# Patient Record
Sex: Female | Born: 1980 | Race: Black or African American | Hispanic: No | Marital: Single | Smoking: Former smoker
Health system: Southern US, Community
[De-identification: ages and names within clinical notes are randomized; demographics above are authoritative.]

## PROBLEM LIST (undated history)

## (undated) DIAGNOSIS — I10 Essential (primary) hypertension: Secondary | ICD-10-CM

## (undated) DIAGNOSIS — E785 Hyperlipidemia, unspecified: Secondary | ICD-10-CM

## (undated) DIAGNOSIS — G35 Multiple sclerosis: Secondary | ICD-10-CM

## (undated) DIAGNOSIS — E119 Type 2 diabetes mellitus without complications: Secondary | ICD-10-CM

## (undated) DIAGNOSIS — Z973 Presence of spectacles and contact lenses: Secondary | ICD-10-CM

## (undated) HISTORY — DX: Multiple sclerosis: G35

## (undated) HISTORY — PX: EYE SURGERY: SHX253

## (undated) HISTORY — DX: Essential (primary) hypertension: I10

---

## 2009-01-05 HISTORY — PX: EYE SURGERY: SHX253

## 2014-04-16 ENCOUNTER — Emergency Department (HOSPITAL_COMMUNITY)
Admission: EM | Admit: 2014-04-16 | Discharge: 2014-04-16 | Disposition: A | Discharge status: Home / Self Care | Payer: Medicaid Other | Source: Home / Self Care | Attending: Emergency Medicine | Admitting: Emergency Medicine

## 2014-04-16 ENCOUNTER — Encounter (HOSPITAL_COMMUNITY): Payer: Self-pay | Admitting: Emergency Medicine

## 2014-04-16 DIAGNOSIS — J111 Influenza due to unidentified influenza virus with other respiratory manifestations: Secondary | ICD-10-CM | POA: Diagnosis not present

## 2014-04-16 MED ORDER — OSELTAMIVIR PHOSPHATE 75 MG PO CAPS: 75.0000 mg | ORAL_CAPSULE | Freq: Two times a day (BID) | ORAL | Status: DC

## 2014-04-16 MED ORDER — ACETAMINOPHEN 325 MG PO TABS
650.0000 mg | ORAL_TABLET | Freq: Once | ORAL | Status: AC
  Administered 2014-04-16: 650 mg via ORAL

## 2014-04-16 MED ORDER — ACETAMINOPHEN 325 MG PO TABS
ORAL_TABLET | ORAL | Status: AC
  Filled 2014-04-16: qty 2

## 2014-04-16 NOTE — ED Notes (Signed)
Pt. Stated, It started yesterday with congestion, I thought it was just my sinuses.

## 2014-04-16 NOTE — Discharge Instructions (Signed)
You have the flu. Take Tamiflu twice a day for the next 5 days. Make sure you're getting plenty of fluids and rest. Alternate Tylenol and Motrin every 4 hours as needed for body aches and fever. Follow-up as needed.

## 2014-04-16 NOTE — ED Provider Notes (Signed)
CSN: [id number]     Arrival date & time 04/16/14  1719 History   First MD Initiated Contact with Patient 04/16/14 [place]     Chief Complaint  Patient presents with  . Influenza   (Consider location/radiation/quality/duration/timing/severity/associated sxs/prior Treatment) HPI  She is a 34 year old woman here for evaluation of fever. Her symptoms started yesterday with nasal congestion and rhinorrhea. Today, she has had fever, body aches, sore throat, cough, fatigue. She's had some mild nausea and one episode of vomiting this morning. No abdominal pain or diarrhea. Positive sick contacts.  History reviewed. No pertinent past medical history. History reviewed. No pertinent past surgical history. No family history on file. History  Substance Use Topics  . Smoking status: Never Smoker   . Smokeless tobacco: Not on file  . Alcohol Use: No   OB History    No data available     Review of Systems  Constitutional: Positive for fever and fatigue.  HENT: Positive for congestion, ear pain, rhinorrhea and sore throat.   Respiratory: Positive for cough. Negative for shortness of breath.   Cardiovascular: Negative for chest pain.  Gastrointestinal: Positive for nausea and vomiting. Negative for abdominal pain and diarrhea.  Musculoskeletal: Positive for myalgias.  Neurological: Positive for headaches.    Allergies  Review of patient's allergies indicates not on file.  Home Medications   Prior to Admission medications   Medication Sig Start Date End Date Taking? Authorizing Provider  oseltamivir (TAMIFLU) 75 MG capsule Take 1 capsule (75 mg total) by mouth every 12 (twelve) hours. 04/16/14   Melony Overly, MD   BP 145/84 mmHg  Pulse 116  Temp(Src) 101.9 F (38.8 C) (Oral)  Resp 20  SpO2 97%  LMP 04/04/2014 Physical Exam  Constitutional: She is oriented to person, place, and time. She appears well-developed and well-nourished. She appears distressed (appears fatigued).  HENT:  Right  Ear: Tympanic membrane normal.  Left Ear: Tympanic membrane normal.  Nose: Rhinorrhea present.  Mouth/Throat: Oropharynx is clear and moist. No oropharyngeal exudate.  Neck: Neck supple.  Cardiovascular: Regular rhythm and normal heart sounds.  Tachycardia present.   No murmur heard. Pulmonary/Chest: Effort normal and breath sounds normal. No respiratory distress. She has no wheezes. She has no rales.  Lymphadenopathy:    She has no cervical adenopathy.  Neurological: She is alert and oriented to person, place, and time.    ED Course  Procedures (including critical care time) Labs Review Labs Reviewed - No data to display  Imaging Review No results found.   MDM   1. Influenza    Tylenol 650 mg given for fever.  We'll treat with Tamiflu. Symptomatic care as in after visit summary. Follow-up as needed.    Melony Overly, MD 04/16/14 [phone]

## 2015-10-03 ENCOUNTER — Other Ambulatory Visit: Payer: Self-pay | Admitting: Family

## 2015-10-03 DIAGNOSIS — R29898 Other symptoms and signs involving the musculoskeletal system: Secondary | ICD-10-CM

## 2015-10-03 DIAGNOSIS — G8929 Other chronic pain: Secondary | ICD-10-CM

## 2015-10-03 DIAGNOSIS — M5441 Lumbago with sciatica, right side: Secondary | ICD-10-CM

## 2015-10-22 ENCOUNTER — Ambulatory Visit
Admission: RE | Admit: 2015-10-22 | Discharge: 2015-10-22 | Disposition: A | Discharge status: Home / Self Care | Payer: 59 | Source: Ambulatory Visit | Attending: Family | Admitting: Family

## 2015-10-22 DIAGNOSIS — M5441 Lumbago with sciatica, right side: Secondary | ICD-10-CM

## 2015-10-22 DIAGNOSIS — G8929 Other chronic pain: Secondary | ICD-10-CM

## 2015-10-22 DIAGNOSIS — R29898 Other symptoms and signs involving the musculoskeletal system: Secondary | ICD-10-CM

## 2016-01-31 ENCOUNTER — Encounter: Payer: Self-pay | Admitting: Neurology

## 2016-01-31 ENCOUNTER — Ambulatory Visit (INDEPENDENT_AMBULATORY_CARE_PROVIDER_SITE_OTHER): Payer: 59 | Admitting: Neurology

## 2016-01-31 ENCOUNTER — Other Ambulatory Visit: Payer: 59

## 2016-01-31 VITALS — BP 128/88 | HR 96 | Ht 63.0 in | Wt 197.4 lb

## 2016-01-31 DIAGNOSIS — R29898 Other symptoms and signs involving the musculoskeletal system: Secondary | ICD-10-CM | POA: Diagnosis not present

## 2016-01-31 DIAGNOSIS — R9082 White matter disease, unspecified: Secondary | ICD-10-CM

## 2016-01-31 DIAGNOSIS — R252 Cramp and spasm: Secondary | ICD-10-CM

## 2016-01-31 DIAGNOSIS — R292 Abnormal reflex: Secondary | ICD-10-CM | POA: Diagnosis not present

## 2016-01-31 DIAGNOSIS — R930 Abnormal findings on diagnostic imaging of skull and head, not elsewhere classified: Secondary | ICD-10-CM

## 2016-01-31 MED ORDER — BACLOFEN 10 MG PO TABS: 10.0000 mg | ORAL_TABLET | Freq: Two times a day (BID) | ORAL | 5 refills | Status: DC

## 2016-01-31 NOTE — Patient Instructions (Addendum)
1.  MRI cervical and brain wwo contrast 2.  Check blood work 3.  Start physical therapy  4.  Start baclofen 10mg  as follows:    Morning       Evening  Week 1          1/2 tab                                   Week 2 1/2 tab         1/2 tab                         Week 3 1/2 tab         1 tab                       Continue 1 tab            1/ tab                           Return to clinic in 2-3 months

## 2016-01-31 NOTE — Progress Notes (Signed)
[facility] Clinic Note - Initial Visit   Date: 01/31/16  Peggy Banks MRN: [id number] DOB: [birth year]   Dear Eloise Levels, NP:  Thank you for your kind referral of Peggy Banks for consultation of right leg weakness. Although her history is well known to you, please allow [place] to reiterate it for the purpose of our medical record. The patient was accompanied to the clinic by self.   History of Present Illness: Peggy Banks is a 36 y.o. right-handed female with hypertension presenting for evaluation of right leg stiffness and gait imbalance.    For the past 10 years, she has had a limp with her right leg and feels that is more effortful for her to move and often slaps the floor when she is walking.  Her leg also feels stiff and heavy.  She does not have muscle cramps.  There is numbness over the sole of the foot and involves the posterior lower calf.  Symptoms are worse with prolonged sitting.  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  Stretching sometimes helps.   Over the past 6 months, she became more dependent on using grocery carts because it helps maintain balance when she walks.  She is able to walk unassisted, but feels more comfortable leaning on objects.  She has also noticed that right toes on the right foot do not move and her foot tends to want to curl inwards.    She recalls having right radicular pain over her thigh during her pregnancy with her son, but her current symptoms are nothing like this. She endorses low back pain.  MRI lumbar spine did not show any nerve impingement.  She denies any numbness/tingling or weakness of the right face and arm.   No history of vision loss.    Upon further questioning, she was told ~2005 that there was abnormal change on her MRI for the same complaints and multiple sclerosis was mentioned, but she was never given the diagnosis.  She did not have CSF  testing.  She did see neurology for this but after she became pregnant, she was lost to follow-up.   Out-side paper records, electronic medical record, and images have been reviewed where available and summarized as:  MRI lumbar spine wo contrast 10/22/2015: 1. No stenosis or impingement to explain right-sided leg symptoms. 2. L4-5 degenerative facet arthropathy.  Past Medical History:  Diagnosis Date  . Hypertension     Past Surgical History:  Procedure Laterality Date  . CESAREAN SECTION       Medications:  Outpatient Encounter Prescriptions as of 01/31/2016  Medication Sig  . Cholecalciferol (VITAMIN D PO) Take 5,000 Units by mouth.  . Ferrous Gluconate-C-Folic Acid (IRON-C PO) Take by mouth.  Marland Kitchen lisinopril-hydrochlorothiazide (PRINZIDE,ZESTORETIC) 10-12.5 MG tablet Take 1 tablet by mouth daily.  . Multiple Vitamin (MULTIVITAMIN) tablet Take 1 tablet by mouth daily.  Marland Kitchen oseltamivir (TAMIFLU) 75 MG capsule Take 1 capsule (75 mg total) by mouth every 12 (twelve) hours.   No facility-administered encounter medications on file as of 01/31/2016.      Allergies:  Allergies  Allergen Reactions  . Penicillins     Family History: Family History  Problem Relation Age of Onset  . Hypothyroidism Mother   . Hypertension Mother   . Hypertension Father   . Healthy Sister   . Cancer Maternal Grandfather   . Stroke Paternal Grandmother   . Other  Paternal Grandfather     MVA    Social History: Social History  Substance Use Topics  . Smoking status: Never Smoker  . Smokeless tobacco: Never Used  . Alcohol use No     Comment: Occasional   Social History   Social History Narrative  . No narrative on file    Review of Systems:  CONSTITUTIONAL: No fevers, chills, night sweats, or weight loss.   EYES: No visual changes or eye pain ENT: No hearing changes.  No history of nose bleeds.   RESPIRATORY: No cough, wheezing and shortness of breath.   CARDIOVASCULAR: Negative for  chest pain, and palpitations.   GI: Negative for abdominal discomfort, blood in stools or black stools.  No recent change in bowel habits.   GU:  No history of incontinence.   MUSCLOSKELETAL: No history of joint pain or swelling.  No myalgias.   SKIN: Negative for lesions, rash, and itching.   HEMATOLOGY/ONCOLOGY: Negative for prolonged bleeding, bruising easily, and swollen nodes.  No history of cancer.   ENDOCRINE: Negative for cold or heat intolerance, polydipsia or goiter.   PSYCH:  No depression or anxiety symptoms.   NEURO: As Above.   Vital Signs:  BP 128/88   Pulse 96   Ht '5\' 3"'  (1.6 m)   Wt 197 lb 6 oz (89.5 kg)   SpO2 98%   BMI 34.96 kg/m    General Medical Exam:   General:  Well appearing, comfortable.   Eyes/ENT: see cranial nerve examination.   Neck: No masses appreciated.  Full range of motion without tenderness.  No carotid bruits. Respiratory:  Clear to auscultation, good air entry bilaterally.   Cardiac:  Regular rate and rhythm, no murmur.   Extremities:  Mild inversion of the right foot and toe flexion due to spasticity.  Skin:  No rashes or lesions.  Neurological Exam: MENTAL STATUS including orientation to time, place, person, recent and remote memory, attention span and concentration, language, and fund of knowledge is normal.  Speech is not dysarthric.  CRANIAL NERVES: II:  No visual field defects.  Unremarkable fundi.   III-IV-VI: Pupils equal round and reactive to light.  Normal conjugate, extra-ocular eye movements in all directions of gaze.  No nystagmus.  No ptosis.  V:  Normal facial sensation.  Jaw jerk is absent.   VII:  Normal facial symmetry and movements.  No pathologic facial reflexes.  VIII:  Normal hearing and vestibular function.   IX-X:  Normal palatal movement.   XI:  Normal shoulder shrug and head rotation.   XII:  Normal tongue strength and range of motion, no deviation or fasciculation.  MOTOR:  No atrophy, fasciculations or  abnormal movements.  No pronator drift.  Right foot with spastic deformity causing foot flexion, toe flexion, and inversion.  Right Upper Extremity:    Left Upper Extremity:    Deltoid  5/5   Deltoid  5/5   Biceps  5/5   Biceps  5/5   Triceps  5/5   Triceps  5/5   Wrist extensors  5/5   Wrist extensors  5/5   Wrist flexors  5/5   Wrist flexors  5/5   Finger extensors  5/5   Finger extensors  5/5   Finger flexors  5/5   Finger flexors  5/5   Dorsal interossei  5/5   Dorsal interossei  5/5   Abductor pollicis  5/5   Abductor pollicis  5/5   Tone (Ashworth scale)  0+  Tone (Ashworth scale)  0   Right Lower Extremity:    Left Lower Extremity:    Hip flexors  5/5   Hip flexors  5/5   Hip extensors  5/5   Hip extensors  5/5   Knee flexors  5/5   Knee flexors  5/5   Knee extensors  5/5   Knee extensors  5/5   Inversion 5/5  Inversion 5/5  Eversion 2/5  Eversion 5/5  Dorsiflexors  5/5   Dorsiflexors  5/5   Plantarflexors  5/5   Plantarflexors  5/5   Toe extensors  1/5   Toe extensors  5/5   Toe flexors  1/5   Toe flexors  5/5   Tone (Ashworth scale)  1+  Tone (Ashworth scale)  1+   MSRs:  Right                                                                 Left brachioradialis 3+  brachioradialis 2+  biceps 3+  biceps 2+  triceps 3+  triceps 2+  patellar 3+  patellar 3+  ankle jerk 3+  ankle jerk 2+  Hoffman yes  Hoffman no  plantar response down  plantar response down  1-2 beat ankle clonus  SENSORY:  Vibration is absent at the toes bilaterally.  Temperature is reduced over the dorsum of the feet.  Pin prick is intact.   COORDINATION/GAIT: Normal finger-to- nose-finger.  Intact rapid alternating movements bilaterally with finger tapping.  Heel and toe tapping on the right is slowed.  Spastic gait with right foot in flexion and mildly inverted and minimal knee flexion causing circumduction of the leg.     IMPRESSION: Right leg spasticity due to likely demyelinating  disease/multiple sclerosis.  She has previously been told that her MRI brain (2005) shows demyelinating plaques, however, I do not have these reports or images to review. Patient had a copy of this at home and will bring for me to review personally.  With her increased tone and hyperreflexia in the RUE, the possibility of multiple sclerosis is a high likelihood.  MRI brain and cervical spine wwo contrast will be ordered to assess disease burden and exclude other etiologies.  From a symptom standpoint, she needs management for spasticity.  I will try muscle relaxants and PT first, if no improvement, we will discuss chemodenervation with Botox.   PLAN/RECOMMENDATIONS:  1.  MRI brain and cervical spine wwo contrast 2.  Check ESR, vitamin D, ANA 3.  Start physical therapy for right leg stretching 4.  Start baclofen 51m at bedtime and titrate to 16mtwice daily.  Side effects discussed and titration schedule provided.  Return to clinic in 2 months.   The duration of this appointment visit was 60 minutes of face-to-face time with the patient.  Greater than 50% of this time was spent in counseling, explanation of diagnosis, planning of further management, and coordination of care.   Thank you for allowing me to participate in patient's care.  If I can answer any additional questions, I would be pleased to do so.    Sincerely,    Donika K. PaPosey ProntoDO

## 2016-01-31 NOTE — Addendum Note (Signed)
Addended by: Chester Holstein on: 01/31/2016 02:47 PM   Modules accepted: Orders

## 2016-02-01 LAB — ANA: ANA: NEGATIVE

## 2016-02-01 LAB — VITAMIN D 25 HYDROXY (VIT D DEFICIENCY, FRACTURES): Vit D, 25-Hydroxy: 41.8 ng/mL (ref 30.0–100.0)

## 2016-02-01 LAB — SEDIMENTATION RATE: SED RATE: 41 mm/h — AB (ref 0–32)

## 2016-02-03 ENCOUNTER — Telehealth: Payer: Self-pay | Admitting: *Deleted

## 2016-02-03 NOTE — Telephone Encounter (Signed)
Patient given results

## 2016-02-03 NOTE — Telephone Encounter (Signed)
-----   Message from Alda Berthold, DO sent at 02/03/2016  9:17 AM EST ----- Please inform patient that her inflammatory marker is mildly elevated. This is a very nonspecific finding can be seen in various autoimmune/inflammatory conditions.  Remains labs are normal.  We will await the results of her MRI and decide the next step.  Thanks.

## 2016-02-05 ENCOUNTER — Telehealth: Payer: Self-pay | Admitting: Neurology

## 2016-02-05 NOTE — Telephone Encounter (Signed)
Peggy Banks [birth year]. Her # [id number]. She is wanting know if labs were sent to [employer] (she works for them). She also dropped off MRI disc. That is her only one. She would like it back at her next visit please. Thank you

## 2016-02-06 NOTE — Telephone Encounter (Signed)
See previous telephone encounter (1/29) for lab results.  The labs were probably sent to [facility] unless she specifically mentioned that they needed to go to [place] - please look into this.  MRI disc is ready for pick-up or we can mail it, if she wants it sooner than her next appointment.

## 2016-02-06 NOTE — Telephone Encounter (Signed)
Lab results please

## 2016-02-06 NOTE — Telephone Encounter (Signed)
MRI brain personally reviewed dated 02/23/2006 which shows extensive scatter T2 hyperintensities in the subcortical and periventricular regions, with a larger confluent area on the left frontal horn and right posterior horn of the lateral ventricle.  There is also involvement of the corpus callosum. There is small foci of enhancement involving the subcortical parito-occiptal region.  These findings are most suggestive of multiple sclerosis.  Repeat MRI brian is pending and will guide options for disease modifying therapy.  Donika K. Posey Pronto, DO

## 2016-02-07 ENCOUNTER — Ambulatory Visit
Admission: RE | Admit: 2016-02-07 | Discharge: 2016-02-07 | Disposition: A | Discharge status: Home / Self Care | Payer: 59 | Source: Ambulatory Visit | Attending: Neurology | Admitting: Neurology

## 2016-02-07 DIAGNOSIS — R252 Cramp and spasm: Secondary | ICD-10-CM

## 2016-02-07 DIAGNOSIS — R29898 Other symptoms and signs involving the musculoskeletal system: Secondary | ICD-10-CM

## 2016-02-07 DIAGNOSIS — R9082 White matter disease, unspecified: Secondary | ICD-10-CM

## 2016-02-07 DIAGNOSIS — R292 Abnormal reflex: Secondary | ICD-10-CM

## 2016-02-07 MED ORDER — GADOBENATE DIMEGLUMINE 529 MG/ML IV SOLN
20.0000 mL | Freq: Once | INTRAVENOUS | Status: AC | PRN
  Administered 2016-02-07: 18 mL via INTRAVENOUS

## 2016-02-10 ENCOUNTER — Telehealth: Payer: Self-pay | Admitting: Neurology

## 2016-02-10 NOTE — Telephone Encounter (Signed)
Called and informed patient that her MRI brain and cervical spine shows extensive white matter changes, most consistent with multiple sclerosis.  There are four foci of enhancement.  Diagnosis was discussed and acute management options.  I will treat her with Solumedrol 1g x 3 days for acute exacerbation.  Side effects discussed.    We will discuss management options for disease-modifying therapies as well as spasticity management at her follow-up visit with me.   Donika K. Posey Pronto, DO

## 2016-02-11 ENCOUNTER — Other Ambulatory Visit: Payer: Self-pay | Admitting: *Deleted

## 2016-02-11 ENCOUNTER — Telehealth: Payer: Self-pay | Admitting: Neurology

## 2016-02-11 DIAGNOSIS — G35 Multiple sclerosis: Secondary | ICD-10-CM

## 2016-02-11 NOTE — Telephone Encounter (Signed)
Patient given # to short stay to reschedule.

## 2016-02-11 NOTE — Telephone Encounter (Signed)
Called patient to let her know that I have set up the steroid infusions for Monday, 02-17-16 at 12:00.

## 2016-02-11 NOTE — Telephone Encounter (Signed)
Peggy Banks September 12, 1980. She has a schedule conflict and needs it for wed, thurs, Friday for her steroid treatments. Next week she has a lot going on with her son and his appointments. Her # [id number]. Thank you

## 2016-02-13 ENCOUNTER — Telehealth: Payer: Self-pay | Admitting: Neurology

## 2016-02-13 NOTE — Telephone Encounter (Signed)
Mitsue Balasubramanian 1980-06-12 She was calling to check on her paper work that she dropped off yesterday 02/12/16. Her # is [phone] Thank you

## 2016-02-14 NOTE — Telephone Encounter (Signed)
Patient notified papers are ready.  I faxed them in and will mail the originals to her.

## 2016-02-17 ENCOUNTER — Encounter (HOSPITAL_COMMUNITY): Payer: 59

## 2016-02-18 ENCOUNTER — Other Ambulatory Visit (HOSPITAL_COMMUNITY): Payer: Self-pay | Admitting: *Deleted

## 2016-02-18 ENCOUNTER — Encounter (HOSPITAL_COMMUNITY): Payer: 59

## 2016-02-19 ENCOUNTER — Ambulatory Visit (HOSPITAL_COMMUNITY)
Admission: RE | Admit: 2016-02-19 | Discharge: 2016-02-19 | Disposition: A | Discharge status: Home / Self Care | Payer: 59 | Source: Ambulatory Visit | Attending: Neurology | Admitting: Neurology

## 2016-02-19 DIAGNOSIS — I10 Essential (primary) hypertension: Secondary | ICD-10-CM | POA: Insufficient documentation

## 2016-02-19 DIAGNOSIS — G35 Multiple sclerosis: Secondary | ICD-10-CM | POA: Insufficient documentation

## 2016-02-19 MED ORDER — METHYLPREDNISOLONE SODIUM SUCC 1000 MG IJ SOLR
1000.0000 mg | Freq: Every day | INTRAMUSCULAR | Status: DC
  Filled 2016-02-19: qty 8

## 2016-02-19 MED ORDER — SODIUM CHLORIDE 0.9 % IV SOLN
1000.0000 mg | Freq: Once | INTRAVENOUS | Status: AC
  Administered 2016-02-19: 1000 mg via INTRAVENOUS
  Filled 2016-02-19: qty 8

## 2016-02-19 NOTE — Discharge Instructions (Signed)
Methylprednisolone Solution for Injection What is this medicine? METHYLPREDNISOLONE (meth ill pred NISS oh lone) is a corticosteroid. It is commonly used to treat inflammation of the skin, joints, lungs, and other organs. Common conditions treated include asthma, allergies, and arthritis. It is also used for other conditions, such as blood disorders and diseases of the adrenal glands. This medicine may be used for other purposes; ask your health care provider or pharmacist if you have questions. COMMON BRAND NAME(S): A-Methapred, Solu-Medrol What should I tell my health care provider before I take this medicine? They need to know if you have any of these conditions: -Cushing's syndrome -eye disease, vision problems -diabetes -glaucoma -heart disease -high blood pressure -infection (especially a virus infection such as chickenpox, cold sores, or herpes) -liver disease -mental illness -myasthenia gravis -osteoporosis -recently received or scheduled to receive a vaccine -seizures -stomach or intestine problems -thyroid disease -an unusual or allergic reaction to lactose, methylprednisolone, other medicines, foods, dyes, or preservatives -pregnant or trying to get pregnant -breast-feeding How should I use this medicine? This medicine is for injection or infusion into a vein. It is also for injection into a muscle. It is given by a health care professional in a hospital or clinic setting. Talk to your pediatrician regarding the use of this medicine in children. While this drug may be prescribed for selected conditions, precautions do apply. Overdosage: If you think you have taken too much of this medicine contact a poison control center or emergency room at once. NOTE: This medicine is only for you. Do not share this medicine with others. What if I miss a dose? This does not apply. What may interact with this medicine? Do not take this medicine with any of the following  medications: -alefacept -echinacea -iopamidol -live virus vaccines -metyrapone -mifepristone This medicine may also interact with the following medications: -amphotericin B -aspirin and aspirin-like medicines -certain antibiotics like erythromycin, clarithromycin, troleandomycin -certain medicines for diabetes -certain medicines for fungal infection like ketoconazole -certain medicines for seizures like carbamazepine, phenobarbital, phenytoin -certain medicines that treat or prevent blood clots like warfarin -cyclosporine -digoxin -diuretics -female hormones, like estrogens and birth control pills -isoniazid -NSAIDS, medicines for pain and inflammation, like ibuprofen or naproxen -other medicines for myasthenia gravis -rifampin -vaccines This list may not describe all possible interactions. Give your health care provider a list of all the medicines, herbs, non-prescription drugs, or dietary supplements you use. Also tell them if you smoke, drink alcohol, or use illegal drugs. Some items may interact with your medicine. What should I watch for while using this medicine? Tell your doctor or healthcare professional if your symptoms do not start to get better or if they get worse. Do not stop taking except on your doctor's advice. You may develop a severe reaction. Your doctor will tell you how much medicine to take. Your condition will be monitored carefully while you are receiving this medicine. This medicine may increase your risk of getting an infection. Tell your doctor or health care professional if you are around anyone with measles or chickenpox, or if you develop sores or blisters that do not heal properly. This medicine may affect blood sugar levels. If you have diabetes, check with your doctor or health care professional before you change your diet or the dose of your diabetic medicine. Tell your doctor or health care professional right away if you have any change in your  eyesight. Using this medicine for a long time may increase your risk of low bone  mass. Talk to your doctor about bone health. What side effects may I notice from receiving this medicine? Side effects that you should report to your doctor or health care professional as soon as possible: -allergic reactions like skin rash, itching or hives, swelling of the face, lips, or tongue -bloody or tarry stools -changes in vision -hallucination, loss of contact with reality -muscle cramps -muscle pain -palpitations -signs and symptoms of high blood sugar such as dizziness; dry mouth; dry skin; fruity breath; nausea; stomach pain; increased hunger or thirst; increased urination -signs and symptoms of infection like fever or chills; cough; sore throat; pain or trouble passing urine -trouble passing urine or change in the amount of urine Side effects that usually do not require medical attention (report to your doctor or health care professional if they continue or are bothersome): -changes in emotions or mood -constipation -diarrhea -excessive hair growth on the face or body -headache -nausea, vomiting -pain, redness, or irritation at site where injected -trouble sleeping -weight gain This list may not describe all possible side effects. Call your doctor for medical advice about side effects. You may report side effects to FDA at 1-800-FDA-1088. Where should I keep my medicine? This drug is given in a hospital or clinic and will not be stored at home. NOTE: This sheet is a summary. It may not cover all possible information. If you have questions about this medicine, talk to your doctor, pharmacist, or health care provider.  2017 Elsevier/Gold Standard (2015-02-28 16:21:28)

## 2016-02-20 ENCOUNTER — Encounter (HOSPITAL_COMMUNITY)
Admission: RE | Admit: 2016-02-20 | Discharge: 2016-02-20 | Disposition: A | Discharge status: Home / Self Care | Payer: 59 | Source: Ambulatory Visit | Attending: Neurology | Admitting: Neurology

## 2016-02-20 DIAGNOSIS — I10 Essential (primary) hypertension: Secondary | ICD-10-CM | POA: Diagnosis not present

## 2016-02-20 DIAGNOSIS — G35 Multiple sclerosis: Secondary | ICD-10-CM | POA: Insufficient documentation

## 2016-02-20 MED ORDER — SODIUM CHLORIDE 0.9 % IV SOLN
1000.0000 mg | Freq: Once | INTRAVENOUS | Status: DC
  Administered 2016-02-20: 1000 mg via INTRAVENOUS
  Filled 2016-02-20: qty 8

## 2016-02-21 ENCOUNTER — Ambulatory Visit (HOSPITAL_COMMUNITY)
Admission: RE | Admit: 2016-02-21 | Discharge: 2016-02-21 | Disposition: A | Discharge status: Home / Self Care | Payer: 59 | Source: Ambulatory Visit | Attending: Neurology | Admitting: Neurology

## 2016-02-21 DIAGNOSIS — G35 Multiple sclerosis: Secondary | ICD-10-CM | POA: Insufficient documentation

## 2016-02-21 MED ORDER — SODIUM CHLORIDE 0.9 % IV SOLN
1000.0000 mg | Freq: Once | INTRAVENOUS | Status: AC
  Administered 2016-02-21: 1000 mg via INTRAVENOUS
  Filled 2016-02-21: qty 8

## 2016-02-27 ENCOUNTER — Encounter: Payer: Self-pay | Admitting: Neurology

## 2016-02-27 ENCOUNTER — Ambulatory Visit (INDEPENDENT_AMBULATORY_CARE_PROVIDER_SITE_OTHER): Payer: 59 | Admitting: Neurology

## 2016-02-27 VITALS — BP 120/80 | HR 86 | Ht 63.0 in | Wt 195.4 lb

## 2016-02-27 DIAGNOSIS — G35 Multiple sclerosis: Secondary | ICD-10-CM

## 2016-02-27 DIAGNOSIS — G35A Relapsing-remitting multiple sclerosis: Secondary | ICD-10-CM | POA: Insufficient documentation

## 2016-02-27 NOTE — Patient Instructions (Addendum)
1.  Please review information on Gilenya and Tysabri 2.  Continue baclofen 10mg  twice daily 3.  MRI thoracic spine wwo contrast 4.  NCS/EMG right arm 5.  Continue vitamin D 5000 units 6.  Please have your eyes checked  Return to clinic in 4 weeks

## 2016-02-27 NOTE — Progress Notes (Signed)
Follow-up Visit   Date: 02/27/16    Jacobi Scorza MRN: [id number] DOB: Jul 08, 1980   Interim History: [place] Kosar is a 36 y.o. right-handed African American female with hypertension returning to the clinic for follow-up of newly diagnosed multiple sclerosis.  The patient was accompanied to the clinic by self.    History of present illness: For the past 10 years, she has had a limp with her right leg and feels that is more effortful for her to move and often slaps the floor when she is walking.  Her leg also feels stiff and heavy.  She does not have muscle cramps.  There is numbness over the sole of the foot and involves the posterior lower calf.  Symptoms are worse with prolonged sitting.  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  Stretching sometimes helps.   Over the past 6 months, she became more dependent on using grocery carts because it helps maintain balance when she walks.  She is able to walk unassisted, but feels more comfortable leaning on objects.  She has also noticed that right toes on the right foot do not move and her foot tends to want to curl inwards.    She recalls having right radicular pain over her thigh during her pregnancy with her son, but her current symptoms are nothing like this. She endorses low back pain.  MRI lumbar spine did not show any nerve impingement.  She denies any numbness/tingling or weakness of the right face and arm.   No history of vision loss.    Upon further questioning, she was told ~2005 that there was abnormal change on her MRI for the same complaints and multiple sclerosis was mentioned, but she was never given the diagnosis.  She did not have CSF testing.  She did see neurology for this but after she became pregnant, many of her symptoms improve, and she was lost to follow-up.   UPDATE 02/27/2016:  Patient had MRI brain and cervical spine which showed diffuse white matter changes  affecting the brain as well as cervical cord, consistent with multiple sclerosis.  There are 4 small foci of enhancement. She was treated with 3 days of Solu-Medrol, but did not notice any marked change. She does mention having changes in her taste for a week following her infusion.  Upon further questioning about her history of neurological symptoms, she feels that her symptoms have always come and gone such as tingling of the legs and right hand.  She spends a lot of time at the computer for and is concerned her right hand tingling may be stemming from carpal tunnel syndrome. She has been tested for this. Since starting baclofen, her muscle stiffness and cramps have reduced, especially in the right leg.  Unfortunately, her gait remains unchanged and is still unsteady, with dragging of the right foot.  She also complains of tight band sensation around her rib cage and into her abdomen. She denies any shooting pain with neck flexion.  Medications:   Current Outpatient Prescriptions on File Prior to Visit  Medication Sig Dispense Refill  . baclofen (LIORESAL) 10 MG tablet Take 1 tablet (10 mg total) by mouth 2 (two) times daily. 60 tablet 5  . Cholecalciferol (VITAMIN D PO) Take 5,000 Units by mouth.    . Ferrous Gluconate-C-Folic Acid (IRON-C PO) Take by mouth.    Marland Kitchen lisinopril-hydrochlorothiazide (PRINZIDE,ZESTORETIC) 10-12.5 MG tablet Take 1  tablet by mouth daily.    . Multiple Vitamin (MULTIVITAMIN) tablet Take 1 tablet by mouth daily.    Marland Kitchen oseltamivir (TAMIFLU) 75 MG capsule Take 1 capsule (75 mg total) by mouth every 12 (twelve) hours. 10 capsule 0   No current facility-administered medications on file prior to visit.     Allergies:  Allergies  Allergen Reactions  . Penicillins     Review of Systems:  CONSTITUTIONAL: No fevers, chills, night sweats, or weight loss.  EYES: No visual changes or eye pain ENT: No hearing changes.  No history of nose bleeds.   RESPIRATORY: No cough, wheezing  and shortness of breath.   CARDIOVASCULAR: Negative for chest pain, and palpitations.   GI: Negative for abdominal discomfort, blood in stools or black stools.  No recent change in bowel habits.   GU:  No history of incontinence.   MUSCLOSKELETAL: No history of joint pain or swelling.  No myalgias.   SKIN: Negative for lesions, rash, and itching.   ENDOCRINE: Negative for cold or heat intolerance, polydipsia or goiter.   PSYCH:  No depression or anxiety symptoms.   NEURO: As Above.   Vital Signs:  BP 120/80   Pulse 86   Ht 5\' 3"  (1.6 m)   Wt 195 lb 6 oz (88.6 kg)   SpO2 98%   BMI 34.61 kg/m   Neurological Exam: MENTAL STATUS including orientation to time, place, person, recent and remote memory, attention span and concentration, language, and fund of knowledge is normal.  Speech is not dysarthric.  CRANIAL NERVES: No visual field defects.  Pupils equal round and reactive to light.  Normal conjugate, extra-ocular eye movements in all directions of gaze.  No ptosis. Normal facial sensation.  Face is symmetric. Palate elevates symmetrically.  Tongue is midline.  MOTOR:  Motor strength is 5/5 in all extremities, except right eversion 2/5, toe extension and flexion 1/5.   There is right Achilles tendon contracture causing foot flexion, toe flexion, and inversion.  I am unable to appreciate much spasticity in the foot.   tone is normal in the right upper extremity (improved); although spasticity in the left leg is improved (0+), the right leg continues to be spastic (1+).  MSRs:  Right                                                                 Left brachioradialis 3+  brachioradialis 2+  biceps 3+  biceps 2+  triceps 3+  triceps 2+  patellar 3+  patellar 3+  ankle jerk 3+  ankle jerk 2+  Hoffman yes  Hoffman no  plantar response down  plantar response down  No clonus (improved)  SENSORY: Vibration is absent at the toes bilaterally  COORDINATION/GAIT:  Normal finger-to-  nose-finger. Spastic gait with right foot in flexion and mildly inverted and hyperextension of the knee.  She walks unassisted.   Data: MRI brain personally reviewed dated 02/23/2006 which shows extensive scatter T2 hyperintensities in the subcortical and periventricular regions, with a larger confluent area on the left frontal horn and right posterior horn of the lateral ventricle.  There is also involvement of the corpus callosum. There is small foci of enhancement involving the subcortical parito-occiptal region.  MRI brain and cervical spine with  and without contrast 02/07/2016:  Findings consistent with multiple sclerosis. Disease is extensive throughout the brain and cervical cord and there is white matter atrophy. Four foci of enhancement/active demyelination are noted.   IMPRESSION/PLAN: Mrs. Durocher is a 36 year old female returning for follow-up of newly diagnosed multiple sclerosis. Symptom onset was in 2005 with right leg spasticity and gait difficulties, however she was lost to follow-up and did not undergo neurological evaluation at that time. She started seeing me in January 2018 for these complaints and imaging confirmed multiple sclerosis with disease burden involving juxtacortical, periventricular, brainstem, and cerebellum as well as short segment demyelinating changes affecting nearly every level of the cervical spine.   I had an extensive discussion regarding the diagnosis of relapsing remitting MS. However, with the extensive nature of her disease progression, progressive MS and not be excluded. I would like to treat her with disease modifying therapies for relapsing remitting multiple sclerosis to prevent further neurological disability. With her cervical cord involvement, I aggressive therapy with Tysabri. Alternatively, oral agents were also discussed.  She was informed of the risks and benefits of these medications, including the rare risk of PML. Literature on these medications was  provided to the patient. I will allow her to review this data and follow up with her in 2 weeks to initiate medical treatment.  1.  Information on Gilenya and Tysabri provided 2.  Continue baclofen 10mg  twice daily 3.  MRI thoracic spine wwo contrast 4.  NCS/EMG right arm to evaluate for CTS 5.  Start physical therapy for right knee hyperextension and right foot spasticity.  6   Continue vitamin D 5000 units 7.  Recommend eye exam 8.  Follow-up in 2 weeks with decision on disease modifying therapy  Return to clinic in 4-6 weeks.    The duration of this appointment visit was 45 minutes of face-to-face time with the patient.  Greater than 50% of this time was spent in counseling, explanation of diagnosis, planning of further management, and coordination of care.   Thank you for allowing me to participate in patient's care.  If I can answer any additional questions, I would be pleased to do so.    Sincerely,    Koni Kannan K. Posey Pronto, DO

## 2016-03-10 ENCOUNTER — Ambulatory Visit
Admission: RE | Admit: 2016-03-10 | Discharge: 2016-03-10 | Disposition: A | Discharge status: Home / Self Care | Payer: 59 | Source: Ambulatory Visit | Attending: Neurology | Admitting: Neurology

## 2016-03-10 DIAGNOSIS — G35 Multiple sclerosis: Secondary | ICD-10-CM

## 2016-03-10 MED ORDER — GADOBENATE DIMEGLUMINE 529 MG/ML IV SOLN
18.0000 mL | Freq: Once | INTRAVENOUS | Status: AC | PRN
  Administered 2016-03-10: 18 mL via INTRAVENOUS

## 2016-03-13 ENCOUNTER — Telehealth: Payer: Self-pay | Admitting: *Deleted

## 2016-03-13 NOTE — Telephone Encounter (Signed)
-----   Message from Alda Berthold, DO sent at 03/13/2016  2:44 PM EST ----- Please inform patient that there is evidence of multiple sclerosis affecting the thoracic cord which explains her band like sensation over the abdomen.  Did you have time to review the information on Tysabri?  If she would like to proceed, we will need to complete paperwork at her next visit on 3/23.

## 2016-03-13 NOTE — Telephone Encounter (Signed)
I gave patient the results and she has agreed to start tysabri.  We will have her sign papers at next visit.

## 2016-03-27 ENCOUNTER — Other Ambulatory Visit: Payer: 59

## 2016-03-27 ENCOUNTER — Encounter: Payer: Self-pay | Admitting: Neurology

## 2016-03-27 ENCOUNTER — Ambulatory Visit (INDEPENDENT_AMBULATORY_CARE_PROVIDER_SITE_OTHER): Payer: 59 | Admitting: Neurology

## 2016-03-27 VITALS — BP 130/88 | HR 104 | Ht 63.0 in | Wt 194.5 lb

## 2016-03-27 DIAGNOSIS — G35 Multiple sclerosis: Secondary | ICD-10-CM

## 2016-03-27 MED ORDER — AMBULATORY NON FORMULARY MEDICATION: 1.0000 [IU] | Freq: Every day | 0 refills | Status: DC

## 2016-03-27 NOTE — Progress Notes (Signed)
Follow-up Visit   Date: 03/27/16    Peggy Banks MRN: [id number] DOB: 1980-03-27   Interim History: Peggy Banks is a 36 y.o. right-handed African American female with hypertension returning to the clinic for follow-up of newly diagnosed multiple sclerosis.  The patient was accompanied to the clinic by self.    History of present illness: For the past 10 years, she has had a limp with her right leg and feels that is more effortful for her to move and often slaps the floor when she is walking.  Her leg also feels stiff and heavy.  She does not have muscle cramps.  There is numbness over the sole of the foot and involves the posterior lower calf.  Symptoms are worse with prolonged sitting.  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  Stretching sometimes helps.   Over the past 6 months, she became more dependent on using grocery carts because it helps maintain balance when she walks.  She is able to walk unassisted, but feels more comfortable leaning on objects.  She has also noticed that right toes on the right foot do not move and her foot tends to want to curl inwards.    She recalls having right radicular pain over her thigh during her pregnancy with her son, but her current symptoms are nothing like this. She endorses low back pain.  MRI lumbar spine did not show any nerve impingement.  She denies any numbness/tingling or weakness of the right face and arm.   No history of vision loss.    Upon further questioning, she was told ~2005 that there was abnormal change on her MRI for the same complaints and multiple sclerosis was mentioned, but she was never given the diagnosis.  She did not have CSF testing.  She did see neurology for this but after she became pregnant, many of her symptoms improve, and she was lost to follow-up.   UPDATE 02/27/2016:  Patient had MRI brain and cervical spine which showed diffuse white matter changes  affecting the brain as well as cervical cord, consistent with multiple sclerosis.  There are 4 small foci of enhancement. She was treated with 3 days of Solu-Medrol, but did not notice any marked change. She does mention having changes in her taste for a week following her infusion.  Upon further questioning about her history of neurological symptoms, she feels that her symptoms have always come and gone such as tingling of the legs and right hand.  She spends a lot of time at the computer for and is concerned her right hand tingling may be stemming from carpal tunnel syndrome. Since starting baclofen, her muscle stiffness and cramps have reduced, especially in the right leg.  Unfortunately, her gait remains unchanged and is still unsteady, with dragging of the right foot.  She also complains of tight band sensation around her rib cage and into her abdomen. She denies any shooting pain with neck flexion.  UPDATE 03/27/2016:  She is here to discuss DMTs and is interested in starting Tysabri.  Much of today's visit was spent discussing the medication, side effects, and expectation.  No new complaints today.  She feels that since getting steroids, some days are better with respect to her right foot tightness, but the foot continues to drag when walking.     Medications:   Current Outpatient Prescriptions on File Prior to Visit  Medication Sig  Dispense Refill  . baclofen (LIORESAL) 10 MG tablet Take 1 tablet (10 mg total) by mouth 2 (two) times daily. 60 tablet 5  . Cholecalciferol (VITAMIN D PO) Take 5,000 Units by mouth.    . Ferrous Gluconate-C-Folic Acid (IRON-C PO) Take by mouth.    Marland Kitchen lisinopril-hydrochlorothiazide (PRINZIDE,ZESTORETIC) 10-12.5 MG tablet Take 1 tablet by mouth daily.    . Multiple Vitamin (MULTIVITAMIN) tablet Take 1 tablet by mouth daily.    Marland Kitchen oseltamivir (TAMIFLU) 75 MG capsule Take 1 capsule (75 mg total) by mouth every 12 (twelve) hours. 10 capsule 0   No current  facility-administered medications on file prior to visit.     Allergies:  Allergies  Allergen Reactions  . Penicillins     Review of Systems:  CONSTITUTIONAL: No fevers, chills, night sweats, or weight loss.  EYES: No visual changes or eye pain ENT: No hearing changes.  No history of nose bleeds.   RESPIRATORY: No cough, wheezing and shortness of breath.   CARDIOVASCULAR: Negative for chest pain, and palpitations.   GI: Negative for abdominal discomfort, blood in stools or black stools.  No recent change in bowel habits.   GU:  No history of incontinence.   MUSCLOSKELETAL: No history of joint pain or swelling.  No myalgias.   SKIN: Negative for lesions, rash, and itching.   ENDOCRINE: Negative for cold or heat intolerance, polydipsia or goiter.   PSYCH:  No depression or anxiety symptoms.   NEURO: As Above.   Vital Signs:  BP 130/88   Pulse (!) 104   Ht 5\' 3"  (1.6 m)   Wt 194 lb 8 oz (88.2 kg)   SpO2 98%   BMI 34.45 kg/m   Neurological Exam: MENTAL STATUS including orientation to time, place, person, recent and remote memory, attention span and concentration, language, and fund of knowledge is normal.  Speech is not dysarthric.  CRANIAL NERVES:  Face is symmetric.  MOTOR:  Motor strength is 5/5 in all extremities, except right foot dorsiflexion 4+/5, eversion 2/5, inversion 4/5, toe extension and flexion 1/5.   There is right Achilles tendon contracture causing foot flexion, toe flexion, and inversion.  I am unable to appreciate much spasticity in the foot.  Tone is normal in the right upper extremity (improved); although spasticity in the left leg is improved (0+), the right leg continues to be spastic (1).  MSRs:  Right                                                                 Left brachioradialis 3+  brachioradialis 2+  biceps 3+  biceps 2+  triceps 3+  triceps 2+  patellar 3+  patellar 3+  ankle jerk 3+  ankle jerk 2+  Hoffman yes  Hoffman no  plantar  response down  plantar response down  No clonus (improved)  SENSORY: Vibration is absent at the toes bilaterally  COORDINATION/GAIT:   Gait is improved with less spasticity, she continues to have right foot dragging and inversion, with hyperextension at the knee   Data: MRI brain personally reviewed dated 02/23/2006 which shows extensive scatter T2 hyperintensities in the subcortical and periventricular regions, with a larger confluent area on the left frontal horn and right posterior horn of the lateral ventricle.  There is also involvement of the corpus callosum. There is small foci of enhancement involving the subcortical parito-occiptal region.  MRI brain and cervical spine with and without contrast 02/07/2016:  Findings consistent with multiple sclerosis. Disease is extensive throughout the brain and cervical cord and there is white matter atrophy. Four foci of enhancement/active demyelination are noted.  MRI thoracic spine wwo contrast 03/10/2016:  1. Widespread severe thoracic spinal cord signal abnormality compatible with chronic demyelinating disease. No areas of acute thoracic cord demyelination are identified. 2. Isolated small right paracentral thoracic disc herniation at T10-T11. Borderline to mild associated spinal stenosis at that level.   IMPRESSION/PLAN: Mrs. Zaragoza is a 36 year old female returning for follow-up of multiple sclerosis. Symptom onset was in 2005 with right leg spasticity and gait difficulties, however she was lost to follow-up and did not undergo neurological evaluation at that time. She started seeing me in January 2018 for these complaints and imaging confirmed multiple sclerosis with disease burden involving juxtacortical, periventricular, brainstem, and cerebellum as well as short segment demyelinating changes affecting nearly every level of the cervical and thoracic spine.   I had an extensive discussion regarding the diagnosis of relapsing remitting MS. However, with  the extensive nature of her disease progression, progressive MS and not be excluded. I would like to treat her with disease modifying therapies for relapsing remitting multiple sclerosis to prevent further neurological disability.   Disease modifying therapies were discussed and with her cord involvement, aggressive therapy with Tysabri will be started.  She was informed of the risks and benefits of these medications, including the rare risk of PML. Literature on these medications was provided to the patient.   1.  Will initiate PA to start monthly Tysabri infusions.  Risks and benefits discussed.  2.  Check JCV antibody, CBC, and CMP.   3.  Continue baclofen 10mg  twice daily, with her foot weakness, I do not think she would be a good candidate for botox 4.  Referral for orthotics to be fit for right AFO 5.  Continue physical therapy for right knee hyperextension and right foot spasticity.  6.  Continue vitamin D 5000 units  Return to clinic in 2 months.    The duration of this appointment visit was 30 minutes of face-to-face time with the patient.  Greater than 50% of this time was spent in counseling, explanation of diagnosis, planning of further management, and coordination of care.   Thank you for allowing me to participate in patient's care.  If I can answer any additional questions, I would be pleased to do so.    Sincerely,    Donika K. Posey Pronto, DO

## 2016-03-27 NOTE — Patient Instructions (Addendum)
1.  Check Labs 2.  Referral to be fit for right ankle foot orthotic 3.  We will start paperwork for Tysabri and let you know when approved  Return to clinic in 2 months

## 2016-03-27 NOTE — Addendum Note (Signed)
Addended by: Chester Holstein on: 03/27/2016 01:23 PM   Modules accepted: Orders

## 2016-03-28 LAB — COMPREHENSIVE METABOLIC PANEL
ALT: 12 IU/L (ref 0–32)
AST: 14 IU/L (ref 0–40)
Albumin/Globulin Ratio: 1.5 (ref 1.2–2.2)
Albumin: 4.4 g/dL (ref 3.5–5.5)
Alkaline Phosphatase: 47 IU/L (ref 39–117)
BUN/Creatinine Ratio: 10 (ref 9–23)
BUN: 7 mg/dL (ref 6–20)
Bilirubin Total: <0.2 mg/dL (ref 0.0–1.2)
CHLORIDE: 100 mmol/L (ref 96–106)
CO2: 25 mmol/L (ref 18–29)
Calcium: 9.5 mg/dL (ref 8.7–10.2)
Creatinine, Ser: 0.67 mg/dL (ref 0.57–1.00)
GFR calc non Af Amer: 114 mL/min/{1.73_m2} (ref >59)
GFR, EST AFRICAN AMERICAN: 132 mL/min/{1.73_m2} (ref >59)
Globulin, Total: 2.9 g/dL (ref 1.5–4.5)
Glucose: 76 mg/dL (ref 65–99)
Potassium: 4.5 mmol/L (ref 3.5–5.2)
Sodium: 141 mmol/L (ref 134–144)
TOTAL PROTEIN: 7.3 g/dL (ref 6.0–8.5)

## 2016-03-28 LAB — CBC
Hematocrit: 36.5 % (ref 34.0–46.6)
Hemoglobin: 11.8 g/dL (ref 11.1–15.9)
MCH: 26.9 pg (ref 26.6–33.0)
MCHC: 32.3 g/dL (ref 31.5–35.7)
MCV: 83 fL (ref 79–97)
Platelets: 375 10*3/uL (ref 150–379)
RBC: 4.38 x10E6/uL (ref 3.77–5.28)
RDW: 14.4 % (ref 12.3–15.4)
WBC: 5.3 10*3/uL (ref 3.4–10.8)

## 2016-03-31 ENCOUNTER — Other Ambulatory Visit: Payer: Self-pay | Admitting: *Deleted

## 2016-03-31 ENCOUNTER — Telehealth: Payer: Self-pay | Admitting: Neurology

## 2016-03-31 MED ORDER — BACLOFEN 10 MG PO TABS: 10.0000 mg | ORAL_TABLET | Freq: Two times a day (BID) | ORAL | 3 refills | Status: DC

## 2016-03-31 NOTE — Telephone Encounter (Signed)
Rx sent to [facility]

## 2016-03-31 NOTE — Telephone Encounter (Signed)
PT called and needs a refill of Baclofen called in to the mail order/[facility]

## 2016-04-01 LAB — RFLX STRATIFY JCV (TM) AB INHIBITION: JCV Antibody by Inhibition: NEGATIVE

## 2016-04-01 LAB — STRATIFY JCV AB (W/ INDEX) W/ RFLX
INDEX VALUE: 0.21 — AB
JCV Antibody: UNDETERMINED — AB

## 2016-04-02 ENCOUNTER — Telehealth: Payer: Self-pay | Admitting: *Deleted

## 2016-04-02 NOTE — Telephone Encounter (Signed)
Patient given results

## 2016-04-02 NOTE — Telephone Encounter (Signed)
-----   Message from Alda Berthold, DO sent at 04/02/2016  7:52 AM EDT ----- Please inform labs look great and we will start prior auth on Tysabri.  Thanks.

## 2016-04-16 ENCOUNTER — Other Ambulatory Visit: Payer: Self-pay | Admitting: *Deleted

## 2016-04-16 DIAGNOSIS — G35 Multiple sclerosis: Secondary | ICD-10-CM

## 2016-04-23 ENCOUNTER — Other Ambulatory Visit: Payer: Self-pay | Admitting: *Deleted

## 2016-04-23 ENCOUNTER — Telehealth: Payer: Self-pay | Admitting: Neurology

## 2016-04-23 NOTE — Telephone Encounter (Signed)
Patient has not been set up for an appointment for infusion.  Order is in so I gave her the short stay number to call and schedule.

## 2016-04-23 NOTE — Telephone Encounter (Signed)
Caller: PT  Urgent? No  Reason for the call: PT called regarding appointment at [facility] for treatment

## 2016-04-28 ENCOUNTER — Other Ambulatory Visit: Payer: Self-pay | Admitting: *Deleted

## 2016-04-29 ENCOUNTER — Ambulatory Visit (HOSPITAL_COMMUNITY)
Admission: RE | Admit: 2016-04-29 | Discharge: 2016-04-29 | Disposition: A | Discharge status: Home / Self Care | Payer: 59 | Source: Ambulatory Visit | Attending: Neurology | Admitting: Neurology

## 2016-04-29 ENCOUNTER — Encounter (INDEPENDENT_AMBULATORY_CARE_PROVIDER_SITE_OTHER): Payer: Self-pay

## 2016-04-29 ENCOUNTER — Other Ambulatory Visit: Payer: Self-pay | Admitting: *Deleted

## 2016-04-29 ENCOUNTER — Encounter (HOSPITAL_COMMUNITY): Payer: Self-pay

## 2016-04-29 DIAGNOSIS — G35 Multiple sclerosis: Secondary | ICD-10-CM | POA: Insufficient documentation

## 2016-04-29 MED ORDER — LORATADINE 10 MG PO TABS
10.0000 mg | ORAL_TABLET | ORAL | Status: DC
  Administered 2016-04-29: 10 mg via ORAL
  Filled 2016-04-29: qty 1

## 2016-04-29 MED ORDER — ACETAMINOPHEN 500 MG PO TABS
1000.0000 mg | ORAL_TABLET | ORAL | Status: DC
  Administered 2016-04-29: 1000 mg via ORAL
  Filled 2016-04-29: qty 2

## 2016-04-29 MED ORDER — NATALIZUMAB 300 MG/15ML IV CONC
300.0000 mg | INTRAVENOUS | Status: DC
  Administered 2016-04-29: 300 mg via INTRAVENOUS
  Filled 2016-04-29: qty 15

## 2016-04-29 MED ORDER — SODIUM CHLORIDE 0.9 % IV SOLN
INTRAVENOUS | Status: DC
  Administered 2016-04-29: 09:00:00 via INTRAVENOUS

## 2016-04-29 NOTE — Progress Notes (Signed)
Pt's first infusion of Tysabri, pt. Tolerated without any problems, pt. Stayed 1 hour post-infusion, pt. To follow-up with doctor with any problems.

## 2016-04-29 NOTE — Discharge Instructions (Signed)

## 2016-05-14 ENCOUNTER — Ambulatory Visit (INDEPENDENT_AMBULATORY_CARE_PROVIDER_SITE_OTHER): Payer: 59 | Admitting: Neurology

## 2016-05-14 ENCOUNTER — Encounter: Payer: Self-pay | Admitting: Neurology

## 2016-05-14 VITALS — BP 130/90 | HR 103 | Ht 63.0 in | Wt 195.4 lb

## 2016-05-14 DIAGNOSIS — M2141 Flat foot [pes planus] (acquired), right foot: Secondary | ICD-10-CM

## 2016-05-14 DIAGNOSIS — S8981XS Other specified injuries of right lower leg, sequela: Secondary | ICD-10-CM

## 2016-05-14 DIAGNOSIS — G35 Multiple sclerosis: Secondary | ICD-10-CM

## 2016-05-14 DIAGNOSIS — R29898 Other symptoms and signs involving the musculoskeletal system: Secondary | ICD-10-CM

## 2016-05-14 NOTE — Progress Notes (Signed)
Follow-up Visit   Date: 05/14/16    Peggy Banks MRN: [id number] DOB: 1980-02-04   Interim History: Peggy Banks is a 36 y.o. right-handed African American female with hypertension returning to the clinic for follow-up of diagnosed multiple sclerosis.  The patient was accompanied to the clinic by self.    History of present illness: For the past 10 years, she has had a limp with her right leg and feels that is more effortful for her to move and often slaps the floor when she is walking.  Her leg also feels stiff and heavy.  She does not have muscle cramps.  There is numbness over the sole of the foot and involves the posterior lower calf.  Symptoms are worse with prolonged sitting.  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  Stretching sometimes helps.   Over the past 6 months, she became more dependent on using grocery carts because it helps maintain balance when she walks.  She is able to walk unassisted, but feels more comfortable leaning on objects.  She has also noticed that right toes on the right foot do not move and her foot tends to want to curl inwards.    She recalls having right radicular pain over her thigh during her pregnancy with her son, but her current symptoms are nothing like this. She endorses low back pain.  MRI lumbar spine did not show any nerve impingement.  She denies any numbness/tingling or weakness of the right face and arm.   No history of vision loss.    Upon further questioning, she was told ~2005 that there was abnormal change on her MRI for the same complaints and multiple sclerosis was mentioned, but she was never given the diagnosis.  She did not have CSF testing.  She did see neurology for this but after she became pregnant, many of her symptoms improve, and she was lost to follow-up.   UPDATE 02/27/2016:  Patient had MRI brain and cervical spine which showed diffuse white matter changes  affecting the brain as well as cervical cord, consistent with multiple sclerosis.  There are 4 small foci of enhancement. She was treated with 3 days of Solu-Medrol, but did not notice any marked change. She does mention having changes in her taste for a week following her infusion.  Upon further questioning about her history of neurological symptoms, she feels that her symptoms have always come and gone such as tingling of the legs and right hand.  She spends a lot of time at the computer for and is concerned her right hand tingling may be stemming from carpal tunnel syndrome. Since starting baclofen, her muscle stiffness and cramps have reduced, especially in the right leg.  Unfortunately, her gait remains unchanged and is still unsteady, with dragging of the right foot.  She also complains of tight band sensation around her rib cage and into her abdomen. She denies any shooting pain with neck flexion.  UPDATE 03/27/2016:  She is here to discuss DMTs and is interested in starting Tysabri.  Much of today's visit was spent discussing the medication, side effects, and expectation.  No new complaints today.  She feels that since getting steroids, some days are better with respect to her right foot tightness, but the foot continues to drag when walking.     UPDATE 05/14/2016:  She is here for 2 month follow-up appointment.  She had her  first infusion with Tysabri which she tolerated very well.  She has noticed less "brain fog" since undergoing her infusion. She has not started using her right AFO due to difficulty finding a shoe that fits.  She feels that her right toes are able to extend more than previously and tightness is less.  No new complaints.   Medications:   Current Outpatient Prescriptions on File Prior to Visit  Medication Sig Dispense Refill  . AMBULATORY NON FORMULARY MEDICATION 1 Units by Other route daily. Right AFO 1 Units 0  . baclofen (LIORESAL) 10 MG tablet Take 1 tablet (10 mg total) by  mouth 2 (two) times daily. 180 tablet 3  . Cholecalciferol (VITAMIN D PO) Take 5,000 Units by mouth.    . Ferrous Gluconate-C-Folic Acid (IRON-C PO) Take by mouth.    Marland Kitchen lisinopril-hydrochlorothiazide (PRINZIDE,ZESTORETIC) 10-12.5 MG tablet Take 1 tablet by mouth daily.    . Multiple Vitamin (MULTIVITAMIN) tablet Take 1 tablet by mouth daily.     No current facility-administered medications on file prior to visit.     Allergies:  Allergies  Allergen Reactions  . Cefdinir Swelling  . Penicillins     Review of Systems:  CONSTITUTIONAL: No fevers, chills, night sweats, or weight loss.  EYES: No visual changes or eye pain ENT: No hearing changes.  No history of nose bleeds.   RESPIRATORY: No cough, wheezing and shortness of breath.   CARDIOVASCULAR: Negative for chest pain, and palpitations.   GI: Negative for abdominal discomfort, blood in stools or black stools.  No recent change in bowel habits.   GU:  No history of incontinence.   MUSCLOSKELETAL: No history of joint pain or swelling.  No myalgias.   SKIN: Negative for lesions, rash, and itching.   ENDOCRINE: Negative for cold or heat intolerance, polydipsia or goiter.   PSYCH:  No depression or anxiety symptoms.   NEURO: As Above.   Vital Signs:  BP 130/90   Pulse (!) 103   Ht 5\' 3"  (1.6 m)   Wt 195 lb 6 oz (88.6 kg)   SpO2 98%   BMI 34.61 kg/m   Neurological Exam: MENTAL STATUS including orientation to time, place, person, recent and remote memory, attention span and concentration, language, and fund of knowledge is normal.  Speech is not dysarthric.  CRANIAL NERVES:  Face is symmetric.  MOTOR:  Motor strength is 5/5 in all extremities, except right foot dorsiflexion 4+/5, eversion 3/5, inversion 4/5, toe extension and flexion 2+/5.   I am unable to appreciate much spasticity in the foot.  Tone is normal in the right upper extremity (improved); although spasticity in the both legs is improved (0+).  MSRs:  Right                                                                  Left brachioradialis 3+  brachioradialis 2+  biceps 3+  biceps 2+  triceps 3+  triceps 2+  patellar 3+  patellar 3+  ankle jerk 3+  ankle jerk 2+  Hoffman yes  Hoffman no  plantar response down  plantar response down  No clonus (improved)  SENSORY: Vibration is absent at the toes bilaterally  COORDINATION/GAIT:   Gait is improved with less spasticity, she continues to  have right foot dragging and inversion, with hyperextension at the knee   Data: MRI brain personally reviewed dated 02/23/2006 which shows extensive scatter T2 hyperintensities in the subcortical and periventricular regions, with a larger confluent area on the left frontal horn and right posterior horn of the lateral ventricle.  There is also involvement of the corpus callosum. There is small foci of enhancement involving the subcortical parito-occiptal region.  MRI brain and cervical spine with and without contrast 02/07/2016:  Findings consistent with multiple sclerosis. Disease is extensive throughout the brain and cervical cord and there is white matter atrophy. Four foci of enhancement/active demyelination are noted.  MRI thoracic spine wwo contrast 03/10/2016:  1. Widespread severe thoracic spinal cord signal abnormality compatible with chronic demyelinating disease. No areas of acute thoracic cord demyelination are identified. 2. Isolated small right paracentral thoracic disc herniation at T10-T11. Borderline to mild associated spinal stenosis at that level.   IMPRESSION/PLAN: Mrs. Lybrand is a 36 year old female returning for follow-up of multiple sclerosis. Symptom onset was in 2005 with right leg spasticity and gait difficulties, however she was lost to follow-up and did not undergo neurological evaluation at that time. She started seeing me in January 2018 for these complaints and imaging confirmed multiple sclerosis with disease burden involving juxtacortical,  periventricular, brainstem, and cerebellum as well as short segment demyelinating changes affecting nearly every level of the cervical and thoracic spine.    She was started on Tysabri in April 2018 and has been tolerating this well.  Her right foot spasticity has improved with baclofen, but she continues to have right knee hyperextension with walking and I would like to seek the opinion of PM&R for management as to prevent injury to her knee.   PLAN: 1.  Continue monthly Tysabri infusions 2.  Continue baclofen 10mg  twice daily which has improved right leg spasticity 3.  Referral to PM&R for recommendations of right knee hypertension 4.  Continue to use right AFO for foot weakness 5.  Check JCV antibody, CBC, and CMP in October   6.  Continue vitamin D 5000 units  Return to clinic in 4 months.    The duration of this appointment visit was 20 minutes of face-to-face time with the patient.  Greater than 50% of this time was spent in counseling, explanation of diagnosis, planning of further management, and coordination of care.   Thank you for allowing me to participate in patient's care.  If I can answer any additional questions, I would be pleased to do so.    Sincerely,    Donika K. Posey Pronto, DO

## 2016-05-14 NOTE — Patient Instructions (Addendum)
Continue your medications as you are taking them We will refer you to Physical Medicine and Rehab for their management options of knee hyperextension  Return to clinic 4 months

## 2016-05-28 ENCOUNTER — Encounter (HOSPITAL_COMMUNITY)
Admission: RE | Admit: 2016-05-28 | Discharge: 2016-05-28 | Disposition: A | Discharge status: Home / Self Care | Payer: 59 | Source: Ambulatory Visit | Attending: Neurology | Admitting: Neurology

## 2016-05-28 ENCOUNTER — Encounter (HOSPITAL_COMMUNITY): Payer: Self-pay

## 2016-05-28 DIAGNOSIS — G35 Multiple sclerosis: Secondary | ICD-10-CM

## 2016-05-28 DIAGNOSIS — I10 Essential (primary) hypertension: Secondary | ICD-10-CM | POA: Diagnosis not present

## 2016-05-28 MED ORDER — SODIUM CHLORIDE 0.9 % IV SOLN
INTRAVENOUS | Status: DC
  Administered 2016-05-28: 08:00:00 via INTRAVENOUS

## 2016-05-28 MED ORDER — NATALIZUMAB 300 MG/15ML IV CONC
300.0000 mg | INTRAVENOUS | Status: DC
  Administered 2016-05-28: 300 mg via INTRAVENOUS
  Filled 2016-05-28: qty 15

## 2016-05-28 MED ORDER — LORATADINE 10 MG PO TABS
10.0000 mg | ORAL_TABLET | ORAL | Status: DC
  Administered 2016-05-28: 10 mg via ORAL
  Filled 2016-05-28: qty 1

## 2016-05-28 MED ORDER — ACETAMINOPHEN 500 MG PO TABS
1000.0000 mg | ORAL_TABLET | ORAL | Status: DC
  Administered 2016-05-28: 1000 mg via ORAL
  Filled 2016-05-28: qty 2

## 2016-05-28 NOTE — Progress Notes (Signed)
unsuccessful Tysabri Infusion; pt stayed the 1 hour TOUCH recommendation post infusion observation; Pt aware to contact MD of any difficulties.

## 2016-05-28 NOTE — Discharge Instructions (Signed)
ATTENTION:  If you are going to be 15 or more minutes late for your appointment, please call [phone] to make other arrangements for your treatment.  If you arrive early for your schedule appointment, you may have to wait until your scheduled time.   Tysabri Natalizumab injection What is this medicine? NATALIZUMAB (na ta LIZ you mab) is used to treat relapsing multiple sclerosis. This drug is not a cure. It is also used to treat Crohn's disease. This medicine may be used for other purposes; ask your health care provider or pharmacist if you have questions. COMMON BRAND NAME(S): Tysabri What should I tell my health care provider before I take this medicine? They need to know if you have any of these conditions: -immune system problems -progressive multifocal leukoencephalopathy (PML) -an unusual or allergic reaction to natalizumab, other medicines, foods, dyes, or preservatives -pregnant or trying to get pregnant -breast-feeding How should I use this medicine? This medicine is for infusion into a vein. It is given by a health care professional in a hospital or clinic setting. A special MedGuide will be given to you by the pharmacist with each prescription and refill. Be sure to read this information carefully each time. Talk to your pediatrician regarding the use of this medicine in children. This medicine is not approved for use in children. Overdosage: If you think you have taken too much of this medicine contact a poison control center or emergency room at once. NOTE: This medicine is only for you. Do not share this medicine with others. What if I miss a dose? It is important not to miss your dose. Call your doctor or health care professional if you are unable to keep an appointment. What may interact with this medicine? -azathioprine -cyclosporine -interferon -6-mercaptopurine -methotrexate -steroid medicines like prednisone or cortisone -TNF-alpha inhibitors like  adalimumab, etanercept, and infliximab -vaccines This list may not describe all possible interactions. Give your health care provider a list of all the medicines, herbs, non-prescription drugs, or dietary supplements you use. Also tell them if you smoke, drink alcohol, or use illegal drugs. Some items may interact with your medicine. What should I watch for while using this medicine? Your condition will be monitored carefully while you are receiving this medicine. Visit your doctor for regular check ups. Tell your doctor or healthcare professional if your symptoms do not start to get better or if they get worse. Stay away from people who are sick. Call your doctor or health care professional for advice if you get a fever, chills or sore throat, or other symptoms of a cold or flu. Do not treat yourself. In some patients, this medicine may cause a serious brain infection that may cause death. If you have any problems seeing, thinking, speaking, walking, or standing, tell your doctor right away. If you cannot reach your doctor, get urgent medical care. What side effects may I notice from receiving this medicine? Side effects that you should report to your doctor or health care professional as soon as possible: -allergic reactions like skin rash, itching or hives, swelling of the face, lips, or tongue -breathing problems -changes in vision -chest pain -dark urine -depression, feelings of sadness -dizziness -general ill feeling or flu-like symptoms -irregular, missed, or painful menstrual periods -light-colored stools -loss of appetite, nausea -muscle weakness -problems with balance, talking, or walking -right upper belly pain -unusually weak or tired -yellowing of the eyes or skin Side effects that usually do not require medical attention (report to your  doctor or health care professional if they continue or are bothersome): -aches, pains -headache -stomach upset -tiredness This list may  not describe all possible side effects. Call your doctor for medical advice about side effects. You may report side effects to FDA at 1-800-FDA-1088. Where should I keep my medicine? This drug is given in a hospital or clinic and will not be stored at home. NOTE: This sheet is a summary. It may not cover all possible information. If you have questions about this medicine, talk to your doctor, pharmacist, or health care provider.  2018 Elsevier/Gold Standard (2008-02-11 13:33:21)

## 2016-06-25 ENCOUNTER — Encounter (HOSPITAL_COMMUNITY)
Admission: RE | Admit: 2016-06-25 | Discharge: 2016-06-25 | Disposition: A | Discharge status: Home / Self Care | Payer: 59 | Source: Ambulatory Visit | Attending: Neurology | Admitting: Neurology

## 2016-06-25 ENCOUNTER — Encounter (INDEPENDENT_AMBULATORY_CARE_PROVIDER_SITE_OTHER): Payer: Self-pay

## 2016-06-25 ENCOUNTER — Encounter (HOSPITAL_COMMUNITY): Payer: Self-pay

## 2016-06-25 DIAGNOSIS — I10 Essential (primary) hypertension: Secondary | ICD-10-CM | POA: Diagnosis not present

## 2016-06-25 DIAGNOSIS — G35 Multiple sclerosis: Secondary | ICD-10-CM | POA: Diagnosis not present

## 2016-06-25 MED ORDER — LORATADINE 10 MG PO TABS
10.0000 mg | ORAL_TABLET | ORAL | Status: DC
  Administered 2016-06-25: 10 mg via ORAL
  Filled 2016-06-25: qty 1

## 2016-06-25 MED ORDER — ACETAMINOPHEN 500 MG PO TABS
1000.0000 mg | ORAL_TABLET | ORAL | Status: DC
  Administered 2016-06-25: 1000 mg via ORAL
  Filled 2016-06-25: qty 2

## 2016-06-25 MED ORDER — NATALIZUMAB 300 MG/15ML IV CONC
300.0000 mg | INTRAVENOUS | Status: DC
  Administered 2016-06-25: 300 mg via INTRAVENOUS
  Filled 2016-06-25: qty 15

## 2016-06-25 MED ORDER — SODIUM CHLORIDE 0.9 % IV SOLN
INTRAVENOUS | Status: DC
  Administered 2016-06-25: 08:00:00 via INTRAVENOUS

## 2016-06-25 NOTE — Discharge Instructions (Signed)

## 2016-07-23 ENCOUNTER — Encounter (HOSPITAL_COMMUNITY): Payer: Self-pay

## 2016-07-23 ENCOUNTER — Encounter (HOSPITAL_COMMUNITY)
Admission: RE | Admit: 2016-07-23 | Discharge: 2016-07-23 | Disposition: A | Discharge status: Home / Self Care | Payer: 59 | Source: Ambulatory Visit | Attending: Neurology | Admitting: Neurology

## 2016-07-23 DIAGNOSIS — G35 Multiple sclerosis: Secondary | ICD-10-CM | POA: Diagnosis present

## 2016-07-23 DIAGNOSIS — I10 Essential (primary) hypertension: Secondary | ICD-10-CM | POA: Insufficient documentation

## 2016-07-23 MED ORDER — LORATADINE 10 MG PO TABS
10.0000 mg | ORAL_TABLET | ORAL | Status: DC
  Administered 2016-07-23: 10 mg via ORAL
  Filled 2016-07-23: qty 1

## 2016-07-23 MED ORDER — SODIUM CHLORIDE 0.9 % IV SOLN
300.0000 mg | INTRAVENOUS | Status: DC
  Administered 2016-07-23: 300 mg via INTRAVENOUS
  Filled 2016-07-23: qty 15

## 2016-07-23 MED ORDER — ACETAMINOPHEN 500 MG PO TABS
1000.0000 mg | ORAL_TABLET | ORAL | Status: DC
  Administered 2016-07-23: 1000 mg via ORAL
  Filled 2016-07-23: qty 2

## 2016-07-23 MED ORDER — SODIUM CHLORIDE 0.9 % IV SOLN
INTRAVENOUS | Status: DC
  Administered 2016-07-23: 10:00:00 via INTRAVENOUS

## 2016-08-17 ENCOUNTER — Telehealth: Payer: Self-pay | Admitting: Neurology

## 2016-08-17 ENCOUNTER — Other Ambulatory Visit: Payer: Self-pay | Admitting: *Deleted

## 2016-08-17 DIAGNOSIS — G35 Multiple sclerosis: Secondary | ICD-10-CM

## 2016-08-17 NOTE — Telephone Encounter (Signed)
Informed patient that I would look at her forms and correct them.  Section C.

## 2016-08-17 NOTE — Telephone Encounter (Signed)
PT called and said she got a notice saying her FMLA paperwork was incomplete and would like a call back regarding this

## 2016-08-20 ENCOUNTER — Encounter (HOSPITAL_COMMUNITY): Admission: RE | Admit: 2016-08-20 | Payer: 59 | Source: Ambulatory Visit

## 2016-08-27 ENCOUNTER — Other Ambulatory Visit: Payer: Self-pay

## 2016-08-27 ENCOUNTER — Telehealth: Payer: Self-pay | Admitting: Neurology

## 2016-08-27 NOTE — Telephone Encounter (Signed)
Anda Kraft with [employer] called in regards to some FMLA paperwork and would like a call back CB# [phone] [id number]

## 2016-08-28 NOTE — Telephone Encounter (Signed)
Left message for Peggy Banks to call me back.

## 2016-08-31 ENCOUNTER — Telehealth: Payer: Self-pay | Admitting: Neurology

## 2016-08-31 NOTE — Telephone Encounter (Signed)
Called Anda Kraft back and gave her the update on patient's paperwork.

## 2016-08-31 NOTE — Telephone Encounter (Signed)
Peggy Banks called regarding this patient's FMLA paper work. She had some questions regarding Part C. Please Call. Thanks

## 2016-09-02 ENCOUNTER — Encounter (HOSPITAL_COMMUNITY)
Admission: RE | Admit: 2016-09-02 | Discharge: 2016-09-02 | Disposition: A | Discharge status: Home / Self Care | Payer: 59 | Source: Ambulatory Visit | Attending: Neurology | Admitting: Neurology

## 2016-09-02 ENCOUNTER — Encounter (HOSPITAL_COMMUNITY): Payer: Self-pay

## 2016-09-02 DIAGNOSIS — G35 Multiple sclerosis: Secondary | ICD-10-CM | POA: Diagnosis present

## 2016-09-02 DIAGNOSIS — I10 Essential (primary) hypertension: Secondary | ICD-10-CM | POA: Insufficient documentation

## 2016-09-02 MED ORDER — LORATADINE 10 MG PO TABS
10.0000 mg | ORAL_TABLET | Freq: Once | ORAL | Status: AC
  Administered 2016-09-02: 10 mg via ORAL
  Filled 2016-09-02: qty 1

## 2016-09-02 MED ORDER — SODIUM CHLORIDE 0.9 % IV SOLN
300.0000 mg | INTRAVENOUS | Status: DC
  Administered 2016-09-02: 300 mg via INTRAVENOUS
  Filled 2016-09-02: qty 15

## 2016-09-02 MED ORDER — SODIUM CHLORIDE 0.9 % IV SOLN
Freq: Once | INTRAVENOUS | Status: AC
  Administered 2016-09-02: 08:00:00 via INTRAVENOUS

## 2016-09-02 MED ORDER — ACETAMINOPHEN 500 MG PO TABS: 1000.0000 mg | ORAL_TABLET | ORAL | Status: DC

## 2016-09-02 MED ORDER — ACETAMINOPHEN 500 MG PO TABS
1000.0000 mg | ORAL_TABLET | Freq: Four times a day (QID) | ORAL | Status: DC | PRN
  Administered 2016-09-02: 1000 mg via ORAL
  Filled 2016-09-02: qty 2

## 2016-09-02 MED ORDER — LORATADINE 10 MG PO TABS: 10.0000 mg | ORAL_TABLET | ORAL | Status: DC

## 2016-09-02 MED ORDER — SODIUM CHLORIDE 0.9 % IV SOLN: INTRAVENOUS | Status: DC

## 2016-09-10 ENCOUNTER — Telehealth: Payer: Self-pay | Admitting: Neurology

## 2016-09-10 NOTE — Telephone Encounter (Signed)
Peggy Banks called regarding her FMLA paperwork. She received another letter from the [employer]. She said Part C Question # 3 was not answered. Please Call. Thanks

## 2016-09-17 ENCOUNTER — Encounter (HOSPITAL_COMMUNITY): Payer: 59

## 2016-09-30 ENCOUNTER — Encounter (HOSPITAL_COMMUNITY)
Admission: RE | Admit: 2016-09-30 | Discharge: 2016-09-30 | Disposition: A | Discharge status: Home / Self Care | Payer: 59 | Source: Ambulatory Visit | Attending: Neurology | Admitting: Neurology

## 2016-09-30 ENCOUNTER — Encounter (HOSPITAL_COMMUNITY): Payer: Self-pay

## 2016-09-30 DIAGNOSIS — G35 Multiple sclerosis: Secondary | ICD-10-CM

## 2016-09-30 DIAGNOSIS — I10 Essential (primary) hypertension: Secondary | ICD-10-CM | POA: Diagnosis not present

## 2016-09-30 MED ORDER — SODIUM CHLORIDE 0.9 % IV SOLN
INTRAVENOUS | Status: AC
  Administered 2016-09-30: 09:00:00 via INTRAVENOUS

## 2016-09-30 MED ORDER — LORATADINE 10 MG PO TABS
10.0000 mg | ORAL_TABLET | ORAL | Status: DC
  Administered 2016-09-30: 10 mg via ORAL
  Filled 2016-09-30: qty 1

## 2016-09-30 MED ORDER — SODIUM CHLORIDE 0.9 % IV SOLN
300.0000 mg | INTRAVENOUS | Status: DC
  Administered 2016-09-30: 300 mg via INTRAVENOUS
  Filled 2016-09-30: qty 15

## 2016-09-30 MED ORDER — ACETAMINOPHEN 500 MG PO TABS
1000.0000 mg | ORAL_TABLET | Freq: Once | ORAL | Status: AC
  Administered 2016-09-30: 1000 mg via ORAL
  Filled 2016-09-30: qty 2

## 2016-09-30 MED ORDER — ACETAMINOPHEN 160 MG/5ML PO SOLN: 1000.0000 mg | ORAL | Status: DC

## 2016-09-30 NOTE — Progress Notes (Signed)
Pt tolerated Tysabri infusion without event.  Stayed 15 minutes of recommended 1 hour post observation time. Verbalized understanding of action plan if symptoms occur.

## 2016-10-02 ENCOUNTER — Ambulatory Visit (INDEPENDENT_AMBULATORY_CARE_PROVIDER_SITE_OTHER): Payer: 59 | Admitting: Neurology

## 2016-10-02 ENCOUNTER — Encounter: Payer: Self-pay | Admitting: Neurology

## 2016-10-02 ENCOUNTER — Other Ambulatory Visit: Payer: 59

## 2016-10-02 VITALS — BP 140/80 | HR 104 | Ht 63.0 in | Wt 203.2 lb

## 2016-10-02 DIAGNOSIS — R29898 Other symptoms and signs involving the musculoskeletal system: Secondary | ICD-10-CM | POA: Diagnosis not present

## 2016-10-02 DIAGNOSIS — M62838 Other muscle spasm: Secondary | ICD-10-CM

## 2016-10-02 DIAGNOSIS — S8981XA Other specified injuries of right lower leg, initial encounter: Secondary | ICD-10-CM

## 2016-10-02 DIAGNOSIS — G35 Multiple sclerosis: Secondary | ICD-10-CM | POA: Diagnosis not present

## 2016-10-02 DIAGNOSIS — S8981XS Other specified injuries of right lower leg, sequela: Secondary | ICD-10-CM

## 2016-10-02 NOTE — Patient Instructions (Addendum)
1.  Referral to physical therapy to evaluate for left knee brace for hyperextension 2.  Continue monthly Tysabri 3.  Continue baclofen 10mg  twice daily.  If it makes your too sleepy, reduce your morning dose to half tablet 4.  Check labs  Return to clinic in 4 months

## 2016-10-02 NOTE — Progress Notes (Signed)
Follow-up Visit   Date: 10/02/16    Peggy Banks MRN: [id number] DOB: October 07, 1980   Interim History: Peggy Banks is a 36 y.o. right-handed African American female with hypertension returning to the clinic for follow-up of diagnosed multiple sclerosis.  The patient was accompanied to the clinic by self.    History of present illness: For the past 10 years, she has had a limp with her right leg and feels that is more effortful for her to move and often slaps the floor when she is walking.  Her leg also feels stiff and heavy.   There is numbness over the sole of the foot and involves the posterior lower calf.   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  Stretching sometimes helps.   During the latter half of 2017, she became more dependent on using grocery carts because it helps maintain balance when she walks.  She is able to walk unassisted, but feels more comfortable leaning on objects.  She has also noticed that right toes on the right foot do not move and her foot tends to want to curl inwards.    She recalls having right radicular pain over her thigh during her pregnancy with her son, but her current symptoms are nothing like this. She endorses low back pain.  MRI lumbar spine did not show any nerve impingement.  She denies any numbness/tingling or weakness of the right face and arm.   No history of vision loss.    Upon further questioning, she was told ~2005 that there was abnormal change on her MRI for the same complaints and multiple sclerosis was mentioned, but she was never given the diagnosis.  She did not have CSF testing.  She did see neurology for this but after she became pregnant, many of her symptoms improve, and she was lost to follow-up.   In early 2018, patient underwent imaging which showed diffuse white matter changes affecting the brain as well as cervical cord, consistent with multiple sclerosis.  There are  4 small foci of enhancement. She was treated with 3 days of Solu-Medrol, but did not notice any marked change.  Since starting baclofen, her muscle stiffness and cramps have reduced, especially in the right leg.  Unfortunately, her gait remains unchanged and is still unsteady, with dragging of the right foot. She was started Tysabri in Aptil 2018 and offered AFO for her right foot weakness.    UPDATE 10/02/2016:  She is here for 4 month follow-up visit and is doing well.  She continues to get Tysabri infusions monthly and is tolerating them well and feel that it is helping because her right foot is not as stiff as previously.  She is able to extend and flex her toes better than before.  Unfortunately, her right knee continues to hyperextend and we had referred her to PM&R for evaluation, but she never received a call to schedule the appointment.  Medications:   Current Outpatient Prescriptions on File Prior to Visit  Medication Sig Dispense Refill  . AMBULATORY NON FORMULARY MEDICATION 1 Units by Other route daily. Right AFO 1 Units 0  . baclofen (LIORESAL) 10 MG tablet Take 1 tablet (10 mg total) by mouth 2 (two) times daily. 180 tablet 3  . Cholecalciferol (VITAMIN D PO) Take 5,000 Units by mouth.    . Ferrous Gluconate-C-Folic Acid (IRON-C PO) Take by mouth.    Marland Kitchen lisinopril-hydrochlorothiazide (  PRINZIDE,ZESTORETIC) 10-12.5 MG tablet Take 1 tablet by mouth daily.    . Multiple Vitamin (MULTIVITAMIN) tablet Take 1 tablet by mouth daily.     No current facility-administered medications on file prior to visit.     Allergies:  Allergies  Allergen Reactions  . Cefdinir Swelling  . Penicillins     Review of Systems:  CONSTITUTIONAL: No fevers, chills, night sweats, or weight loss.  EYES: No visual changes or eye pain ENT: No hearing changes.  No history of nose bleeds.   RESPIRATORY: No cough, wheezing and shortness of breath.   CARDIOVASCULAR: Negative for chest pain, and palpitations.     GI: Negative for abdominal discomfort, blood in stools or black stools.  No recent change in bowel habits.   GU:  No history of incontinence.   MUSCLOSKELETAL: No history of joint pain or swelling.  No myalgias.   SKIN: Negative for lesions, rash, and itching.   ENDOCRINE: Negative for cold or heat intolerance, polydipsia or goiter.   PSYCH:  No depression or anxiety symptoms.   NEURO: As Above.   Vital Signs:  BP 140/80   Pulse (!) 104   Ht 5\' 3"  (1.6 m)   Wt 203 lb 4 oz (92.2 kg)   SpO2 97%   BMI 36.00 kg/m   Neurological Exam: MENTAL STATUS including orientation to time, place, person, recent and remote memory, attention span and concentration, language, and fund of knowledge is normal.  Speech is not dysarthric.  CRANIAL NERVES:  Pupils round and reactive to light.  Extraocular muscles intact.  Face is symmetric.  MOTOR:  Motor strength is 5/5 in all extremities, except right foot dorsiflexion 4+/5, eversion 4/5, inversion 4/5, toe extension and flexion 2+/5.  There is no spasticity in the legs.  MSRs:  Right                                                                 Left brachioradialis 3+  brachioradialis 2+  biceps 3+  biceps 2+  triceps 3+  triceps 2+  patellar 3+  patellar 3+  ankle jerk 3+  ankle jerk 2+  There is no ankle clonus  SENSORY: Vibration is absent at the toes bilaterally  COORDINATION/GAIT:  Gait continues to show R knee hyperextension, mild dragging of the right foot (improved)  Data: MRI brain personally reviewed dated 02/23/2006 which shows extensive scatter T2 hyperintensities in the subcortical and periventricular regions, with a larger confluent area on the left frontal horn and right posterior horn of the lateral ventricle.  There is also involvement of the corpus callosum. There is small foci of enhancement involving the subcortical parito-occiptal region.  MRI brain and cervical spine with and without contrast 02/07/2016:  Findings  consistent with multiple sclerosis. Disease is extensive throughout the brain and cervical cord and there is white matter atrophy. Four foci of enhancement/active demyelination are noted.  MRI thoracic spine wwo contrast 03/10/2016:  1. Widespread severe thoracic spinal cord signal abnormality compatible with chronic demyelinating disease. No areas of acute thoracic cord demyelination are identified. 2. Isolated small right paracentral thoracic disc herniation at T10-T11. Borderline to mild associated spinal stenosis at that level.   IMPRESSION/PLAN: Mrs. Rogacki is a 36 year old female returning for follow-up of multiple sclerosis. Symptoms  started in 2005 with right leg spasticity and gait difficulties, however she was lost to follow-up and did not undergo neurological evaluation at that time. She started seeing me in January 2018 for these complaints and imaging confirmed multiple sclerosis with high disease burden involving juxtacortical, periventricular, brainstem, and cerebellum as well as short segment demyelinating changes affecting nearly every level of the cervical and thoracic spine.    She was started on Tysabri in April 2018 and has been tolerating this well.  Right foot spasticity has significantly improved with baclofen, but she continues to have right knee hyperextension and I would like her evaluated for a brace as to prevent long term injury of the knee.    PLAN: 1.  Continue monthly Tysabri infusions 2.  Continue baclofen 10mg  twice daily - if this makes her too sleepy during the day, okay to reduce to 5mg  in the morning and continue 10mg  at bedtime 3.  PT referral for recommendations for left knee brace for knee hyperextension 4.  Recommend suing right AFO for foot weakness 5.  Check JCV antibody and CMP.  She had CBC from 9/10 is normal.  6.  Continue vitamin D 5000 units  Return to clinic in 4 months.   Greater than 50% of this 25 minute visit was spent in counseling,  explanation of diagnosis, planning of further management, and coordination of care.   Thank you for allowing me to participate in patient's care.  If I can answer any additional questions, I would be pleased to do so.    Sincerely,    Donika K. Posey Pronto, DO

## 2016-10-06 ENCOUNTER — Other Ambulatory Visit: Payer: Self-pay | Admitting: *Deleted

## 2016-10-06 DIAGNOSIS — G35 Multiple sclerosis: Secondary | ICD-10-CM

## 2016-10-06 NOTE — Progress Notes (Signed)
Patient notified and will come in for CMP.

## 2016-10-07 ENCOUNTER — Telehealth: Payer: Self-pay | Admitting: *Deleted

## 2016-10-07 ENCOUNTER — Other Ambulatory Visit (INDEPENDENT_AMBULATORY_CARE_PROVIDER_SITE_OTHER): Payer: 59

## 2016-10-07 DIAGNOSIS — G35 Multiple sclerosis: Secondary | ICD-10-CM

## 2016-10-07 LAB — COMPREHENSIVE METABOLIC PANEL
ALBUMIN: 4 g/dL (ref 3.5–5.2)
ALK PHOS: 37 U/L — AB (ref 39–117)
ALT: 16 U/L (ref 0–35)
AST: 15 U/L (ref 0–37)
BILIRUBIN TOTAL: 0.3 mg/dL (ref 0.2–1.2)
BUN: 10 mg/dL (ref 6–23)
CALCIUM: 9.2 mg/dL (ref 8.4–10.5)
CO2: 26 mEq/L (ref 19–32)
Chloride: 102 mEq/L (ref 96–112)
Creatinine, Ser: 0.7 mg/dL (ref 0.40–1.20)
GFR: 121.83 mL/min (ref >60.00)
Glucose, Bld: 139 mg/dL — ABNORMAL HIGH (ref 70–99)
Potassium: 3.9 mEq/L (ref 3.5–5.1)
Sodium: 137 mEq/L (ref 135–145)
TOTAL PROTEIN: 7.2 g/dL (ref 6.0–8.3)

## 2016-10-07 NOTE — Telephone Encounter (Signed)
-----   Message from Alda Berthold, DO sent at 10/07/2016 11:00 AM EDT ----- Liver enzymes and electrolytes are normal.  Any updates on the status of her JCV?

## 2016-10-09 LAB — STRATIFY JCV AB (W/ INDEX) W/ RFLX
Index Value: 0.2 — ABNORMAL HIGH
STRATIFY JCV (TM) AB W/REFLEX INHIBITION: UNDETERMINED — AB

## 2016-10-09 LAB — RFLX STRATIFY JCV (TM) AB INHIBITION: JCV Antibody by Inhibition: NEGATIVE

## 2016-10-15 ENCOUNTER — Encounter (HOSPITAL_COMMUNITY): Payer: 59

## 2016-10-28 ENCOUNTER — Encounter (HOSPITAL_COMMUNITY): Payer: Self-pay

## 2016-10-28 ENCOUNTER — Encounter (HOSPITAL_COMMUNITY)
Admission: RE | Admit: 2016-10-28 | Discharge: 2016-10-28 | Disposition: A | Discharge status: Home / Self Care | Payer: 59 | Source: Ambulatory Visit | Attending: Neurology | Admitting: Neurology

## 2016-10-28 DIAGNOSIS — G35 Multiple sclerosis: Secondary | ICD-10-CM | POA: Insufficient documentation

## 2016-10-28 DIAGNOSIS — I10 Essential (primary) hypertension: Secondary | ICD-10-CM | POA: Insufficient documentation

## 2016-10-28 MED ORDER — SODIUM CHLORIDE 0.9 % IV SOLN
INTRAVENOUS | Status: AC
  Administered 2016-10-28: 09:00:00 via INTRAVENOUS

## 2016-10-28 MED ORDER — ACETAMINOPHEN 500 MG PO TABS
1000.0000 mg | ORAL_TABLET | Freq: Once | ORAL | Status: AC
  Administered 2016-10-28: 1000 mg via ORAL
  Filled 2016-10-28: qty 2

## 2016-10-28 MED ORDER — LORATADINE 10 MG PO TABS
10.0000 mg | ORAL_TABLET | ORAL | Status: DC
  Administered 2016-10-28: 10 mg via ORAL
  Filled 2016-10-28: qty 1

## 2016-10-28 MED ORDER — NATALIZUMAB 300 MG/15ML IV CONC
300.0000 mg | INTRAVENOUS | Status: DC
  Administered 2016-10-28: 300 mg via INTRAVENOUS
  Filled 2016-10-28: qty 15

## 2016-11-10 ENCOUNTER — Ambulatory Visit: Payer: 59 | Admitting: Physical Therapy

## 2016-11-11 ENCOUNTER — Other Ambulatory Visit: Payer: Self-pay | Admitting: *Deleted

## 2016-11-11 DIAGNOSIS — G35 Multiple sclerosis: Secondary | ICD-10-CM

## 2016-11-12 ENCOUNTER — Encounter (HOSPITAL_COMMUNITY): Payer: 59

## 2016-11-25 ENCOUNTER — Encounter (HOSPITAL_COMMUNITY): Payer: Self-pay

## 2016-11-25 ENCOUNTER — Encounter (HOSPITAL_COMMUNITY)
Admission: RE | Admit: 2016-11-25 | Discharge: 2016-11-25 | Disposition: A | Discharge status: Home / Self Care | Payer: 59 | Source: Ambulatory Visit | Attending: Neurology | Admitting: Neurology

## 2016-11-25 DIAGNOSIS — G35 Multiple sclerosis: Secondary | ICD-10-CM | POA: Diagnosis not present

## 2016-11-25 DIAGNOSIS — I10 Essential (primary) hypertension: Secondary | ICD-10-CM | POA: Insufficient documentation

## 2016-11-25 MED ORDER — SODIUM CHLORIDE 0.9 % IV SOLN
INTRAVENOUS | Status: DC
Start: 2016-11-25 — End: 2016-11-26
  Administered 2016-11-25: 11:00:00 via INTRAVENOUS

## 2016-11-25 MED ORDER — SODIUM CHLORIDE 0.9 % IV SOLN
300.0000 mg | INTRAVENOUS | Status: DC
  Administered 2016-11-25: 300 mg via INTRAVENOUS
  Filled 2016-11-25: qty 15

## 2016-11-25 MED ORDER — LORATADINE 10 MG PO TABS
10.0000 mg | ORAL_TABLET | Freq: Once | ORAL | Status: AC
  Administered 2016-11-25: 10 mg via ORAL
  Filled 2016-11-25: qty 1

## 2016-11-25 MED ORDER — ACETAMINOPHEN 500 MG PO TABS
1000.0000 mg | ORAL_TABLET | Freq: Once | ORAL | Status: AC
  Administered 2016-11-25: 1000 mg via ORAL
  Filled 2016-11-25: qty 2

## 2016-11-25 NOTE — Progress Notes (Signed)
Pt tolerated tysabri without event and chose to not stay the recommended 1 hour post infusion observation time.  Verbalizes understanding to call MD should adverse symptoms arise.

## 2016-12-10 ENCOUNTER — Encounter (HOSPITAL_COMMUNITY): Payer: 59

## 2016-12-18 ENCOUNTER — Telehealth: Payer: Self-pay | Admitting: Neurology

## 2016-12-18 NOTE — Telephone Encounter (Signed)
Patient will come by on Monday regarding her paper work she had dropped off. She had a question. Thanks

## 2016-12-25 ENCOUNTER — Encounter (HOSPITAL_COMMUNITY)
Admission: RE | Admit: 2016-12-25 | Discharge: 2016-12-25 | Disposition: A | Discharge status: Home / Self Care | Payer: 59 | Source: Ambulatory Visit | Attending: Neurology | Admitting: Neurology

## 2016-12-25 ENCOUNTER — Encounter (HOSPITAL_COMMUNITY): Payer: Self-pay

## 2016-12-25 DIAGNOSIS — I10 Essential (primary) hypertension: Secondary | ICD-10-CM | POA: Diagnosis not present

## 2016-12-25 DIAGNOSIS — G35 Multiple sclerosis: Secondary | ICD-10-CM

## 2016-12-25 MED ORDER — ACETAMINOPHEN 500 MG PO TABS
1000.0000 mg | ORAL_TABLET | Freq: Once | ORAL | Status: AC
  Administered 2016-12-25: 1000 mg via ORAL
  Filled 2016-12-25: qty 2

## 2016-12-25 MED ORDER — NATALIZUMAB 300 MG/15ML IV CONC
300.0000 mg | INTRAVENOUS | Status: DC
  Administered 2016-12-25: 300 mg via INTRAVENOUS
  Filled 2016-12-25: qty 15

## 2016-12-25 MED ORDER — LORATADINE 10 MG PO TABS
10.0000 mg | ORAL_TABLET | Freq: Once | ORAL | Status: AC
  Administered 2016-12-25: 10 mg via ORAL
  Filled 2016-12-25: qty 1

## 2016-12-25 MED ORDER — SODIUM CHLORIDE 0.9 % IV SOLN
INTRAVENOUS | Status: DC
  Administered 2016-12-25: 09:00:00 via INTRAVENOUS

## 2016-12-25 NOTE — Discharge Instructions (Signed)
Natalizumab injection/Tysabri Infusion  °What is this medicine? °NATALIZUMAB (na ta LIZ you mab) is used to treat relapsing multiple sclerosis. This drug is not a cure. It is also used to treat Crohn's disease. °This medicine may be used for other purposes; ask your health care provider or pharmacist if you have questions. °COMMON BRAND NAME(S): Tysabri °What should I tell my health care provider before I take this medicine? °They need to know if you have any of these conditions: °-immune system problems °-progressive multifocal leukoencephalopathy (PML) °-an unusual or allergic reaction to natalizumab, other medicines, foods, dyes, or preservatives °-pregnant or trying to get pregnant °-breast-feeding °How should I use this medicine? °This medicine is for infusion into a vein. It is given by a health care professional in a hospital or clinic setting. °A special MedGuide will be given to you by the pharmacist with each prescription and refill. Be sure to read this information carefully each time. °Talk to your pediatrician regarding the use of this medicine in children. This medicine is not approved for use in children. °Overdosage: If you think you have taken too much of this medicine contact a poison control center or emergency room at once. °NOTE: This medicine is only for you. Do not share this medicine with others. °What if I miss a dose? °It is important not to miss your dose. Call your doctor or health care professional if you are unable to keep an appointment. °What may interact with this medicine? °-azathioprine °-cyclosporine °-interferon °-6-mercaptopurine °-methotrexate °-steroid medicines like prednisone or cortisone °-TNF-alpha inhibitors like adalimumab, etanercept, and infliximab °-vaccines °This list may not describe all possible interactions. Give your health care provider a list of all the medicines, herbs, non-prescription drugs, or dietary supplements you use. Also tell them if you smoke, drink  alcohol, or use illegal drugs. Some items may interact with your medicine. °What should I watch for while using this medicine? °Your condition will be monitored carefully while you are receiving this medicine. Visit your doctor for regular check ups. Tell your doctor or healthcare professional if your symptoms do not start to get better or if they get worse. °Stay away from people who are sick. Call your doctor or health care professional for advice if you get a fever, chills or sore throat, or other symptoms of a cold or flu. Do not treat yourself. °In some patients, this medicine may cause a serious brain infection that may cause death. If you have any problems seeing, thinking, speaking, walking, or standing, tell your doctor right away. If you cannot reach your doctor, get urgent medical care. °What side effects may I notice from receiving this medicine? °Side effects that you should report to your doctor or health care professional as soon as possible: °-allergic reactions like skin rash, itching or hives, swelling of the face, lips, or tongue °-breathing problems °-changes in vision °-chest pain °-dark urine °-depression, feelings of sadness °-dizziness °-general ill feeling or flu-like symptoms °-irregular, missed, or painful menstrual periods °-light-colored stools °-loss of appetite, nausea °-muscle weakness °-problems with balance, talking, or walking °-right upper belly pain °-unusually weak or tired °-yellowing of the eyes or skin °Side effects that usually do not require medical attention (report to your doctor or health care professional if they continue or are bothersome): °-aches, pains °-headache °-stomach upset °-tiredness °This list may not describe all possible side effects. Call your doctor for medical advice about side effects. You may report side effects to FDA at 1-800-FDA-1088. °Where should   I keep my medicine? °This drug is given in a hospital or clinic and will not be stored at  home. °NOTE: This sheet is a summary. It may not cover all possible information. If you have questions about this medicine, talk to your doctor, pharmacist, or health care provider. °© 2018 [employer] Standard (2008-02-11 13:33:21) ° °

## 2017-01-22 ENCOUNTER — Telehealth: Payer: Self-pay | Admitting: *Deleted

## 2017-01-22 ENCOUNTER — Encounter (HOSPITAL_COMMUNITY)
Admission: RE | Admit: 2017-01-22 | Discharge: 2017-01-22 | Disposition: A | Discharge status: Home / Self Care | Payer: Managed Care, Other (non HMO) | Source: Ambulatory Visit | Attending: Neurology | Admitting: Neurology

## 2017-01-22 ENCOUNTER — Encounter (HOSPITAL_COMMUNITY): Payer: Self-pay

## 2017-01-22 DIAGNOSIS — I10 Essential (primary) hypertension: Secondary | ICD-10-CM | POA: Diagnosis not present

## 2017-01-22 DIAGNOSIS — G35 Multiple sclerosis: Secondary | ICD-10-CM | POA: Insufficient documentation

## 2017-01-22 MED ORDER — SODIUM CHLORIDE 0.9 % IV SOLN
INTRAVENOUS | Status: DC
  Administered 2017-01-22: 09:00:00 via INTRAVENOUS

## 2017-01-22 MED ORDER — LORATADINE 10 MG PO TABS
10.0000 mg | ORAL_TABLET | Freq: Once | ORAL | Status: AC
  Administered 2017-01-22: 10 mg via ORAL
  Filled 2017-01-22: qty 1

## 2017-01-22 MED ORDER — ACETAMINOPHEN 500 MG PO TABS
1000.0000 mg | ORAL_TABLET | Freq: Once | ORAL | Status: AC
  Administered 2017-01-22: 1000 mg via ORAL
  Filled 2017-01-22: qty 2

## 2017-01-22 MED ORDER — SODIUM CHLORIDE 0.9 % IV SOLN
300.0000 mg | INTRAVENOUS | Status: DC
  Administered 2017-01-22: 300 mg via INTRAVENOUS
  Filled 2017-01-22: qty 15

## 2017-01-22 NOTE — Telephone Encounter (Signed)
PA for Tysabri: [id number] 01-22-17 TO 01-22-18

## 2017-01-22 NOTE — Progress Notes (Signed)
Patient did not stay for one hour observation post Tysabri. NS flush per order. Aware to call MD with any problems or questions.

## 2017-01-22 NOTE — Discharge Instructions (Signed)
°Tysabri °Natalizumab injection °What is this medicine? °NATALIZUMAB (na ta LIZ you mab) is used to treat relapsing multiple sclerosis. This drug is not a cure. It is also used to treat Crohn's disease. °This medicine may be used for other purposes; ask your health care provider or pharmacist if you have questions. °COMMON BRAND NAME(S): Tysabri °What should I tell my health care provider before I take this medicine? °They need to know if you have any of these conditions: °-immune system problems °-progressive multifocal leukoencephalopathy (PML) °-an unusual or allergic reaction to natalizumab, other medicines, foods, dyes, or preservatives °-pregnant or trying to get pregnant °-breast-feeding °How should I use this medicine? °This medicine is for infusion into a vein. It is given by a health care professional in a hospital or clinic setting. °A special MedGuide will be given to you by the pharmacist with each prescription and refill. Be sure to read this information carefully each time. °Talk to your pediatrician regarding the use of this medicine in children. This medicine is not approved for use in children. °Overdosage: If you think you have taken too much of this medicine contact a poison control center or emergency room at once. °NOTE: This medicine is only for you. Do not share this medicine with others. °What if I miss a dose? °It is important not to miss your dose. Call your doctor or health care professional if you are unable to keep an appointment. °What may interact with this medicine? °-azathioprine °-cyclosporine °-interferon °-6-mercaptopurine °-methotrexate °-steroid medicines like prednisone or cortisone °-TNF-alpha inhibitors like adalimumab, etanercept, and infliximab °-vaccines °This list may not describe all possible interactions. Give your health care provider a list of all the medicines, herbs, non-prescription drugs, or dietary supplements you use. Also tell them if you smoke, drink  alcohol, or use illegal drugs. Some items may interact with your medicine. °What should I watch for while using this medicine? °Your condition will be monitored carefully while you are receiving this medicine. Visit your doctor for regular check ups. Tell your doctor or healthcare professional if your symptoms do not start to get better or if they get worse. °Stay away from people who are sick. Call your doctor or health care professional for advice if you get a fever, chills or sore throat, or other symptoms of a cold or flu. Do not treat yourself. °In some patients, this medicine may cause a serious brain infection that may cause death. If you have any problems seeing, thinking, speaking, walking, or standing, tell your doctor right away. If you cannot reach your doctor, get urgent medical care. °What side effects may I notice from receiving this medicine? °Side effects that you should report to your doctor or health care professional as soon as possible: °-allergic reactions like skin rash, itching or hives, swelling of the face, lips, or tongue °-breathing problems °-changes in vision °-chest pain °-dark urine °-depression, feelings of sadness °-dizziness °-general ill feeling or flu-like symptoms °-irregular, missed, or painful menstrual periods °-light-colored stools °-loss of appetite, nausea °-muscle weakness °-problems with balance, talking, or walking °-right upper belly pain °-unusually weak or tired °-yellowing of the eyes or skin °Side effects that usually do not require medical attention (report to your doctor or health care professional if they continue or are bothersome): °-aches, pains °-headache °-stomach upset °-tiredness °This list may not describe all possible side effects. Call your doctor for medical advice about side effects. You may report side effects to FDA at 1-800-FDA-1088. °Where should I   keep my medicine? °This drug is given in a hospital or clinic and will not be stored at  home. °NOTE: This sheet is a summary. It may not cover all possible information. If you have questions about this medicine, talk to your doctor, pharmacist, or health care provider. °© 2018 [employer]/Gold Standard (2008-02-11 13:33:21) ° °

## 2017-02-12 ENCOUNTER — Ambulatory Visit: Payer: Managed Care, Other (non HMO) | Admitting: Neurology

## 2017-02-12 ENCOUNTER — Encounter: Payer: Self-pay | Admitting: Physical Therapy

## 2017-02-12 ENCOUNTER — Other Ambulatory Visit: Payer: Managed Care, Other (non HMO)

## 2017-02-12 ENCOUNTER — Other Ambulatory Visit: Payer: Self-pay

## 2017-02-12 ENCOUNTER — Encounter: Payer: Self-pay | Admitting: Neurology

## 2017-02-12 ENCOUNTER — Ambulatory Visit: Payer: Managed Care, Other (non HMO) | Attending: Neurology | Admitting: Physical Therapy

## 2017-02-12 VITALS — BP 120/84 | HR 102 | Ht 63.0 in | Wt 206.5 lb

## 2017-02-12 DIAGNOSIS — M6281 Muscle weakness (generalized): Secondary | ICD-10-CM | POA: Insufficient documentation

## 2017-02-12 DIAGNOSIS — R2689 Other abnormalities of gait and mobility: Secondary | ICD-10-CM | POA: Diagnosis present

## 2017-02-12 DIAGNOSIS — R296 Repeated falls: Secondary | ICD-10-CM | POA: Diagnosis present

## 2017-02-12 DIAGNOSIS — G35 Multiple sclerosis: Secondary | ICD-10-CM

## 2017-02-12 DIAGNOSIS — R29818 Other symptoms and signs involving the nervous system: Secondary | ICD-10-CM | POA: Insufficient documentation

## 2017-02-12 DIAGNOSIS — R2681 Unsteadiness on feet: Secondary | ICD-10-CM | POA: Insufficient documentation

## 2017-02-12 LAB — CBC WITH DIFFERENTIAL/PLATELET
BASOS ABS: 27 {cells}/uL (ref 0–200)
Basophils Relative: 0.4 %
EOS PCT: 1.9 %
Eosinophils Absolute: 129 cells/uL (ref 15–500)
HEMATOCRIT: 31.8 % — AB (ref 35.0–45.0)
HEMOGLOBIN: 10.6 g/dL — AB (ref 11.7–15.5)
LYMPHS ABS: 2931 {cells}/uL (ref 850–3900)
MCH: 27 pg (ref 27.0–33.0)
MCHC: 33.3 g/dL (ref 32.0–36.0)
MCV: 81.1 fL (ref 80.0–100.0)
MPV: 8.9 fL (ref 7.5–12.5)
Monocytes Relative: 6.9 %
NEUTROS ABS: 3244 {cells}/uL (ref 1500–7800)
Neutrophils Relative %: 47.7 %
Platelets: 329 10*3/uL (ref 140–400)
RBC: 3.92 10*6/uL (ref 3.80–5.10)
RDW: 13.9 % (ref 11.0–15.0)
Total Lymphocyte: 43.1 %
WBC: 6.8 10*3/uL (ref 3.8–10.8)
WBCMIX: 469 {cells}/uL (ref 200–950)

## 2017-02-12 LAB — COMPREHENSIVE METABOLIC PANEL
AG Ratio: 1.4 (calc) (ref 1.0–2.5)
ALT: 15 U/L (ref 6–29)
AST: 16 U/L (ref 10–30)
Albumin: 3.9 g/dL (ref 3.6–5.1)
Alkaline phosphatase (APISO): 39 U/L (ref 33–115)
BILIRUBIN TOTAL: 0.3 mg/dL (ref 0.2–1.2)
BUN: 10 mg/dL (ref 7–25)
CALCIUM: 9.3 mg/dL (ref 8.6–10.2)
CO2: 30 mmol/L (ref 20–32)
Chloride: 104 mmol/L (ref 98–110)
Creat: 0.64 mg/dL (ref 0.50–1.10)
Globulin: 2.7 g/dL (calc) (ref 1.9–3.7)
Glucose, Bld: 114 mg/dL — ABNORMAL HIGH (ref 65–99)
POTASSIUM: 4 mmol/L (ref 3.5–5.3)
SODIUM: 141 mmol/L (ref 135–146)
TOTAL PROTEIN: 6.6 g/dL (ref 6.1–8.1)

## 2017-02-12 MED ORDER — BACLOFEN 10 MG PO TABS: 10.0000 mg | ORAL_TABLET | Freq: Two times a day (BID) | ORAL | 3 refills | Status: DC

## 2017-02-12 NOTE — Progress Notes (Signed)
Follow-up Visit   Date: 02/12/17    Peggy Banks MRN: [id number] DOB: November 24, 1980   Interim History: Peggy Banks is a 37 y.o. right-handed African American female with hypertension returning to the clinic for follow-up of diagnosed multiple sclerosis.  The patient was accompanied to the clinic by self.    History of present illness: For the past 10 years, she has had a limp with her right leg and feels that is more effortful for her to move and often slaps the floor when she is walking.  Her leg also feels stiff and heavy.   There is numbness over the sole of the foot and involves the posterior lower calf.   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  Stretching sometimes helps.   During the latter half of 2017, she became more dependent on using grocery carts because it helps maintain balance when she walks.  She is able to walk unassisted, but feels more comfortable leaning on objects.  She has also noticed that right toes on the right foot do not move and her foot tends to want to curl inwards.    She recalls having right radicular pain over her thigh during her pregnancy with her son, but her current symptoms are nothing like this. She endorses low back pain.  MRI lumbar spine did not show any nerve impingement.  She denies any numbness/tingling or weakness of the right face and arm.   No history of vision loss.    Upon further questioning, she was told ~2005 that there was abnormal change on her MRI for the same complaints and multiple sclerosis was mentioned, but she was never given the diagnosis.  She did not have CSF testing.  She did see neurology for this but after she became pregnant, many of her symptoms improve, and she was lost to follow-up.   In early 2018, patient underwent imaging which showed diffuse white matter changes affecting the brain as well as cervical cord, consistent with multiple sclerosis.  There are  4 small foci of enhancement. She was treated with 3 days of Solu-Medrol, but did not notice any marked change.  Since starting baclofen, her muscle stiffness and cramps have reduced, especially in the right leg.  Unfortunately, her gait remains unchanged and is still unsteady, with dragging of the right foot. She was started Tysabri in Aptil 2018 and offered AFO for her right foot weakness.    UPDATE 10/02/2016:  She is here for 4 month follow-up visit and is doing well.  She continues to get Tysabri infusions monthly and is tolerating them well and feel that it is helping because her right foot is not as stiff as previously.  She is able to extend and flex her toes better than before.  Unfortunately, her right knee continues to hyperextend and we had referred her to PM&R for evaluation, but she never received a call to schedule the appointment.  UPDATE 02/12/2017:  She is here for follow-up visit.  She has not had any new neurological complaints and is tolerating Tysabri infusions well.  Her last labs shows negative JCV titer.  She denies any new weakness, numbness/tingling, or vision changes.  She had been having conjunctivitis for the past week and was treated with dry drops.  She had eye exam today which was normal. She also started physical therapy for right knee hyperextension today.  Medications:   Current Outpatient  Medications on File Prior to Visit  Medication Sig Dispense Refill  . AMBULATORY NON FORMULARY MEDICATION 1 Units by Other route daily. Right AFO 1 Units 0  . B Complex Vitamins (VITAMIN B COMPLEX PO) Take by mouth.    . Cholecalciferol (VITAMIN D PO) Take 5,000 Units by mouth.    . Ferrous Gluconate-C-Folic Acid (IRON-C PO) Take by mouth.    Marland Kitchen lisinopril-hydrochlorothiazide (PRINZIDE,ZESTORETIC) 10-12.5 MG tablet Take 1 tablet by mouth daily.    . Multiple Vitamin (MULTIVITAMIN) tablet Take 1 tablet by mouth daily.    . natalizumab (TYSABRI) 300 MG/15ML injection Inject into the vein  every 28 (twenty-eight) days.     No current facility-administered medications on file prior to visit.     Allergies:  Allergies  Allergen Reactions  . Cefdinir Swelling    Review of Systems:  CONSTITUTIONAL: No fevers, chills, night sweats, or weight loss.  EYES: No visual changes or eye pain ENT: No hearing changes.  No history of nose bleeds.   RESPIRATORY: No cough, wheezing and shortness of breath.   CARDIOVASCULAR: Negative for chest pain, and palpitations.   GI: Negative for abdominal discomfort, blood in stools or black stools.  No recent change in bowel habits.   GU:  No history of incontinence.   MUSCLOSKELETAL: No history of joint pain or swelling.  No myalgias.   SKIN: Negative for lesions, rash, and itching.   ENDOCRINE: Negative for cold or heat intolerance, polydipsia or goiter.   PSYCH:  No depression or anxiety symptoms.   NEURO: As Above.   Vital Signs:  BP 120/84   Pulse (!) 102   Ht 5\' 3"  (1.6 m)   Wt 206 lb 8 oz (93.7 kg)   SpO2 98%   BMI 36.58 kg/m   Neurological Exam: MENTAL STATUS including orientation to time, place, person, recent and remote memory, attention span and concentration, language, and fund of knowledge is normal.  Speech is not dysarthric.  CRANIAL NERVES:  Normal fundoscopic exam.  Pupils round and reactive to light.  Extraocular muscles intact.  Face is symmetric.  MOTOR:  Motor strength is 5/5 in all extremities, except right foot dorsiflexion 4+/5, eversion 4/5, inversion 4/5, toe extension and flexion 2+/5.  There mild RLE spasticity in the legs.  MSRs:  Right                                                                 Left brachioradialis 3+  brachioradialis 2+  biceps 3+  biceps 2+  triceps 3+  triceps 2+  patellar 3+  patellar 3+  ankle jerk 3+  ankle jerk 2+  There is no ankle clonus  SENSORY: Vibration is absent at the toes bilaterally  COORDINATION/GAIT:  Gait continues to show R knee hyperextension, mild  dragging of the right foot (improved)  Data: MRI brain personally reviewed dated 02/23/2006 which shows extensive scatter T2 hyperintensities in the subcortical and periventricular regions, with a larger confluent area on the left frontal horn and right posterior horn of the lateral ventricle.  There is also involvement of the corpus callosum. There is small foci of enhancement involving the subcortical parito-occiptal region.  MRI brain and cervical spine with and without contrast 02/07/2016:  Findings consistent with multiple sclerosis. Disease  is extensive throughout the brain and cervical cord and there is white matter atrophy. Four foci of enhancement/active demyelination are noted.  MRI thoracic spine wwo contrast 03/10/2016:  1. Widespread severe thoracic spinal cord signal abnormality compatible with chronic demyelinating disease. No areas of acute thoracic cord demyelination are identified. 2. Isolated small right paracentral thoracic disc herniation at T10-T11. Borderline to mild associated spinal stenosis at that level.   IMPRESSION/PLAN: Relpasing remitting multiple sclerosis with symptom manifestation in 2005, diagnosed in 2018.  Imaging confirmed multiple sclerosis with high disease burden involving juxtacortical, periventricular, brainstem, and cerebellum as well as short segment demyelinating changes affecting nearly every level of the cervical and thoracic spine.    On exam, she has right foot weakness and spasticity along with right knee hyperextension.  Her spasticity has improved with baclofen 10mg  BID which has also helped her gait; however, she continues to have hyperextension of the right knee and is seeing physical therapy for this.   She was started on Tysabri in April 2018 and has been tolerating this well, which will be continued.  Her last JCV titer was negative and will be rechecked in 3 months.    PLAN: 1.  Continue monthly Tysabri infusions 2.  Continue baclofen 10mg   twice daily  3.  MRI brain and cervical wwo contrast for annual surveillance 4.  Continue physical therapy for left knee hyperextension 5.  Check CBC with CMP 6.  Continue vitamin D 5000 units  Return to clinic in 3 months.   Greater than 50% of this 30 minute visit was spent in counseling, explanation of diagnosis, planning of further management, and coordination of care.   Thank you for allowing me to participate in patient's care.  If I can answer any additional questions, I would be pleased to do so.    Sincerely,    Donika K. Posey Pronto, DO

## 2017-02-12 NOTE — Patient Instructions (Addendum)
1.  Continue monthly Tysabri infusions 2.  Continue baclofen 10mg  twice daily  3.  MRI brain and cervical wwo contrast. We have sent a referral to [facility] for your MRI and they will call you directly to schedule your appt. They are located at [place]. If you need to contact them directly please call [phone]. 4.  Continue physical therapy for left knee hyperextension 5.  Check labs. Your provider has requested that you have labwork completed today. Please go to [facility] ([id number]) on the second floor of this building before leaving the office today. You do not need to check in. If you are not called within 15 minutes please check with the front desk.  6.  Continue vitamin D 5000 units  Return to clinic in May 16th at 3pm

## 2017-02-12 NOTE — Therapy (Signed)
[facility] [address] [place], [place], [address] Phone: [phone]   Fax:  [phone]  Physical Therapy Evaluation  Patient Details  Name: Peggy Banks MRN: [id number] Date of Birth: 01-05-1981 Referring Provider: Alda Berthold, DO   Encounter Date: 02/12/2017  PT End of Session - 02/12/17 1521    Visit Number  1    Number of Visits  9    Date for PT Re-Evaluation  04/13/17    Authorization Type  [employer] MANAGED    Authorization Time Period  60 visit limit    Authorization - Visit Number  1    Authorization - Number of Visits  60    PT Start Time  1150    PT Stop Time  1235    PT Time Calculation (min)  45 min    Activity Tolerance  Patient tolerated treatment well    Behavior During Therapy  [facility] for tasks assessed/performed       Past Medical History:  Diagnosis Date  . Hypertension   . MS (multiple sclerosis) ([place])     Past Surgical History:  Procedure Laterality Date  . CESAREAN SECTION    . EYE SURGERY     lasik bilat    There were no vitals filed for this visit.   Subjective Assessment - 02/12/17 1154    Subjective  Pt reports a year ago a new pain developed behind her knee and progressed to her low back, a band around her midsection, and an electric shock down her back when flexing her neck.  Pt also had multiple falls at work on stairs.  MS relapsing-remitting diagnosed by neurologist after MRI; pt first experienced these symptoms a few years prior but symptoms resolved during her pregnancy.  Pt is now on medication for MS and is noticing some improvement.  Still experiences mm fatigue with exertion, difficulty lifting LE to get dressed, has to lean on objects for prolonged standing, decreased endurance with community ambulation, shuffling gait but no recent falls and mild weakness in wrists.      Pertinent History  HTN, MS    Limitations  Walking;Standing    Patient Stated Goals  Improve her  balance and leg strength    Currently in Pain?  No/denies         [facility] PT Assessment - 02/12/17 1207      Assessment   Medical Diagnosis  MS    Referring Provider  Alda Berthold, DO    Onset Date/Surgical Date  10/06/16 date of referral    Prior Therapy  yes outpatient for strengthening before MS diagnosis      Precautions   Precautions  Other (comment) HTN, MS      Balance Screen   Has the patient fallen in the past 6 months  No    Has the patient had a decrease in activity level because of a fear of falling?   No    Is the patient reluctant to leave their home because of a fear of falling?   No      Home Environment   Living Environment  Private residence    Living Arrangements  Children;Parent Her Mother    Type of [place]  Level entry    [place]  None    Additional Comments  has 37 year old son; mom is living with and helping  patient currently      Prior Function   Level of Independence  Independent    Vocation  Full time employment    Vocation Requirements  Sits at a desk, typing, uses stairs at work      [employer]   Overall Cognitive Status  Within Functional Limits for tasks assessed      Observation/Other Assessments   Focus on Therapeutic Outcomes (FOTO)   not indicated for this diagnosis      Sensation   Light Touch  Appears Intact    Additional Comments  initially had tingling in feet but it is resolving.  Has had burning in quads after stairs      ROM / Strength   AROM / PROM / Strength  Strength      Strength   Overall Strength  Deficits    Overall Strength Comments  LLE: 4+/5; RLE: 3+/5 hip flexion, knee flexion, ankle DF, 4/5 knee extension      Ambulation/Gait   Ambulation/Gait  Yes    Ambulation/Gait Assistance  7: Independent    Ambulation Distance (Feet)  250 Feet    Assistive device  None    Gait Pattern  Decreased dorsiflexion - right;Right genu recurvatum;Right foot  flat;Step-through pattern    Ambulation Surface  Level;Indoor    Stairs  Yes    Stairs Assistance  6: Modified independent (Device/Increase time)    Stair Management Technique  One rail Right;Alternating pattern;Forwards    Number of Stairs  4    Height of Stairs  6    Gait Comments  burning in quads with stair negotiation      Standardized Balance Assessment   Standardized Balance Assessment  Five Times Sit to Stand;Dynamic Gait Index;10 meter walk test    Five times sit to stand comments   14.81 seconds, no UE support, quad burning R > L    10 Meter Walk  9.62 or 3.4 ft/sec      Dynamic Gait Index   Level Surface  Mild Impairment    Change in Gait Speed  Moderate Impairment    Gait with Horizontal Head Turns  Moderate Impairment    Gait with Vertical Head Turns  Mild Impairment    Gait and Pivot Turn  Normal    Step Over Obstacle  Moderate Impairment    Step Around Obstacles  Mild Impairment    Steps  Mild Impairment    Total Score  14    DGI comment:  14/24, <19/24 high falls risk             Objective measurements completed on examination: See above findings.              PT Education - 02/12/17 1521    Education provided  Yes    Education Details  clinical findings, PT POC and goals    Person(s) Educated  Patient    Methods  Explanation    Comprehension  Verbalized understanding       PT Short Term Goals - 02/12/17 1527      PT SHORT TERM GOAL #1   Title  Pt will demonstrate independence with initial HEP and walking program    Time  4    Period  Weeks    Status  New    Target Date  03/14/17      PT SHORT TERM GOAL #2   Title  Decrease five time sit to stand to </= 12 seconds for increased LE strength  Baseline  14.81    Time  4    Period  Weeks    Status  New    Target Date  03/14/17      PT SHORT TERM GOAL #3   Title  Will increase gait velocity to >/= 3.6 ft/sec    Baseline  3.4 ft/sec    Time  4    Period  Weeks    Target Date   03/14/17      PT SHORT TERM GOAL #4   Title  Will improve DGI to >/= 18/24     Baseline  14/24    Time  4    Period  Weeks    Status  New    Target Date  03/14/17      PT SHORT TERM GOAL #5   Title  Will negotiate 12 stairs with one rail, alternating sequence MOD I without reports of LE quad burning    Time  4    Period  Weeks    Status  New    Target Date  03/14/17        PT Long Term Goals - 02/12/17 1531      PT LONG TERM GOAL #1   Title  Pt will be independent with final HEP and walking program    Time  8    Period  Weeks    Status  New    Target Date  04/13/17      PT LONG TERM GOAL #2   Title  Pt will decrease five time sit to stand to </= 10 seconds    Time  8    Period  Weeks    Status  New    Target Date  04/13/17      PT LONG TERM GOAL #3   Title  Will increase gait velocity to >/= 3.8 ft/sec    Time  8    Period  Weeks    Status  New    Target Date  04/13/17      PT LONG TERM GOAL #4   Title  Pt will increase DGI >/= 22/24    Time  8    Period  Weeks    Status  New    Target Date  04/13/17      PT LONG TERM GOAL #5   Title  Pt will demonstrate improved RLE strength with hip flexion, knee flexion and ankle DF improving by one muscle grade    Baseline  3+/5    Time  8    Period  Weeks    Status  New    Target Date  04/13/17      Additional Long Term Goals   Additional Long Term Goals  Yes      PT LONG TERM GOAL #6   Title  Will ambulate 1000' on outdoor surfaces and negotiate 24 stairs with one rail, alternating sequence MOD I with decreased foot slap and recurvatum RLE    Time  8    Period  Weeks    Status  New    Target Date  04/13/17             Plan - 02/12/17 1522    Clinical Impression Statement  Pt is a 37 year old female referred to Neuro OPPT for evaluation of impairments related to MS.  Pt's PMH is significant for the following: HTN and MS. The following deficits were noted during pt's exam: impaired LE strength R >  L  weakness, impaired functional muscular endurance, impaired balance, and impaired gait.  Pt's gait speed indicates pt is safe for community ambulation but is below normal limits for young, community dwelling adult.  Pt's five time sit to stand and DGI score indicates pt is at risk for falls. Pt would benefit from skilled PT to address these impairments and functional limitations to maximize functional mobility independence and reduce falls risk.    History and Personal Factors relevant to plan of care:  Pt is single parent of 36 year old son, mother lives with her currently to help, works full time and has to climb multiple stairs at work, HTN, relapsing-remitting MS, multiple falls    Clinical Presentation  Stable    Clinical Presentation due to:  Pt is single parent of 14 year old son, mother lives with her currently to help, works full time and has to climb multiple stairs at work, HTN, relapsing-remitting MS, multiple falls    Clinical Decision Making  Low    Rehab Potential  Good    PT Frequency  1x / week    PT Duration  8 weeks    PT Treatment/Interventions  ADLs/Self Care Home Management;Aquatic Therapy;Electrical Stimulation;Gait training;Stair training;Functional mobility training;Therapeutic activities;Therapeutic exercise;Balance training;Neuromuscular re-education;Patient/family education;Orthotic Fit/Training;Passive range of motion;Energy conservation    PT Next Visit Plan  Bioness, initiate HEP focus on ankle DF, hamstring strength.  Work on stairs!    Consulted and Agree with Plan of Care  Patient       Patient will benefit from skilled therapeutic intervention in order to improve the following deficits and impairments:  Abnormal gait, Decreased activity tolerance, Decreased balance, Decreased endurance, Decreased strength, Difficulty walking  Visit Diagnosis: Repeated falls  Muscle weakness (generalized)  Other symptoms and signs involving the nervous system  Other abnormalities  of gait and mobility  Unsteadiness on feet     Problem List Patient Active Problem List   Diagnosis Date Noted  . Multiple sclerosis, relapsing-remitting ([place]) 02/27/2016  . Spasticity 01/31/2016  . Right leg weakness 01/31/2016    Rico Junker, PT, DPT 02/12/17    3:35 PM    [facility] [address] [place], [place], [address] Phone: [phone]   Fax:  [phone]  Name: Ma Munoz MRN: [id number] Date of Birth: 08/12/1980

## 2017-02-15 ENCOUNTER — Telehealth: Payer: Self-pay | Admitting: *Deleted

## 2017-02-15 NOTE — Telephone Encounter (Signed)
Left message giving patient results.  

## 2017-02-15 NOTE — Telephone Encounter (Signed)
-----   Message from Alda Berthold, DO sent at 02/15/2017  8:32 AM EST ----- Please notify patient lab are within normal limits.  Thank you.

## 2017-02-19 ENCOUNTER — Ambulatory Visit: Payer: Managed Care, Other (non HMO) | Admitting: Physical Therapy

## 2017-02-19 ENCOUNTER — Other Ambulatory Visit: Payer: Self-pay | Admitting: *Deleted

## 2017-02-19 DIAGNOSIS — G35 Multiple sclerosis: Secondary | ICD-10-CM

## 2017-02-22 ENCOUNTER — Other Ambulatory Visit: Payer: Self-pay | Admitting: *Deleted

## 2017-02-22 DIAGNOSIS — G35 Multiple sclerosis: Secondary | ICD-10-CM

## 2017-02-24 ENCOUNTER — Encounter (HOSPITAL_COMMUNITY): Payer: Self-pay

## 2017-02-24 ENCOUNTER — Encounter (HOSPITAL_COMMUNITY)
Admission: RE | Admit: 2017-02-24 | Discharge: 2017-02-24 | Disposition: A | Discharge status: Home / Self Care | Payer: Managed Care, Other (non HMO) | Source: Ambulatory Visit | Attending: Neurology | Admitting: Neurology

## 2017-02-24 DIAGNOSIS — G35 Multiple sclerosis: Secondary | ICD-10-CM

## 2017-02-24 DIAGNOSIS — I10 Essential (primary) hypertension: Secondary | ICD-10-CM | POA: Diagnosis not present

## 2017-02-24 MED ORDER — SODIUM CHLORIDE 0.9 % IV SOLN
300.0000 mg | Freq: Once | INTRAVENOUS | Status: AC
  Administered 2017-02-24: 300 mg via INTRAVENOUS
  Filled 2017-02-24: qty 15

## 2017-02-24 MED ORDER — ACETAMINOPHEN 500 MG PO TABS
1000.0000 mg | ORAL_TABLET | Freq: Once | ORAL | Status: AC
  Administered 2017-02-24: 1000 mg via ORAL
  Filled 2017-02-24: qty 2

## 2017-02-24 MED ORDER — SODIUM CHLORIDE 0.9 % IV SOLN
INTRAVENOUS | Status: AC
  Administered 2017-02-24: 09:00:00 via INTRAVENOUS

## 2017-02-24 NOTE — Progress Notes (Signed)
Pt took Zyrtec at home this am prior to coming to the hospital for Tysabri.  Claritin not given today.  Tolerated infusion without problems and stayed approximately 25 minutes of recommended 1 hour post observation time.

## 2017-02-26 ENCOUNTER — Ambulatory Visit: Payer: Managed Care, Other (non HMO) | Admitting: Rehabilitation

## 2017-03-05 ENCOUNTER — Encounter: Payer: Self-pay | Admitting: Rehabilitation

## 2017-03-05 ENCOUNTER — Ambulatory Visit: Payer: Managed Care, Other (non HMO) | Attending: Neurology | Admitting: Rehabilitation

## 2017-03-05 DIAGNOSIS — R2689 Other abnormalities of gait and mobility: Secondary | ICD-10-CM

## 2017-03-05 DIAGNOSIS — R296 Repeated falls: Secondary | ICD-10-CM | POA: Diagnosis not present

## 2017-03-05 DIAGNOSIS — R29818 Other symptoms and signs involving the nervous system: Secondary | ICD-10-CM | POA: Insufficient documentation

## 2017-03-05 DIAGNOSIS — M6281 Muscle weakness (generalized): Secondary | ICD-10-CM

## 2017-03-05 DIAGNOSIS — R2681 Unsteadiness on feet: Secondary | ICD-10-CM | POA: Diagnosis present

## 2017-03-05 NOTE — Therapy (Signed)
[facility] [address] [place], [place], [address] Phone: [phone]   Fax:  [phone]  Physical Therapy Treatment  Patient Details  Name: Peggy Banks MRN: [id number] Date of Birth: 1980/05/28 Referring Provider: Alda Berthold, DO   Encounter Date: 03/05/2017  PT End of Session - 03/05/17 1715    Visit Number  2    Number of Visits  9    Date for PT Re-Evaluation  04/13/17    Authorization Type  [employer] MANAGED    Authorization Time Period  60 visit limit    Authorization - Visit Number  2    Authorization - Number of Visits  60    PT Start Time  1610    PT Stop Time  1655    PT Time Calculation (min)  45 min    Activity Tolerance  Patient tolerated treatment well    Behavior During Therapy  [facility] for tasks assessed/performed       Past Medical History:  Diagnosis Date  . Hypertension   . MS (multiple sclerosis) ([place])     Past Surgical History:  Procedure Laterality Date  . CESAREAN SECTION    . EYE SURGERY     lasik bilat    There were no vitals filed for this visit.  Subjective Assessment - 03/05/17 1610    Subjective  Pt reports no changes, no falls since last visit.      Pertinent History  HTN, MS    Limitations  Walking;Standing    Patient Stated Goals  Improve her balance and leg strength    Currently in Pain?  No/denies                      [facility] Adult PT Treatment/Exercise - 03/05/17 1704      Ambulation/Gait   Ambulation/Gait  Yes    Ambulation/Gait Assistance  5: Supervision    Ambulation/Gait Assistance Details  Had pt ambulate x 230' to further assess RLE weakness.  Note that she does not catch R foot but demo's decreased active DF with no heel strike and also note decreased push off at terminal stance.  When cued for this during session, she tends to have more LOB, but with increased practice it did improve.  She reports that she has AFO for RLE at home but has never  worn.  Educated her to bring to next session so that we can work with it in therapy, but recommended low top sneakers for improved fit.  PT did trial brief bout of gait with use of PLS AFO on RLE.  Note marked improvement in foot clearance, however she continued to have recurvatum at knee esp with increased fatigue.  Would like to see if heel wedge at next visit would decrease this.      Ambulation Distance (Feet)  230 Feet then another 115'    Assistive device  None and R AFO    Gait Pattern  Decreased dorsiflexion - right;Right genu recurvatum;Right foot flat;Step-through pattern    Ambulation Surface  Level;Indoor    Stairs  Yes    Stairs Assistance  6: Modified independent (Device/Increase time)    Stair Management Technique  One rail Right;Alternating pattern;Forwards    Number of Stairs  4    Height of Stairs  6      Neuro Re-ed    Neuro Re-ed Details   For RLE NMR/strengthening: provided initial HEP, see pt instruction for details on exercises  performed and reps.               PT Education - 03/05/17 1714    Education provided  Yes    Education Details  initial HEP, purpose of AFO    Person(s) Educated  Patient    Methods  Explanation;Demonstration;Handout    Comprehension  Verbalized understanding;Returned demonstration       PT Short Term Goals - 02/12/17 1527      PT SHORT TERM GOAL #1   Title  Pt will demonstrate independence with initial HEP and walking program    Time  4    Period  Weeks    Status  New    Target Date  03/14/17      PT SHORT TERM GOAL #2   Title  Decrease five time sit to stand to </= 12 seconds for increased LE strength    Baseline  14.81    Time  4    Period  Weeks    Status  New    Target Date  03/14/17      PT SHORT TERM GOAL #3   Title  Will increase gait velocity to >/= 3.6 ft/sec    Baseline  3.4 ft/sec    Time  4    Period  Weeks    Target Date  03/14/17      PT SHORT TERM GOAL #4   Title  Will improve DGI to >/= 18/24      Baseline  14/24    Time  4    Period  Weeks    Status  New    Target Date  03/14/17      PT SHORT TERM GOAL #5   Title  Will negotiate 12 stairs with one rail, alternating sequence MOD I without reports of LE quad burning    Time  4    Period  Weeks    Status  New    Target Date  03/14/17        PT Long Term Goals - 02/12/17 1531      PT LONG TERM GOAL #1   Title  Pt will be independent with final HEP and walking program    Time  8    Period  Weeks    Status  New    Target Date  04/13/17      PT LONG TERM GOAL #2   Title  Pt will decrease five time sit to stand to </= 10 seconds    Time  8    Period  Weeks    Status  New    Target Date  04/13/17      PT LONG TERM GOAL #3   Title  Will increase gait velocity to >/= 3.8 ft/sec    Time  8    Period  Weeks    Status  New    Target Date  04/13/17      PT LONG TERM GOAL #4   Title  Pt will increase DGI >/= 22/24    Time  8    Period  Weeks    Status  New    Target Date  04/13/17      PT LONG TERM GOAL #5   Title  Pt will demonstrate improved RLE strength with hip flexion, knee flexion and ankle DF improving by one muscle grade    Baseline  3+/5    Time  8    Period  Weeks    Status  New    Target Date  04/13/17      Additional Long Term Goals   Additional Long Term Goals  Yes      PT LONG TERM GOAL #6   Title  Will ambulate 1000' on outdoor surfaces and negotiate 24 stairs with one rail, alternating sequence MOD I with decreased foot slap and recurvatum RLE    Time  8    Period  Weeks    Status  New    Target Date  04/13/17            Plan - 03/05/17 2113    Clinical Impression Statement  Skilled session focused on initiation of HEP for BLE NMR/strengthening.  Also continue to address gait deviations and trialed use of R PLS AFO as she reports she has this at home.  She is to bring her brace to next session for further assessment.      Rehab Potential  Good    PT Frequency  1x / week    PT Duration   8 weeks    PT Treatment/Interventions  ADLs/Self Care Home Management;Aquatic Therapy;Electrical Stimulation;Gait training;Stair training;Functional mobility training;Therapeutic activities;Therapeutic exercise;Balance training;Neuromuscular re-education;Patient/family education;Orthotic Fit/Training;Passive range of motion;Energy conservation    PT Next Visit Plan  [place], look at gait with her AFO, RLE NMR, Work on stairs!    Consulted and Agree with Plan of Care  Patient       Patient will benefit from skilled therapeutic intervention in order to improve the following deficits and impairments:  Abnormal gait, Decreased activity tolerance, Decreased balance, Decreased endurance, Decreased strength, Difficulty walking  Visit Diagnosis: Repeated falls  Muscle weakness (generalized)  Other symptoms and signs involving the nervous system  Other abnormalities of gait and mobility     Problem List Patient Active Problem List   Diagnosis Date Noted  . Multiple sclerosis, relapsing-remitting ([place]) 02/27/2016  . Spasticity 01/31/2016  . Right leg weakness 01/31/2016    Cameron Sprang, PT, MPT [facility] [address] [place], [place], [address] Phone: [phone]   Fax:  [phone] 03/05/17, 9:17 PM  Name: Peggy Banks MRN: [id number] Date of Birth: [birth year]

## 2017-03-05 NOTE — Patient Instructions (Addendum)
ANKLE: Dorsiflexion (Band)    Sit at edge of surface. Place band around top of foot. Keeping heel on floor, raise toes of banded foot. Hold _2-3__ seconds. Use ____red____ band. 10__ reps per set, __2_ sets per day, __5-7_ days per week  Copyright  VHI. All rights reserved.   ANKLE: Eversion, Bilateral (Band)    Place band around feet. Keeping heels on floor, raise toes of both feet up and away from body. Do not move hips. Hold _2-3__ seconds. Use __red______ band. _10__ reps per set, _2__ sets per day, _5-7__ days per week  Copyright  VHI. All rights reserved.   Heel Slide    Bend knee and pull heel toward buttocks. Hold __2-3__ seconds. Return. Do right side only.  Don't arch back, even if it means you don't lift knee as high.  Lower slowly.  Repeat _10___ times. Do __2__ sessions per day.  http://gt2.exer.us/372   Copyright  VHI. All rights reserved.   Hamstring Stretch    Sitting with operated leg straight on bed, and foot of other leg on floor, lean forward toward toes of straight leg. Hold __30-45__ seconds. Repeat __2__ times. After knee bend exercise as needed.   http://gt2.exer.us/303   Copyright  VHI. All rights reserved.    Sit to Stand: Head Upright    With head upright, stand up slowly with eyes open.  Try to find seat that is low enough for good challenge.  We did 15" in the clinic.   Repeat __10__ times per session. Do __2__ sessions per day.  Copyright  VHI. All rights reserved.     SUPINE HIP ABDUCTION - ELASTIC BAND CLAMS - [place]  Lie down on your back with your knees bent. Place an elastic band around your knees and then draw your knees apart. Do 10 times.

## 2017-03-06 ENCOUNTER — Other Ambulatory Visit: Payer: Managed Care, Other (non HMO)

## 2017-03-07 ENCOUNTER — Ambulatory Visit
Admission: RE | Admit: 2017-03-07 | Discharge: 2017-03-07 | Disposition: A | Discharge status: Home / Self Care | Payer: Managed Care, Other (non HMO) | Source: Ambulatory Visit | Attending: Neurology | Admitting: Neurology

## 2017-03-07 DIAGNOSIS — G35 Multiple sclerosis: Secondary | ICD-10-CM

## 2017-03-07 MED ORDER — GADOBENATE DIMEGLUMINE 529 MG/ML IV SOLN
19.0000 mL | Freq: Once | INTRAVENOUS | Status: AC | PRN
  Administered 2017-03-07: 19 mL via INTRAVENOUS

## 2017-03-09 ENCOUNTER — Other Ambulatory Visit: Payer: Self-pay | Admitting: *Deleted

## 2017-03-12 ENCOUNTER — Ambulatory Visit: Payer: Managed Care, Other (non HMO) | Admitting: Rehabilitation

## 2017-03-12 ENCOUNTER — Encounter: Payer: Self-pay | Admitting: Rehabilitation

## 2017-03-12 DIAGNOSIS — R2689 Other abnormalities of gait and mobility: Secondary | ICD-10-CM

## 2017-03-12 DIAGNOSIS — R296 Repeated falls: Secondary | ICD-10-CM | POA: Diagnosis not present

## 2017-03-12 DIAGNOSIS — M6281 Muscle weakness (generalized): Secondary | ICD-10-CM

## 2017-03-12 DIAGNOSIS — R29818 Other symptoms and signs involving the nervous system: Secondary | ICD-10-CM

## 2017-03-12 DIAGNOSIS — R2681 Unsteadiness on feet: Secondary | ICD-10-CM

## 2017-03-12 NOTE — Therapy (Signed)
[facility] [address] [place], [place], [address] Phone: [phone]   Fax:  [phone]  Physical Therapy Treatment  Patient Details  Name: Peggy Banks MRN: [id number] Date of Birth: 30-Nov-1980 Referring Provider: Alda Berthold, DO   Encounter Date: 03/12/2017  PT End of Session - 03/12/17 2137    Visit Number  3    Number of Visits  9    Date for PT Re-Evaluation  04/13/17    Authorization Type  [employer] MANAGED    Authorization Time Period  60 visit limit    Authorization - Visit Number  3    Authorization - Number of Visits  49    PT Start Time  3790 PT late from previous session    PT Stop Time  1703    PT Time Calculation (min)  38 min    Activity Tolerance  Patient tolerated treatment well    Behavior During Therapy  [facility] for tasks assessed/performed       Past Medical History:  Diagnosis Date  . Hypertension   . MS (multiple sclerosis) ([place])     Past Surgical History:  Procedure Laterality Date  . CESAREAN SECTION    . EYE SURGERY     lasik bilat    There were no vitals filed for this visit.  Subjective Assessment - 03/12/17 1628    Subjective  Pt reports no changes, no falls. Pt presents with her AFO received last year.      Pertinent History  HTN, MS    Limitations  Walking;Standing    Patient Stated Goals  Improve her balance and leg strength    Currently in Pain?  No/denies                      [facility] Adult PT Treatment/Exercise - 03/12/17 1620      Ambulation/Gait   Ambulation/Gait  Yes    Ambulation/Gait Assistance  5: Supervision    Ambulation/Gait Assistance Details  Assisted with donning pts personal brace (R blue rocker AFO) to assess with gait.  Note that this brace tends to increase R knee recurvatum and pt very uncomfortable walking with this brace.  Then switched back to R PLS AFO with small heel wedge and had pt ambulate again. Note marked improvement in fluidity  of gait however still note intermittent R knee recurvatum.  Will contact [employer] and see if they can come to therapy session to re-assess for new bracing options.      Ambulation Distance (Feet)  115 Feet x 2 reps    Assistive device  None    Gait Pattern  Decreased dorsiflexion - right;Right genu recurvatum;Right foot flat;Step-through pattern    Ambulation Surface  Level;Indoor      Neuro Re-ed    Neuro Re-ed Details   PT made several attempts during session to utilize R lower leg Bioness unit for DF assist however PT unable to get adequate contraction before getting either too much pain or spasmotic type movement in RLE.  Attempted to move unit laterally/medially but with no success.  Will attempt steering electrode at next session.  Continued with balance at end of session with toe walking and heel walking x 2 reps in // bars.  Progressed to performing step downs with RLE on 4" step lightly touching L heel to ground before extending RLE.  cues to prevent knee hyperextension.  PT Education - 03/12/17 2137    Education Details  use of Bioness, contacting [employer] to get new brace    Person(s) Educated  Patient    Methods  Explanation    Comprehension  Verbalized understanding       PT Short Term Goals - 02/12/17 1527      PT SHORT TERM GOAL #1   Title  Pt will demonstrate independence with initial HEP and walking program    Time  4    Period  Weeks    Status  New    Target Date  03/14/17      PT SHORT TERM GOAL #2   Title  Decrease five time sit to stand to </= 12 seconds for increased LE strength    Baseline  14.81    Time  4    Period  Weeks    Status  New    Target Date  03/14/17      PT SHORT TERM GOAL #3   Title  Will increase gait velocity to >/= 3.6 ft/sec    Baseline  3.4 ft/sec    Time  4    Period  Weeks    Target Date  03/14/17      PT SHORT TERM GOAL #4   Title  Will improve DGI to >/= 18/24     Baseline  14/24    Time  4    Period  Weeks     Status  New    Target Date  03/14/17      PT SHORT TERM GOAL #5   Title  Will negotiate 12 stairs with one rail, alternating sequence MOD I without reports of LE quad burning    Time  4    Period  Weeks    Status  New    Target Date  03/14/17        PT Long Term Goals - 02/12/17 1531      PT LONG TERM GOAL #1   Title  Pt will be independent with final HEP and walking program    Time  8    Period  Weeks    Status  New    Target Date  04/13/17      PT LONG TERM GOAL #2   Title  Pt will decrease five time sit to stand to </= 10 seconds    Time  8    Period  Weeks    Status  New    Target Date  04/13/17      PT LONG TERM GOAL #3   Title  Will increase gait velocity to >/= 3.8 ft/sec    Time  8    Period  Weeks    Status  New    Target Date  04/13/17      PT LONG TERM GOAL #4   Title  Pt will increase DGI >/= 22/24    Time  8    Period  Weeks    Status  New    Target Date  04/13/17      PT LONG TERM GOAL #5   Title  Pt will demonstrate improved RLE strength with hip flexion, knee flexion and ankle DF improving by one muscle grade    Baseline  3+/5    Time  8    Period  Weeks    Status  New    Target Date  04/13/17      Additional Long Term Goals   Additional  Long Term Goals  Yes      PT LONG TERM GOAL #6   Title  Will ambulate 1000' on outdoor surfaces and negotiate 24 stairs with one rail, alternating sequence MOD I with decreased foot slap and recurvatum RLE    Time  8    Period  Weeks    Status  New    Target Date  04/13/17            Plan - 03/12/17 2138    Clinical Impression Statement  Assessed gait using her blue rocker AFO, however this increased R knee recurvatum, therefore continued to assess PLS AFO with use of heel wedge.  Pt still with intermittent knee recurvatum, therefore will plan on calling [employer] to see if they can come to therapy session for new bracing options.  Also attempted use of Bioness, however unable to get adequate  contraction without pain.  Will attempt again at next session.     Rehab Potential  Good    PT Frequency  1x / week    PT Duration  8 weeks    PT Treatment/Interventions  ADLs/Self Care Home Management;Aquatic Therapy;Electrical Stimulation;Gait training;Stair training;Functional mobility training;Therapeutic activities;Therapeutic exercise;Balance training;Neuromuscular re-education;Patient/family education;Orthotic Fit/Training;Passive range of motion;Energy conservation    PT Next Visit Plan  Bioness-use steering electrodes, look at gait with her AFO, RLE NMR, Work on stairs!    Consulted and Agree with Plan of Care  Patient       Patient will benefit from skilled therapeutic intervention in order to improve the following deficits and impairments:  Abnormal gait, Decreased activity tolerance, Decreased balance, Decreased endurance, Decreased strength, Difficulty walking  Visit Diagnosis: Repeated falls  Muscle weakness (generalized)  Other symptoms and signs involving the nervous system  Other abnormalities of gait and mobility  Unsteadiness on feet     Problem List Patient Active Problem List   Diagnosis Date Noted  . Multiple sclerosis, relapsing-remitting ([place]) 02/27/2016  . Spasticity 01/31/2016  . Right leg weakness 01/31/2016    Cameron Sprang, PT, MPT [facility] [address] [place], [place], [address] Phone: [phone]   Fax:  [phone] 03/12/17, 9:41 PM  Name: Peggy Banks MRN: [id number] Date of Birth: 12-01-1980

## 2017-03-15 ENCOUNTER — Telehealth: Payer: Self-pay | Admitting: *Deleted

## 2017-03-15 NOTE — Telephone Encounter (Signed)
-----   Message from Alda Berthold, DO sent at 03/08/2017  8:12 AM EST ----- Please inform patient that her MRI brain and cervical spine shows one tiny focus of active disease on the brain, and otherwise stable as compared to last year.  We will continue Tysabri as planned.  You may need to call her, not sure if she is active on Mychart. Thanks.

## 2017-03-15 NOTE — Telephone Encounter (Signed)
Left message giving patient results and instructions.   

## 2017-03-19 ENCOUNTER — Encounter: Payer: Self-pay | Admitting: Rehabilitation

## 2017-03-19 ENCOUNTER — Ambulatory Visit: Payer: Managed Care, Other (non HMO) | Admitting: Rehabilitation

## 2017-03-19 DIAGNOSIS — R296 Repeated falls: Secondary | ICD-10-CM | POA: Diagnosis not present

## 2017-03-19 DIAGNOSIS — R2681 Unsteadiness on feet: Secondary | ICD-10-CM

## 2017-03-19 DIAGNOSIS — R29818 Other symptoms and signs involving the nervous system: Secondary | ICD-10-CM

## 2017-03-19 DIAGNOSIS — R2689 Other abnormalities of gait and mobility: Secondary | ICD-10-CM

## 2017-03-19 DIAGNOSIS — M6281 Muscle weakness (generalized): Secondary | ICD-10-CM

## 2017-03-19 NOTE — Therapy (Signed)
[facility] [address] [place], [place], [address] Phone: [phone]   Fax:  [phone]  Physical Therapy Treatment  Patient Details  Name: Peggy Banks MRN: [id number] Date of Birth: 1980/06/26 Referring Provider: Alda Berthold, DO   Encounter Date: 03/19/2017  PT End of Session - 03/19/17 1745    Visit Number  4    Number of Visits  9    Date for PT Re-Evaluation  04/13/17    Authorization Type  [employer] MANAGED    Authorization Time Period  60 visit limit    Authorization - Visit Number  4    Authorization - Number of Visits  60    PT Start Time  1616    PT Stop Time  1700    PT Time Calculation (min)  44 min    Activity Tolerance  Patient tolerated treatment well    Behavior During Therapy  WFL for tasks assessed/performed       Past Medical History:  Diagnosis Date  . Hypertension   . MS (multiple sclerosis) ([place])     Past Surgical History:  Procedure Laterality Date  . CESAREAN SECTION    . EYE SURGERY     lasik bilat    There were no vitals filed for this visit.  Subjective Assessment - 03/19/17 1619    Subjective  Pt reports no changes, no falls.      Pertinent History  HTN, MS    Limitations  Walking;Standing    Patient Stated Goals  Improve her balance and leg strength    Currently in Pain?  No/denies                      [facility] Adult PT Treatment/Exercise - 03/19/17 1638      Ambulation/Gait   Ambulation/Gait  Yes    Ambulation/Gait Assistance  5: Supervision;4: Min guard    Ambulation/Gait Assistance Details  Continue to assess bracing during our session.  Continued with PLS AFO with heel wedge x 300'.  Pt with improved foot clearance however noted intermittent knee hyperextension, esp with increased fatigue.  Trialed use of reaction AFO to determine if spring loaded mechanism would assist clearance (with heel wedge to prevent hyperextension) x 115'.  She continued to  demonstrate intermittent recurvatum.  maintained heel wedge in R shoe to assist with recurvatum until brace can be obtained.      Ambulation Distance (Feet)  -- see above    Assistive device  None    Gait Pattern  Decreased dorsiflexion - right;Right genu recurvatum;Right foot flat;Step-through pattern    Ambulation Surface  Level;Indoor    Gait Comments  discussed bracing options and feel that she may have to get a more custom posterior brace to provide adequate ankle support as well as knee support.  Pt verbalized understanding.        Self-Care   Self-Care  Other Self-Care Comments    Other Self-Care Comments   Pt emotional during session regarding MS disease process and effect it has made on her leg and balance.  Discussed and provided empathy as able.  Pt reports having handouts of MS society and support groups.  Encouraged her to get involved and find peer support.  Also encouraged her to read her handouts on MS disease process and what to expect so that she may better explain to family and friends who doesn't fully understand whats "wrong with her."  Educated that [employer] has  swimming classes geared towards MS patients, which would be another good outlet to meet other people with MS.  Pt verbalized understanding and agreed to ask questions if needed more resources.        Neuro Re-ed    Neuro Re-ed Details   In // bars for improved R knee and ankle control.  Standing on small rocker board with BLEs, maintaining balance x 30 secs> R SLS on small rocker board with UE support moving board forwards/backwards with emphasis on maintaining knee extension but avoiding recurvatum.  Performed x 2 sets of 10 reps.  R terminal knee extension with use of black theraband x 10 reps with cues for decreased trunk movement to better isolate quad control.  Placing R LE in stance on blue part of BOSU advancing and retrostepping LLE over BOSU with emphasis on knee and ankle control.  Pt needing heavy UE support  with cues to decrease as able.  Only one instance of recurvatum noted.  Maintaining squat position while shifting laterally with increased R lateral weight shift and return to stand with cues for increased R knee extension.               PT Education - 03/19/17 1745    Education provided  Yes    Education Details  see self care    Person(s) Educated  Patient    Methods  Explanation    Comprehension  Verbalized understanding       PT Short Term Goals - 02/12/17 1527      PT SHORT TERM GOAL #1   Title  Pt will demonstrate independence with initial HEP and walking program    Time  4    Period  Weeks    Status  New    Target Date  03/14/17      PT SHORT TERM GOAL #2   Title  Decrease five time sit to stand to </= 12 seconds for increased LE strength    Baseline  14.81    Time  4    Period  Weeks    Status  New    Target Date  03/14/17      PT SHORT TERM GOAL #3   Title  Will increase gait velocity to >/= 3.6 ft/sec    Baseline  3.4 ft/sec    Time  4    Period  Weeks    Target Date  03/14/17      PT SHORT TERM GOAL #4   Title  Will improve DGI to >/= 18/24     Baseline  14/24    Time  4    Period  Weeks    Status  New    Target Date  03/14/17      PT SHORT TERM GOAL #5   Title  Will negotiate 12 stairs with one rail, alternating sequence MOD I without reports of LE quad burning    Time  4    Period  Weeks    Status  New    Target Date  03/14/17        PT Long Term Goals - 02/12/17 1531      PT LONG TERM GOAL #1   Title  Pt will be independent with final HEP and walking program    Time  8    Period  Weeks    Status  New    Target Date  04/13/17      PT LONG TERM GOAL #2   Title  Pt will decrease five time sit to stand to </= 10 seconds    Time  8    Period  Weeks    Status  New    Target Date  04/13/17      PT LONG TERM GOAL #3   Title  Will increase gait velocity to >/= 3.8 ft/sec    Time  8    Period  Weeks    Status  New    Target Date   04/13/17      PT LONG TERM GOAL #4   Title  Pt will increase DGI >/= 22/24    Time  8    Period  Weeks    Status  New    Target Date  04/13/17      PT LONG TERM GOAL #5   Title  Pt will demonstrate improved RLE strength with hip flexion, knee flexion and ankle DF improving by one muscle grade    Baseline  3+/5    Time  8    Period  Weeks    Status  New    Target Date  04/13/17      Additional Long Term Goals   Additional Long Term Goals  Yes      PT LONG TERM GOAL #6   Title  Will ambulate 1000' on outdoor surfaces and negotiate 24 stairs with one rail, alternating sequence MOD I with decreased foot slap and recurvatum RLE    Time  8    Period  Weeks    Status  New    Target Date  04/13/17            Plan - 03/19/17 1745    Clinical Impression Statement  Skilled session focused on NMR for improved R knee and ankle control as well as gait with varying bracing options.  Feel she may need more custom posterior brace to better control knee.      Rehab Potential  Good    PT Frequency  1x / week    PT Duration  8 weeks    PT Treatment/Interventions  ADLs/Self Care Home Management;Aquatic Therapy;Electrical Stimulation;Gait training;Stair training;Functional mobility training;Therapeutic activities;Therapeutic exercise;Balance training;Neuromuscular re-education;Patient/family education;Orthotic Fit/Training;Passive range of motion;Energy conservation    PT Next Visit Plan  Bioness-use steering electrodes, look at gait with her AFO, RLE NMR, Work on stairs!    Consulted and Agree with Plan of Care  Patient       Patient will benefit from skilled therapeutic intervention in order to improve the following deficits and impairments:  Abnormal gait, Decreased activity tolerance, Decreased balance, Decreased endurance, Decreased strength, Difficulty walking  Visit Diagnosis: Repeated falls  Muscle weakness (generalized)  Other abnormalities of gait and mobility  Unsteadiness  on feet  Other symptoms and signs involving the nervous system     Problem List Patient Active Problem List   Diagnosis Date Noted  . Multiple sclerosis, relapsing-remitting ([place]) 02/27/2016  . Spasticity 01/31/2016  . Right leg weakness 01/31/2016    Cameron Sprang, PT, MPT [facility] [address] [place], [place], [address] Phone: [phone]   Fax:  [phone] 03/19/17, 5:47 PM  Name: Karena Kinker MRN: [id number] Date of Birth: 1980-05-30

## 2017-03-24 ENCOUNTER — Encounter (HOSPITAL_COMMUNITY)
Admission: RE | Admit: 2017-03-24 | Discharge: 2017-03-24 | Disposition: A | Discharge status: Home / Self Care | Payer: Managed Care, Other (non HMO) | Source: Ambulatory Visit | Attending: Neurology | Admitting: Neurology

## 2017-03-24 ENCOUNTER — Encounter (HOSPITAL_COMMUNITY): Payer: Self-pay

## 2017-03-24 DIAGNOSIS — G35 Multiple sclerosis: Secondary | ICD-10-CM | POA: Diagnosis not present

## 2017-03-24 DIAGNOSIS — I10 Essential (primary) hypertension: Secondary | ICD-10-CM | POA: Diagnosis not present

## 2017-03-24 MED ORDER — SODIUM CHLORIDE 0.9 % IV SOLN
INTRAVENOUS | Status: AC
  Administered 2017-03-24: 08:00:00 via INTRAVENOUS

## 2017-03-24 MED ORDER — SODIUM CHLORIDE 0.9 % IV SOLN: 300.0000 mg | Freq: Once | INTRAVENOUS | Status: DC

## 2017-03-24 MED ORDER — SODIUM CHLORIDE 0.9 % IV SOLN
300.0000 mg | INTRAVENOUS | Status: DC
Start: 2017-03-24 — End: 2017-03-25
  Administered 2017-03-24: 300 mg via INTRAVENOUS
  Filled 2017-03-24: qty 15

## 2017-03-24 MED ORDER — LORATADINE 10 MG PO TABS
10.0000 mg | ORAL_TABLET | Freq: Once | ORAL | Status: AC
  Administered 2017-03-24: 10 mg via ORAL
  Filled 2017-03-24: qty 1

## 2017-03-24 MED ORDER — ACETAMINOPHEN 500 MG PO TABS
1000.0000 mg | ORAL_TABLET | Freq: Once | ORAL | Status: AC
  Administered 2017-03-24: 1000 mg via ORAL
  Filled 2017-03-24: qty 2

## 2017-03-24 NOTE — Progress Notes (Signed)
Post-infusion Tysabri, patient stayed 50 minutes, patient to follow-up with doctor with any problems.

## 2017-03-24 NOTE — Discharge Instructions (Signed)

## 2017-03-25 ENCOUNTER — Telehealth: Payer: Self-pay | Admitting: Rehabilitation

## 2017-03-25 ENCOUNTER — Other Ambulatory Visit: Payer: Self-pay | Admitting: *Deleted

## 2017-03-25 DIAGNOSIS — R269 Unspecified abnormalities of gait and mobility: Secondary | ICD-10-CM

## 2017-03-25 MED ORDER — AMBULATORY NON FORMULARY MEDICATION: 1.0000 [IU] | Freq: Every day | 0 refills | Status: DC

## 2017-03-25 NOTE — Telephone Encounter (Signed)
Rx in epic and faxed.

## 2017-03-25 NOTE — Telephone Encounter (Signed)
Dr. Posey Pronto,   I am seeing Barton Dubois at [facility] for PT. Note that she previously had an AFO for RLE, however it is no longer appropriate for her and she needs a different AFO.  Please write new order in Epic for R AFO for gait abnormalities.    Thanks,  Cameron Sprang, PT, MPT [facility] [address] [place], [place], [address] Phone: [phone]   Fax:  [phone] 03/25/17, 9:53 AM

## 2017-03-25 NOTE — Telephone Encounter (Signed)
Peggy Banks, please send Rx for R AFO as noted below. Thanks.

## 2017-03-26 ENCOUNTER — Ambulatory Visit: Payer: Managed Care, Other (non HMO) | Admitting: Rehabilitation

## 2017-03-26 ENCOUNTER — Encounter: Payer: Self-pay | Admitting: Rehabilitation

## 2017-03-26 DIAGNOSIS — R296 Repeated falls: Secondary | ICD-10-CM

## 2017-03-26 DIAGNOSIS — M6281 Muscle weakness (generalized): Secondary | ICD-10-CM

## 2017-03-26 DIAGNOSIS — R2681 Unsteadiness on feet: Secondary | ICD-10-CM

## 2017-03-26 DIAGNOSIS — R2689 Other abnormalities of gait and mobility: Secondary | ICD-10-CM

## 2017-03-26 NOTE — Patient Instructions (Signed)
ANKLE: Dorsiflexion (Band)    Sit at edge of surface. Place band around top of foot. Keeping heel on floor, raise toes of banded foot. Hold _2-3__ seconds. Use ____red____ band. 10__ reps per set, __2_ sets per day, __5-7_ days per week  Copyright  VHI. All rights reserved.   ANKLE: Eversion, Bilateral (Band)    Place band around feet. Keeping heels on floor, raise toes of both feet up and away from body. Do not move hips. Hold _2-3__ seconds. Use __red______ band. _10__ reps per set, _2__ sets per day, _5-7__ days per week  Copyright  VHI. All rights reserved.   Prone Knee Flexion    Bend right knee, bringing heel toward buttocks. Hold _2-3___ seconds, then straighten. Can do both sides, but esp R side.  Repeat _10___ times. Do __1-2__ sessions per day.     http://gt2.exer.us/372   Copyright  VHI. All rights reserved.   Hamstring Stretch    Sitting with operated leg straight on bed, and foot of other leg on floor, lean forward toward toes of straight leg. Hold __30-45__ seconds. Repeat __2__ times. After knee bend exercise as needed.   http://gt2.exer.us/303   Copyright  VHI. All rights reserved.    Sit to Stand: Head Upright    With head upright, stand up slowly with eyes open.  Try to find seat that is low enough for good challenge.  We did 15" in the clinic.   Repeat __10__ times per session. Do __2__ sessions per day.  Copyright  VHI. All rights reserved.     SUPINE HIP ABDUCTION - ELASTIC BAND CLAMS - [place]  Lie down on your back with your knees bent. Place an elastic band around your knees and then draw your knees apart. Do 10 times.

## 2017-03-27 NOTE — Therapy (Signed)
[facility] [address] [place], [place], [address] Phone: [phone]   Fax:  [phone]  Physical Therapy Treatment  Patient Details  Name: Peggy Banks MRN: [id number] Date of Birth: July 31, 1980 Referring Provider: Alda Berthold, DO   Encounter Date: 03/26/2017  PT End of Session - 03/26/17 1640    Visit Number  5    Number of Visits  9    Date for PT Re-Evaluation  04/13/17    Authorization Type  [employer] MANAGED    Authorization Time Period  60 visit limit    Authorization - Visit Number  5    Authorization - Number of Visits  60    PT Start Time  5361    PT Stop Time  1700    PT Time Calculation (min)  45 min    Activity Tolerance  Patient tolerated treatment well    Behavior During Therapy  [facility] for tasks assessed/performed       Past Medical History:  Diagnosis Date  . Hypertension   . MS (multiple sclerosis) ([place])     Past Surgical History:  Procedure Laterality Date  . CESAREAN SECTION    . EYE SURGERY     lasik bilat    There were no vitals filed for this visit.  Subjective Assessment - 03/26/17 1615    Subjective  Pt reports a near fall getting out of car on Wednesday, was able to catch her self.  No injury.     Pertinent History  HTN, MS    Limitations  Walking;Standing    Patient Stated Goals  Improve her balance and leg strength    Currently in Pain?  Yes    Pain Score  5     Pain Location  Leg    Pain Orientation  Left thigh    Pain Descriptors / Indicators  Tightness    Pain Type  Acute pain    Pain Onset  Yesterday    Pain Frequency  Intermittent    Aggravating Factors   sitting    Pain Relieving Factors  walking                No data recorded       [facility] Adult PT Treatment/Exercise - 03/26/17 1622      Neuro Re-ed    Neuro Re-ed Details   Tall kneeling to L half kneeling transitions x 10 reps with UE support from PT.  Cues for improved activation and  stabilization in R hip.        Exercises   Exercises  Other Exercises    Other Exercises   BLE bridging x 5 reps with arms by side>5 reps with arms clasped above chest.  Progressed to BLE bridging on red physioball with arms by side x 10 reps with cues for decreased lateral movement,  clam abd x 10 reps on R side, SL straight leg hip abd x 10 reps on LLE.  Quadruped alternating LE extension x 10 reps total (5 on each side).  Plank on elbows x 5 reps with 5 sec holds, prone L hip ext with knee flex x 10 reps, RLE prone knee flex x 10 reps and prone RLE straight leg extension x 10 reps.              PT Education - 03/27/17 [phone]    Education provided  Yes    Education Details  added prone knee flex to HEP  Person(s) Educated  Patient    Methods  Explanation;Demonstration;Handout    Comprehension  Verbalized understanding;Returned demonstration       PT Short Term Goals - 02/12/17 1527      PT SHORT TERM GOAL #1   Title  Pt will demonstrate independence with initial HEP and walking program    Time  4    Period  Weeks    Status  New    Target Date  03/14/17      PT SHORT TERM GOAL #2   Title  Decrease five time sit to stand to </= 12 seconds for increased LE strength    Baseline  14.81    Time  4    Period  Weeks    Status  New    Target Date  03/14/17      PT SHORT TERM GOAL #3   Title  Will increase gait velocity to >/= 3.6 ft/sec    Baseline  3.4 ft/sec    Time  4    Period  Weeks    Target Date  03/14/17      PT SHORT TERM GOAL #4   Title  Will improve DGI to >/= 18/24     Baseline  14/24    Time  4    Period  Weeks    Status  New    Target Date  03/14/17      PT SHORT TERM GOAL #5   Title  Will negotiate 12 stairs with one rail, alternating sequence MOD I without reports of LE quad burning    Time  4    Period  Weeks    Status  New    Target Date  03/14/17        PT Long Term Goals - 02/12/17 1531      PT LONG TERM GOAL #1   Title  Pt will be  independent with final HEP and walking program    Time  8    Period  Weeks    Status  New    Target Date  04/13/17      PT LONG TERM GOAL #2   Title  Pt will decrease five time sit to stand to </= 10 seconds    Time  8    Period  Weeks    Status  New    Target Date  04/13/17      PT LONG TERM GOAL #3   Title  Will increase gait velocity to >/= 3.8 ft/sec    Time  8    Period  Weeks    Status  New    Target Date  04/13/17      PT LONG TERM GOAL #4   Title  Pt will increase DGI >/= 22/24    Time  8    Period  Weeks    Status  New    Target Date  04/13/17      PT LONG TERM GOAL #5   Title  Pt will demonstrate improved RLE strength with hip flexion, knee flexion and ankle DF improving by one muscle grade    Baseline  3+/5    Time  8    Period  Weeks    Status  New    Target Date  04/13/17      Additional Long Term Goals   Additional Long Term Goals  Yes      PT LONG TERM GOAL #6   Title  Will ambulate 1000'  on outdoor surfaces and negotiate 24 stairs with one rail, alternating sequence MOD I with decreased foot slap and recurvatum RLE    Time  8    Period  Weeks    Status  New    Target Date  04/13/17            Plan - 03/27/17 0939    Clinical Impression Statement  Skilled session focused on BLE strengthening, esp in proximal hip muscles as well as core strengthening.  Pt tolerated well but with moderate fatigue.  Note that PT faxed AFO order to [employer] prefers to have pt call and schedule appt for new AFO.  Pt given this information during session.      Rehab Potential  Good    PT Frequency  1x / week    PT Duration  8 weeks    PT Treatment/Interventions  ADLs/Self Care Home Management;Aquatic Therapy;Electrical Stimulation;Gait training;Stair training;Functional mobility training;Therapeutic activities;Therapeutic exercise;Balance training;Neuromuscular re-education;Patient/family education;Orthotic Fit/Training;Passive range of motion;Energy  conservation    PT Next Visit Plan  STGs, stool hamstring pulls, see if she got in with [employer] for new brace, check HEP, Bioness-use steering electrodes, RLE NMR, Work on stairs!    Consulted and Agree with Plan of Care  Patient       Patient will benefit from skilled therapeutic intervention in order to improve the following deficits and impairments:  Abnormal gait, Decreased activity tolerance, Decreased balance, Decreased endurance, Decreased strength, Difficulty walking  Visit Diagnosis: Repeated falls  Muscle weakness (generalized)  Other abnormalities of gait and mobility  Unsteadiness on feet     Problem List Patient Active Problem List   Diagnosis Date Noted  . Multiple sclerosis, relapsing-remitting ([place]) 02/27/2016  . Spasticity 01/31/2016  . Right leg weakness 01/31/2016    Cameron Sprang, PT, MPT [facility] [address] [place], [place], [address] Phone: [phone]   Fax:  [phone] 03/27/17, 9:44 AM  Name: Derriona Branscom MRN: [id number] Date of Birth: [birth year]

## 2017-04-02 ENCOUNTER — Encounter: Payer: Self-pay | Admitting: Rehabilitation

## 2017-04-02 ENCOUNTER — Ambulatory Visit: Payer: Managed Care, Other (non HMO) | Admitting: Rehabilitation

## 2017-04-02 DIAGNOSIS — M6281 Muscle weakness (generalized): Secondary | ICD-10-CM

## 2017-04-02 DIAGNOSIS — R296 Repeated falls: Secondary | ICD-10-CM

## 2017-04-02 DIAGNOSIS — R2681 Unsteadiness on feet: Secondary | ICD-10-CM

## 2017-04-02 DIAGNOSIS — R2689 Other abnormalities of gait and mobility: Secondary | ICD-10-CM

## 2017-04-02 DIAGNOSIS — R29818 Other symptoms and signs involving the nervous system: Secondary | ICD-10-CM

## 2017-04-02 NOTE — Therapy (Signed)
[facility] [address] [place], [place], [address] Phone: [phone]   Fax:  [phone]  Physical Therapy Treatment  Patient Details  Name: Peggy Banks MRN: [id number] Date of Birth: [birth year] Referring Provider: Alda Berthold, DO   Encounter Date: 04/02/2017  PT End of Session - 04/02/17 2210    Visit Number  6    Number of Visits  9    Date for PT Re-Evaluation  04/13/17    Authorization Type  [employer] MANAGED    Authorization Time Period  60 visit limit    Authorization - Visit Number  6    Authorization - Number of Visits  60    PT Start Time  2671    PT Stop Time  1655    PT Time Calculation (min)  45 min    Activity Tolerance  Patient tolerated treatment well    Behavior During Therapy  [facility] for tasks assessed/performed       Past Medical History:  Diagnosis Date  . Hypertension   . MS (multiple sclerosis) ([place])     Past Surgical History:  Procedure Laterality Date  . CESAREAN SECTION    . EYE SURGERY     lasik bilat    There were no vitals filed for this visit.  Subjective Assessment - 04/02/17 1659    Subjective  Pt reports she has not called [employer] yet.     Pertinent History  HTN, MS    Limitations  Walking;Standing    Patient Stated Goals  Improve her balance and leg strength    Currently in Pain?  No/denies                No data recorded       [facility] Adult PT Treatment/Exercise - 04/02/17 1620      Ambulation/Gait   Ambulation/Gait  Yes    Ambulation/Gait Assistance  5: Supervision;4: Min guard    Ambulation/Gait Assistance Details  Utilized Bioness lower leg and upper leg unit for RLE during gait.  Lower unit for improved DF assist and decreased foot slap and thigh unit on hamstrings during stance to prevent hyperextension.   She continues to be sensitive to Bioness unit, however was able to increase within session with gait and also with WB tasks.  Feel that DF  control was difficult to obtain, however did feel that with increased intensity she did have some decreased hyperextension.  Performed several laps during session (6 laps x 115' each) adjusting modes for intensity as able.  Cues for improved foot alignment and upright posture throughout.      Ambulation Distance (Feet)  115 Feet x 6 reps    Assistive device  None    Gait Pattern  Decreased dorsiflexion - right;Right genu recurvatum;Right foot flat;Step-through pattern    Ambulation Surface  Level;Indoor      Self-Care   Self-Care  Other Self-Care Comments    Other Self-Care Comments   Discussed keeping visit next week to wrap up and place pt on hold until she can get brace.  Educated that pt would need to call and schedule single visit following getting brace and that we would schedule out from that visit as needed.  Pt verbalized understanding.       Neuro Re-ed    Neuro Re-ed Details   Performed step up with RLE maintaining stance on 4" step with activation of Bioness unit.  Within this task, PT able to increase 2-3 units of  intensity on Bioness setting.        Modalities   Modalities  Teacher, English as a foreign language Location  R LE lower leg and thigh unit on hamstrings    Electrical Stimulation Action  R DF assist/eccentric control and R hamstrings during stance to prevent hyperextension.     Electrical Stimulation Parameters  See tablet    Electrical Stimulation Goals  Neuromuscular facilitation             PT Education - 04/02/17 2210    Education provided  Yes    Education Details  use of Bioness    Person(s) Educated  Patient    Methods  Explanation    Comprehension  Verbalized understanding       PT Short Term Goals - 02/12/17 1527      PT SHORT TERM GOAL #1   Title  Pt will demonstrate independence with initial HEP and walking program    Time  4    Period  Weeks    Status  New    Target Date  03/14/17      PT SHORT  TERM GOAL #2   Title  Decrease five time sit to stand to </= 12 seconds for increased LE strength    Baseline  14.81    Time  4    Period  Weeks    Status  New    Target Date  03/14/17      PT SHORT TERM GOAL #3   Title  Will increase gait velocity to >/= 3.6 ft/sec    Baseline  3.4 ft/sec    Time  4    Period  Weeks    Target Date  03/14/17      PT SHORT TERM GOAL #4   Title  Will improve DGI to >/= 18/24     Baseline  14/24    Time  4    Period  Weeks    Status  New    Target Date  03/14/17      PT SHORT TERM GOAL #5   Title  Will negotiate 12 stairs with one rail, alternating sequence MOD I without reports of LE quad burning    Time  4    Period  Weeks    Status  New    Target Date  03/14/17        PT Long Term Goals - 02/12/17 1531      PT LONG TERM GOAL #1   Title  Pt will be independent with final HEP and walking program    Time  8    Period  Weeks    Status  New    Target Date  04/13/17      PT LONG TERM GOAL #2   Title  Pt will decrease five time sit to stand to </= 10 seconds    Time  8    Period  Weeks    Status  New    Target Date  04/13/17      PT LONG TERM GOAL #3   Title  Will increase gait velocity to >/= 3.8 ft/sec    Time  8    Period  Weeks    Status  New    Target Date  04/13/17      PT LONG TERM GOAL #4   Title  Pt will increase DGI >/= 22/24    Time  8    Period  Weeks    Status  New    Target Date  04/13/17      PT LONG TERM GOAL #5   Title  Pt will demonstrate improved RLE strength with hip flexion, knee flexion and ankle DF improving by one muscle grade    Baseline  3+/5    Time  8    Period  Weeks    Status  New    Target Date  04/13/17      Additional Long Term Goals   Additional Long Term Goals  Yes      PT LONG TERM GOAL #6   Title  Will ambulate 1000' on outdoor surfaces and negotiate 24 stairs with one rail, alternating sequence MOD I with decreased foot slap and recurvatum RLE    Time  8    Period  Weeks     Status  New    Target Date  04/13/17            Plan - 04/02/17 2211    Clinical Impression Statement  Session focused on improving gait mechanics with use of Bioness lower leg unit on RLE for DF assist (esp with eccentric control) and thigh unit on hamstrings to prevent R knee hyperextension.  With increased practice and use of vibration, PT able to increase settings within session.  Pt has not yet called [employer] therefore would recommend that we take a break following next weeks' session to allow time to get brace.  Pt verblaized understanding.      Rehab Potential  Good    PT Frequency  1x / week    PT Duration  8 weeks    PT Treatment/Interventions  ADLs/Self Care Home Management;Aquatic Therapy;Electrical Stimulation;Gait training;Stair training;Functional mobility training;Therapeutic activities;Therapeutic exercise;Balance training;Neuromuscular re-education;Patient/family education;Orthotic Fit/Training;Passive range of motion;Energy conservation    PT Next Visit Plan  Take at 830!!! check goals (esp HEP) place on hold until she gets brace (leave note for front office that she will call to schedule)    Consulted and Agree with Plan of Care  Patient       Patient will benefit from skilled therapeutic intervention in order to improve the following deficits and impairments:  Abnormal gait, Decreased activity tolerance, Decreased balance, Decreased endurance, Decreased strength, Difficulty walking  Visit Diagnosis: Repeated falls  Muscle weakness (generalized)  Unsteadiness on feet  Other symptoms and signs involving the nervous system  Other abnormalities of gait and mobility     Problem List Patient Active Problem List   Diagnosis Date Noted  . Multiple sclerosis, relapsing-remitting ([place]) 02/27/2016  . Spasticity 01/31/2016  . Right leg weakness 01/31/2016    Cameron Sprang, PT, MPT [facility] [address] [place], [place], [address] Phone: [phone]   Fax:  [phone] 04/02/17, 10:16 PM  Name: Peggy Banks MRN: [id number] Date of Birth: 17-Oct-1980

## 2017-04-07 ENCOUNTER — Telehealth: Payer: Self-pay | Admitting: Neurology

## 2017-04-07 NOTE — Telephone Encounter (Signed)
Patient called and would like to speak with you regarding her FMLA paperwork. Please Call. Thanks

## 2017-04-07 NOTE — Telephone Encounter (Signed)
Spoke with patient and she will come by to drop off new FMLA  Form this week.

## 2017-04-09 ENCOUNTER — Encounter: Payer: Self-pay | Admitting: Rehabilitation

## 2017-04-09 ENCOUNTER — Ambulatory Visit: Payer: Managed Care, Other (non HMO) | Attending: Neurology | Admitting: Rehabilitation

## 2017-04-09 DIAGNOSIS — R296 Repeated falls: Secondary | ICD-10-CM | POA: Diagnosis not present

## 2017-04-09 DIAGNOSIS — R2681 Unsteadiness on feet: Secondary | ICD-10-CM | POA: Diagnosis present

## 2017-04-09 DIAGNOSIS — R2689 Other abnormalities of gait and mobility: Secondary | ICD-10-CM

## 2017-04-09 DIAGNOSIS — M6281 Muscle weakness (generalized): Secondary | ICD-10-CM | POA: Diagnosis present

## 2017-04-09 DIAGNOSIS — R29818 Other symptoms and signs involving the nervous system: Secondary | ICD-10-CM

## 2017-04-09 NOTE — Patient Instructions (Signed)
ANKLE: Dorsiflexion (Band)    Sit at edge of surface. Place band around top of foot. Keeping heel on floor, raise toes of banded foot. Hold _2-3__ seconds. Use ____red____ band. 10__ reps per set, __2_ sets per day, __5-7_ days per week  Copyright  VHI. All rights reserved.  ANKLE: Eversion, Bilateral (Band)    Place band around feet. Keeping heels on floor, raise toes of both feet up and away from body. Do not move hips. Hold _2-3__ seconds. Use __red______ band. _10__ reps per set, _2__ sets per day, _5-7__ days per week  Copyright  VHI. All rights reserved.  Prone Knee Flexion    Bend right knee, bringing heel toward buttocks. Hold _2-3___ seconds, then straighten. Can do both sides, but esp R side.  Repeat _10___ times. Do __1-2__ sessions per day.     http://gt2.exer.us/372  Copyright  VHI. All rights reserved.  Hamstring Stretch    Sitting with operated leg straight on bed, and foot of other leg on floor, lean forward toward toes of straight leg. Hold __30-45__ seconds. Repeat __2__ times. After knee bend exercise as needed.   http://gt2.exer.us/303  Copyright  VHI. All rights reserved.   Sit to Stand: Head Upright    With head upright, stand up slowly with eyes open. Try to find seat that is low enough for good challenge. We did 14" in the clinic.  Repeat __10__ times per session. Do __2__ sessions per day.  Copyright  VHI. All rights reserved.  Abduction: Clam (Eccentric) - Side-Lying    Lie on side with knees bent. Lift top knee, keeping feet together. Keep trunk steady. Slowly lower for 3-5 seconds. _10__ reps per set, _2__ sets per day, _5-7__ days per week. Add red band when you feel like 10 reps is easy.   http://ecce.exer.us/65   Copyright  VHI. All rights reserved.

## 2017-04-09 NOTE — Therapy (Signed)
[facility] [address] [place], [place], [address] Phone: [phone]   Fax:  [phone]  Physical Therapy Treatment  Patient Details  Name: Peggy Banks MRN: [id number] Date of Birth: [birth year] Referring Provider: Alda Berthold, DO   Encounter Date: 04/09/2017  PT End of Session - 04/09/17 0830    Visit Number  7    Number of Visits  9    Date for PT Re-Evaluation  04/13/17    Authorization Type  [employer] MANAGED    Authorization Time Period  60 visit limit    Authorization - Visit Number  7    Authorization - Number of Visits  60    PT Start Time  0825    PT Stop Time  0910    PT Time Calculation (min)  45 min    Activity Tolerance  Patient tolerated treatment well    Behavior During Therapy  [facility] for tasks assessed/performed       Past Medical History:  Diagnosis Date  . Hypertension   . MS (multiple sclerosis) ([place])     Past Surgical History:  Procedure Laterality Date  . CESAREAN SECTION    . EYE SURGERY     lasik bilat    There were no vitals filed for this visit.  Subjective Assessment - 04/09/17 0828    Subjective  Reports she called [employer] and they state they didn't have the new prescription, therefore provided it to patient again today and faxed it again.      Pertinent History  HTN, MS    Limitations  Walking;Standing    Patient Stated Goals  Improve her balance and leg strength    Currently in Pain?  No/denies                       [facility] Adult PT Treatment/Exercise - 04/09/17 0842      Transfers   Five time sit to stand comments   10.75 secs first rep, 9.37 secs second rep       Ambulation/Gait   Gait velocity  4.04 ft/sec without brace    Stairs  Yes    Stairs Assistance  7: Independent    Stair Management Technique  No rails;Alternating pattern;Forwards    Number of Stairs  24    Height of Stairs  6      Standardized Balance Assessment   Standardized Balance  Assessment  Dynamic Gait Index      Dynamic Gait Index   Level Surface  Normal    Change in Gait Speed  Normal    Gait with Horizontal Head Turns  Normal    Gait with Vertical Head Turns  Mild Impairment    Gait and Pivot Turn  Normal    Step Over Obstacle  Normal    Step Around Obstacles  Normal    Steps  Normal    Total Score  23        Therex:  Went over current HEP and added SL clam to replace supine clam.  See pt instruction.       PT Education - 04/09/17 0830    Education provided  Yes    Person(s) Educated  Patient    Methods  Explanation    Comprehension  Verbalized understanding       PT Short Term Goals - 02/12/17 1527      PT SHORT TERM GOAL #1   Title  Pt will demonstrate independence  with initial HEP and walking program    Time  4    Period  Weeks    Status  New    Target Date  03/14/17      PT SHORT TERM GOAL #2   Title  Decrease five time sit to stand to </= 12 seconds for increased LE strength    Baseline  14.81    Time  4    Period  Weeks    Status  New    Target Date  03/14/17      PT SHORT TERM GOAL #3   Title  Will increase gait velocity to >/= 3.6 ft/sec    Baseline  3.4 ft/sec    Time  4    Period  Weeks    Target Date  03/14/17      PT SHORT TERM GOAL #4   Title  Will improve DGI to >/= 18/24     Baseline  14/24    Time  4    Period  Weeks    Status  New    Target Date  03/14/17      PT SHORT TERM GOAL #5   Title  Will negotiate 12 stairs with one rail, alternating sequence MOD I without reports of LE quad burning    Time  4    Period  Weeks    Status  New    Target Date  03/14/17        PT Long Term Goals - 04/09/17 0831      PT LONG TERM GOAL #1   Title  Pt will be independent with final HEP and walking program    Baseline  met 04/09/17    Time  8    Period  Weeks    Status  Achieved      PT LONG TERM GOAL #2   Title  Pt will decrease five time sit to stand to </= 10 seconds    Baseline  10.75 secs without UE  support on trial one, 9.37 secs on second trial    Time  8    Period  Weeks    Status  Achieved      PT LONG TERM GOAL #3   Title  Will increase gait velocity to >/= 3.8 ft/sec    Baseline  4.04 ft/sec without brace on 04/09/17    Time  8    Period  Weeks    Status  Achieved      PT LONG TERM GOAL #4   Title  Pt will increase DGI >/= 22/24    Baseline  23/24 on 04/09/17    Time  8    Period  Weeks    Status  Achieved      PT LONG TERM GOAL #5   Title  Pt will demonstrate improved RLE strength with hip flexion, knee flexion and ankle DF improving by one muscle grade    Baseline  3+/5; hip flex 4/5 knee flex 2/5 (in prone), ankle DF 4/5    Time  8    Period  Weeks    Status  Partially Met      PT LONG TERM GOAL #6   Title  Will ambulate 1000' on outdoor surfaces and negotiate 24 stairs with one rail, alternating sequence MOD I with decreased foot slap and recurvatum RLE    Baseline  Pt able to perform 24 stairs with single rail without recurvatum however did not go outside due to weather,  will make remaining goal ongoing.     Time  8    Period  Weeks    Status  Partially Met            Plan - 04/09/17 0831    Clinical Impression Statement  Skilled session focused on assessment of LTGs. She has met 4/6 LTGs, partially meeting remaining goals.  Will plan to update these goals and add new goal to reflect gait with brace.  Placing pt on hold until she gets brace.   Pt verbalized understanding.     Rehab Potential  Good    PT Frequency  1x / week    PT Duration  8 weeks    PT Treatment/Interventions  ADLs/Self Care Home Management;Aquatic Therapy;Electrical Stimulation;Gait training;Stair training;Functional mobility training;Therapeutic activities;Therapeutic exercise;Balance training;Neuromuscular re-education;Patient/family education;Orthotic Fit/Training;Passive range of motion;Energy conservation    PT Next Visit Plan  check gait with brace, outdoor gait, assess FGA, update  goals as ongoing, add brace goal, hamstring strength goal in prone    Consulted and Agree with Plan of Care  Patient       Patient will benefit from skilled therapeutic intervention in order to improve the following deficits and impairments:  Abnormal gait, Decreased activity tolerance, Decreased balance, Decreased endurance, Decreased strength, Difficulty walking  Visit Diagnosis: Repeated falls  Muscle weakness (generalized)  Unsteadiness on feet  Other symptoms and signs involving the nervous system  Other abnormalities of gait and mobility     Problem List Patient Active Problem List   Diagnosis Date Noted  . Multiple sclerosis, relapsing-remitting ([place]) 02/27/2016  . Spasticity 01/31/2016  . Right leg weakness 01/31/2016    Cameron Sprang, PT, MPT [facility] [address] [place], [place], [address] Phone: [phone]   Fax:  [phone] 04/09/17, 9:13 AM  Name: Peggy Banks MRN: [id number] Date of Birth: 1980/11/25

## 2017-04-13 ENCOUNTER — Other Ambulatory Visit: Payer: Self-pay

## 2017-04-14 ENCOUNTER — Other Ambulatory Visit: Payer: Self-pay | Admitting: *Deleted

## 2017-04-14 DIAGNOSIS — G35 Multiple sclerosis: Secondary | ICD-10-CM

## 2017-04-21 ENCOUNTER — Encounter (HOSPITAL_COMMUNITY): Payer: Self-pay

## 2017-04-21 ENCOUNTER — Encounter (HOSPITAL_COMMUNITY)
Admission: RE | Admit: 2017-04-21 | Discharge: 2017-04-21 | Disposition: A | Discharge status: Home / Self Care | Payer: Managed Care, Other (non HMO) | Source: Ambulatory Visit | Attending: Neurology | Admitting: Neurology

## 2017-04-21 ENCOUNTER — Other Ambulatory Visit: Payer: Managed Care, Other (non HMO)

## 2017-04-21 DIAGNOSIS — G35 Multiple sclerosis: Secondary | ICD-10-CM | POA: Diagnosis not present

## 2017-04-21 DIAGNOSIS — I10 Essential (primary) hypertension: Secondary | ICD-10-CM | POA: Diagnosis not present

## 2017-04-21 MED ORDER — SODIUM CHLORIDE 0.9 % IV SOLN
INTRAVENOUS | Status: AC
  Administered 2017-04-21: 12:00:00 via INTRAVENOUS

## 2017-04-21 MED ORDER — SODIUM CHLORIDE 0.9 % IV SOLN
300.0000 mg | INTRAVENOUS | Status: DC
  Administered 2017-04-21: 300 mg via INTRAVENOUS
  Filled 2017-04-21: qty 15

## 2017-04-21 MED ORDER — LORATADINE 10 MG PO TABS
10.0000 mg | ORAL_TABLET | Freq: Once | ORAL | Status: AC
  Administered 2017-04-21: 10 mg via ORAL
  Filled 2017-04-21: qty 1

## 2017-04-21 MED ORDER — ACETAMINOPHEN 500 MG PO TABS
1000.0000 mg | ORAL_TABLET | Freq: Once | ORAL | Status: AC
  Administered 2017-04-21: 1000 mg via ORAL
  Filled 2017-04-21: qty 2

## 2017-04-21 NOTE — Discharge Instructions (Signed)

## 2017-04-22 ENCOUNTER — Telehealth: Payer: Self-pay | Admitting: *Deleted

## 2017-04-22 NOTE — Telephone Encounter (Signed)
Opened in error

## 2017-04-22 NOTE — Telephone Encounter (Signed)
I will call [facility] to check on this.

## 2017-04-22 NOTE — Telephone Encounter (Signed)
-----   Message from Alda Berthold, DO sent at 04/22/2017  1:22 PM EDT ----- F/u on jcv

## 2017-04-28 LAB — STRATIFY JCV AB (W/ INDEX) W/ RFLX
Index Value: 0.28 — ABNORMAL HIGH
STRATIFY JCV (TM) AB W/REFLEX INHIBITION: UNDETERMINED — AB

## 2017-04-28 LAB — EXTRA LAV TOP TUBE

## 2017-04-29 LAB — RFLX STRATIFY JCV (TM) AB INHIBITION: JCV Antibody by Inhibition: NEGATIVE

## 2017-04-30 ENCOUNTER — Telehealth: Payer: Self-pay | Admitting: *Deleted

## 2017-04-30 NOTE — Telephone Encounter (Signed)
Patient given results and will follow up in 6 months.

## 2017-04-30 NOTE — Telephone Encounter (Signed)
-----   Message from Alda Berthold, DO sent at 04/29/2017 12:31 PM EDT ----- Please inform pt that her JCV antibody is negative and send along with her [employer] paperwork. Recheck in 6 months. Thanks.

## 2017-05-20 ENCOUNTER — Encounter: Payer: Self-pay | Admitting: Neurology

## 2017-05-20 ENCOUNTER — Other Ambulatory Visit: Payer: Self-pay | Admitting: *Deleted

## 2017-05-20 ENCOUNTER — Other Ambulatory Visit (HOSPITAL_COMMUNITY): Payer: Self-pay | Admitting: General Practice

## 2017-05-20 ENCOUNTER — Ambulatory Visit (INDEPENDENT_AMBULATORY_CARE_PROVIDER_SITE_OTHER): Payer: Managed Care, Other (non HMO) | Admitting: Neurology

## 2017-05-20 VITALS — BP 130/84 | HR 90 | Ht 63.0 in | Wt 205.4 lb

## 2017-05-20 DIAGNOSIS — R252 Cramp and spasm: Secondary | ICD-10-CM | POA: Diagnosis not present

## 2017-05-20 DIAGNOSIS — G35 Multiple sclerosis: Secondary | ICD-10-CM

## 2017-05-20 DIAGNOSIS — Z79899 Other long term (current) drug therapy: Secondary | ICD-10-CM

## 2017-05-20 NOTE — Progress Notes (Signed)
Follow-up Visit   Date: 05/20/17    Peggy Banks MRN: [id number] DOB: [birth year]   Interim History: Peggy Banks is a 37 y.o. right-handed African American female with hypertension returning to the clinic for follow-up of multiple sclerosis.  The patient was accompanied to the clinic by self.    History of present illness: For the past 10 years, she has had a limp with her right leg and feels that is more effortful for her to move and often slaps the floor when she is walking.  Her leg also feels stiff and heavy.   There is numbness over the sole of the foot and involves the posterior lower calf.   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  Stretching sometimes helps.   During the latter half of 2017, she became more dependent on using grocery carts because it helps maintain balance when she walks.  She is able to walk unassisted, but feels more comfortable leaning on objects.  She has also noticed that right toes on the right foot do not move and her foot tends to want to curl inwards.  She recalls having right radicular pain over her thigh during her pregnancy with her son, but her current symptoms are nothing like this. She endorses low back pain.  MRI lumbar spine did not show any nerve impingement.  Upon further questioning, she was told ~2005 that there was abnormal change on her MRI for the same complaints and multiple sclerosis was mentioned, but she was never given the diagnosis.  She did not have CSF testing.  She did see neurology for this but after she became pregnant, many of her symptoms improve, and she was lost to follow-up.   In early 2018, patient underwent imaging which showed diffuse white matter changes affecting the brain as well as cervical cord, consistent with multiple sclerosis.  There are 4 small foci of enhancement. She was treated with 3 days of Solu-Medrol, but did not notice any marked change.  Since  starting baclofen, her muscle stiffness and cramps have reduced, especially in the right leg.  Unfortunately, her gait remains unchanged and is still unsteady, with dragging of the right foot. She was started Tysabri in [place] 2018 and offered AFO for her right foot weakness.    UPDATE 05/20/2017:  She is here for follow-up visit. She has no new neurological complaints and is tolerating Tysabri infusions well. Her JCV titers conference indeterminate and follow-up study was normal. She has been going to physical therapy for right leg spasticity and weakness, and continues to have benefit.She switched her AFO at [place] with the brace that supports her knee hyperextension. She has not suffered any falls. She is gradually noticing greater movement of the right foot and able to extend and roll her ankle better.  Interval MRI of the brain and cervical spine continues to show widespread white matter disease, there has been no progression over the past year.  Medications:   Current Outpatient Medications on File Prior to Visit  Medication Sig Dispense Refill  . acetaminophen (TYLENOL) 500 MG tablet Take 1,000 mg by mouth every 6 (six) hours as needed for mild pain or headache (PRN infusion).    . AMBULATORY NON FORMULARY MEDICATION 1 Units by Other route daily. Right AFO 1 Units 0  . AMBULATORY NON FORMULARY MEDICATION 1 Units by Other route daily. Right AFO 1 Units 0  . B  Complex Vitamins (VITAMIN B COMPLEX PO) Take by mouth.    . baclofen (LIORESAL) 10 MG tablet Take 1 tablet (10 mg total) by mouth 2 (two) times daily. 180 tablet 3  . cetirizine (ZYRTEC) 10 MG tablet Take 10 mg by mouth daily.    . Cholecalciferol (VITAMIN D PO) Take 5,000 Units by mouth.    . Ferrous Gluconate-C-Folic Acid (IRON-C PO) Take by mouth.    Marland Kitchen lisinopril-hydrochlorothiazide (PRINZIDE,ZESTORETIC) 10-12.5 MG tablet Take 1 tablet by mouth daily.    . Multiple Vitamin (MULTIVITAMIN) tablet Take 1 tablet by mouth daily.    .  natalizumab (TYSABRI) 300 MG/15ML injection Inject into the vein every 28 (twenty-eight) days.    . Omega-3 Fatty Acids (FISH OIL) 1360 MG CAPS Take 2 capsules by mouth daily.    . rosuvastatin (CRESTOR) 10 MG tablet Take 10 mg by mouth daily.     No current facility-administered medications on file prior to visit.     Allergies:  Allergies  Allergen Reactions  . Cefdinir Swelling    Review of Systems:  CONSTITUTIONAL: No fevers, chills, night sweats, or weight loss.  EYES: No visual changes or eye pain ENT: No hearing changes.  No history of nose bleeds.   RESPIRATORY: No cough, wheezing and shortness of breath.   CARDIOVASCULAR: Negative for chest pain, and palpitations.   GI: Negative for abdominal discomfort, blood in stools or black stools.  No recent change in bowel habits.   GU:  No history of incontinence.   MUSCLOSKELETAL: No history of joint pain or swelling.  No myalgias.   SKIN: Negative for lesions, rash, and itching.   ENDOCRINE: Negative for cold or heat intolerance, polydipsia or goiter.   PSYCH:  No depression or anxiety symptoms.   NEURO: As Above.   Vital Signs:  BP 130/84   Pulse 90   Ht 5\' 3"  (1.6 m)   Wt 205 lb 6 oz (93.2 kg)   SpO2 97%   BMI 36.38 kg/m  General Medical Exam:   General:  Well appearing, comfortable  Neck:  No carotid bruits. Respiratory:  Clear to auscultation, good air entry bilaterally.   Cardiac:  Regular rate and rhythm, no murmur.   Ext  Right knee hyperextension  Neurological Exam: MENTAL STATUS including orientation to time, place, person, recent and remote memory, attention span and concentration, language, and fund of knowledge is normal.  Speech is not dysarthric.  CRANIAL NERVES:  Normal fundoscopic exam.  Pupils round and reactive to light.  Extraocular muscles intact.  Face is symmetric.  MOTOR:  Motor strength is 5/5 in all extremities, except right foot dorsiflexion 5-/5, eversion 4+/5, inversion 4/5, toe extension  and flexion 2+/5.  There mild RLE spasticity in the legs.  MSRs:  Right                                                                 Left brachioradialis 3+  brachioradialis 2+  biceps 3+  biceps 2+  triceps 3+  triceps 2+  patellar 3+  patellar 3+  ankle jerk 3+  ankle jerk 2+   SENSORY: Vibration is absent at the toes bilaterally  COORDINATION/GAIT:  Gait appears stable today with reduced R knee hyperextension, mild dragging of the right foot  Data: MRI brain personally reviewed dated 02/23/2006 which shows extensive scatter T2 hyperintensities in the subcortical and periventricular regions, with a larger confluent area on the left frontal horn and right posterior horn of the lateral ventricle.  There is also involvement of the corpus callosum. There is small foci of enhancement involving the subcortical parito-occiptal region.  MRI brain and cervical spine with and without contrast 02/07/2016:  Findings consistent with multiple sclerosis. Disease is extensive throughout the brain and cervical cord and there is white matter atrophy. Four foci of enhancement/active demyelination are noted.  MRI thoracic spine wwo contrast 03/10/2016:  1. Widespread severe thoracic spinal cord signal abnormality compatible with chronic demyelinating disease. No areas of acute thoracic cord demyelination are identified. 2. Isolated small right paracentral thoracic disc herniation at T10-T11. Borderline to mild associated spinal stenosis at that level.  MRI brain and cervical spine wwo contrast 03/07/2017: Extensive cerebral white matter changes involving the supratentorial and infratentorial brain, consistent with history of multiple sclerosis, overall similar appearance and not significantly progressed relative to most recent MRI from 02/07/2016. Single punctate nodular focus of enhancement at the parasagittal high right frontal lobe, compatible with active demyelination.  MRI CERVICAL SPINE IMPRESSION: 1.  Extensive patchy cord signal abnormality throughout the cervical spinal cord, consistent with demyelinating disease, overall similar relative to previous exam. No evidence for active demyelination. 2. Small central disc protrusions at C4-5 and C6-7 without stenosis.  IMPRESSION/PLAN: Relpasing remitting multiple sclerosis with symptom manifestation in 2005, diagnosed in 2018.  Imaging confirmed multiple sclerosis with high disease burden involving juxtacortical, periventricular, brainstem, and cerebellum as well as short segment demyelinating changes affecting nearly every level of the cervical and thoracic spine.    Her exam continues to show mild right knee hyperextension and foot weakness. There is improved right dorsiflexion and eversion as compared to before.   Her spasticity has improved with baclofen 10mg  BID.   She was started on Tysabri in April 2018 and has been tolerating this well, which will be continued.  JCV titers are negative.  She had many questions regarding duration of therapy for Tysabri.  We discussed that risk of JCV increased with treatment beyond 2 years.  At that time, we can mutually decide whether to continue Tysabri and monitor JCV every 3 months or transition to oral disease-modifying therapy with less risk of JCV.   PLAN: 1.  Continue monthly Tysabri infusions 2.  Continue baclofen 10mg  twice daily  3.  Check CBC and CMP in 3 months 4.  Check JVC in October 5.  Continue physical therapy for left knee hyperextension 6.  Continue vitamin D 5000 units  Return to clinic in 5 months  Greater than 50% of this 30 minute visit was spent in counseling, explanation of diagnosis, planning of further management, and coordination of care.    Thank you for allowing me to participate in patient's care.  If I can answer any additional questions, I would be pleased to do so.    Sincerely,    Harshan Kearley K. Posey Pronto, DO

## 2017-05-20 NOTE — Patient Instructions (Addendum)
Check CBC and CMP in 3 months Check JCV in October  Continue baclofen 10mg  twice daily  Return to clinic in October

## 2017-05-21 ENCOUNTER — Other Ambulatory Visit (HOSPITAL_COMMUNITY): Payer: Self-pay | Admitting: General Practice

## 2017-05-25 ENCOUNTER — Telehealth: Payer: Self-pay | Admitting: Neurology

## 2017-05-25 NOTE — Telephone Encounter (Signed)
Pt called and has a question about her FMLA paperwork, please call

## 2017-05-25 NOTE — Telephone Encounter (Signed)
Patient is coming in tomorrow with paperwork for me to fill out.

## 2017-05-26 ENCOUNTER — Encounter (HOSPITAL_COMMUNITY): Payer: Self-pay

## 2017-05-26 ENCOUNTER — Encounter (HOSPITAL_COMMUNITY)
Admission: RE | Admit: 2017-05-26 | Discharge: 2017-05-26 | Disposition: A | Discharge status: Home / Self Care | Payer: Managed Care, Other (non HMO) | Source: Ambulatory Visit | Attending: Neurology | Admitting: Neurology

## 2017-05-26 DIAGNOSIS — I10 Essential (primary) hypertension: Secondary | ICD-10-CM | POA: Diagnosis not present

## 2017-05-26 DIAGNOSIS — G35 Multiple sclerosis: Secondary | ICD-10-CM | POA: Insufficient documentation

## 2017-05-26 MED ORDER — ACETAMINOPHEN 500 MG PO TABS
1000.0000 mg | ORAL_TABLET | Freq: Once | ORAL | Status: AC
  Administered 2017-05-26: 1000 mg via ORAL
  Filled 2017-05-26: qty 2

## 2017-05-26 MED ORDER — NATALIZUMAB 300 MG/15ML IV CONC
300.0000 mg | INTRAVENOUS | Status: DC
  Administered 2017-05-26: 300 mg via INTRAVENOUS
  Filled 2017-05-26: qty 15

## 2017-05-26 MED ORDER — LORATADINE 10 MG PO TABS
10.0000 mg | ORAL_TABLET | Freq: Once | ORAL | Status: AC
  Administered 2017-05-26: 10 mg via ORAL
  Filled 2017-05-26: qty 1

## 2017-05-26 MED ORDER — SODIUM CHLORIDE 0.9 % IV SOLN
INTRAVENOUS | Status: DC
  Administered 2017-05-26: 09:00:00 via INTRAVENOUS

## 2017-05-26 NOTE — Progress Notes (Signed)
Patient was only able to stay 35 out of 60 minute recommended observation time post Tysabri infusion. Tolerated infusion well. Has appointments for next infusion.

## 2017-05-26 NOTE — Discharge Instructions (Signed)
Emergencies call [phone]. Call MD for problems or questions.     tysbari Natalizumab injection What is this medicine? NATALIZUMAB (na ta LIZ you mab) is used to treat relapsing multiple sclerosis. This drug is not a cure. It is also used to treat Crohn's disease. This medicine may be used for other purposes; ask your health care provider or pharmacist if you have questions. COMMON BRAND NAME(S): Tysabri What should I tell my health care provider before I take this medicine? They need to know if you have any of these conditions: -immune system problems -progressive multifocal leukoencephalopathy (PML) -an unusual or allergic reaction to natalizumab, other medicines, foods, dyes, or preservatives -pregnant or trying to get pregnant -breast-feeding How should I use this medicine? This medicine is for infusion into a vein. It is given by a health care professional in a hospital or clinic setting. A special MedGuide will be given to you by the pharmacist with each prescription and refill. Be sure to read this information carefully each time. Talk to your pediatrician regarding the use of this medicine in children. This medicine is not approved for use in children. Overdosage: If you think you have taken too much of this medicine contact a poison control center or emergency room at once. NOTE: This medicine is only for you. Do not share this medicine with others. What if I miss a dose? It is important not to miss your dose. Call your doctor or health care professional if you are unable to keep an appointment. What may interact with this medicine? -azathioprine -cyclosporine -interferon -6-mercaptopurine -methotrexate -steroid medicines like prednisone or cortisone -TNF-alpha inhibitors like adalimumab, etanercept, and infliximab -vaccines This list may not describe all possible interactions. Give your health care provider a list of all the medicines, herbs, non-prescription drugs, or dietary  supplements you use. Also tell them if you smoke, drink alcohol, or use illegal drugs. Some items may interact with your medicine. What should I watch for while using this medicine? Your condition will be monitored carefully while you are receiving this medicine. Visit your doctor for regular check ups. Tell your doctor or healthcare professional if your symptoms do not start to get better or if they get worse. Stay away from people who are sick. Call your doctor or health care professional for advice if you get a fever, chills or sore throat, or other symptoms of a cold or flu. Do not treat yourself. In some patients, this medicine may cause a serious brain infection that may cause death. If you have any problems seeing, thinking, speaking, walking, or standing, tell your doctor right away. If you cannot reach your doctor, get urgent medical care. What side effects may I notice from receiving this medicine? Side effects that you should report to your doctor or health care professional as soon as possible: -allergic reactions like skin rash, itching or hives, swelling of the face, lips, or tongue -breathing problems -changes in vision -chest pain -dark urine -depression, feelings of sadness -dizziness -general ill feeling or flu-like symptoms -irregular, missed, or painful menstrual periods -light-colored stools -loss of appetite, nausea -muscle weakness -problems with balance, talking, or walking -right upper belly pain -unusually weak or tired -yellowing of the eyes or skin Side effects that usually do not require medical attention (report to your doctor or health care professional if they continue or are bothersome): -aches, pains -headache -stomach upset -tiredness This list may not describe all possible side effects. Call your doctor for medical advice about side  effects. You may report side effects to FDA at 1-800-FDA-1088. Where should I keep my medicine? This drug is given in a  hospital or clinic and will not be stored at home. NOTE: This sheet is a summary. It may not cover all possible information. If you have questions about this medicine, talk to your doctor, pharmacist, or health care provider.  2018 Elsevier/Gold Standard (2008-02-11 13:33:21)

## 2017-06-29 ENCOUNTER — Encounter (HOSPITAL_COMMUNITY): Payer: Self-pay

## 2017-06-29 ENCOUNTER — Encounter (HOSPITAL_COMMUNITY)
Admission: RE | Admit: 2017-06-29 | Discharge: 2017-06-29 | Disposition: A | Discharge status: Home / Self Care | Payer: Managed Care, Other (non HMO) | Source: Ambulatory Visit | Attending: Neurology | Admitting: Neurology

## 2017-06-29 DIAGNOSIS — G35 Multiple sclerosis: Secondary | ICD-10-CM | POA: Diagnosis not present

## 2017-06-29 DIAGNOSIS — I10 Essential (primary) hypertension: Secondary | ICD-10-CM | POA: Insufficient documentation

## 2017-06-29 MED ORDER — SODIUM CHLORIDE 0.9 % IV SOLN
INTRAVENOUS | Status: DC
  Administered 2017-06-29: 12:00:00 via INTRAVENOUS

## 2017-06-29 MED ORDER — ACETAMINOPHEN 500 MG PO TABS
1000.0000 mg | ORAL_TABLET | Freq: Once | ORAL | Status: AC
  Administered 2017-06-29: 1000 mg via ORAL
  Filled 2017-06-29: qty 2

## 2017-06-29 MED ORDER — NATALIZUMAB 300 MG/15ML IV CONC
300.0000 mg | INTRAVENOUS | Status: DC
  Administered 2017-06-29: 300 mg via INTRAVENOUS
  Filled 2017-06-29: qty 15

## 2017-06-29 MED ORDER — LORATADINE 10 MG PO TABS
10.0000 mg | ORAL_TABLET | Freq: Once | ORAL | Status: AC
  Administered 2017-06-29: 10 mg via ORAL
  Filled 2017-06-29: qty 1

## 2017-06-29 NOTE — Progress Notes (Signed)
Tolerated infusion, patient did not stay Touch recommendation 1 hour post infusion.

## 2017-07-12 ENCOUNTER — Encounter: Payer: Self-pay | Admitting: Rehabilitation

## 2017-07-12 NOTE — Therapy (Signed)
[facility] [address] [place], [place], [address] Phone: [phone]   Fax:  [phone]  Patient Details  Name: Peggy Banks MRN: [id number] Date of Birth: [birth year] Referring Provider:  No ref. provider found  Encounter Date: 07/12/2017   PHYSICAL THERAPY DISCHARGE SUMMARY  Visits from Start of Care: 7  Current functional level related to goals / functional outcomes: PT Long Term Goals - 04/09/17 0831      PT LONG TERM GOAL #1   Title  Pt will be independent with final HEP and walking program    Baseline  met 04/09/17    Time  8    Period  Weeks    Status  Achieved      PT LONG TERM GOAL #2   Title  Pt will decrease five time sit to stand to </= 10 seconds    Baseline  10.75 secs without UE support on trial one, 9.37 secs on second trial    Time  8    Period  Weeks    Status  Achieved      PT LONG TERM GOAL #3   Title  Will increase gait velocity to >/= 3.8 ft/sec    Baseline  4.04 ft/sec without brace on 04/09/17    Time  8    Period  Weeks    Status  Achieved      PT LONG TERM GOAL #4   Title  Pt will increase DGI >/= 22/24    Baseline  23/24 on 04/09/17    Time  8    Period  Weeks    Status  Achieved      PT LONG TERM GOAL #5   Title  Pt will demonstrate improved RLE strength with hip flexion, knee flexion and ankle DF improving by one muscle grade    Baseline  3+/5; hip flex 4/5 knee flex 2/5 (in prone), ankle DF 4/5    Time  8    Period  Weeks    Status  Partially Met      PT LONG TERM GOAL #6   Title  Will ambulate 1000' on outdoor surfaces and negotiate 24 stairs with one rail, alternating sequence MOD I with decreased foot slap and recurvatum RLE    Baseline  Pt able to perform 24 stairs with single rail without recurvatum however did not go outside due to weather, will make remaining goal ongoing.     Time  8    Period  Weeks    Status  Partially Met         Remaining deficits: Unsure  as she has not returned.  Pt was placed on hold in order to get AFO issues resolved and get new brace to address current deficits.  Will D/C at this time.    Education / Equipment: HEP  Plan: Patient agrees to discharge.  Patient goals were not met. Patient is being discharged due to not returning since the last visit.  ?????        Cameron Sprang, PT, MPT [facility] [address] [place], [place], [address] Phone: [phone]   Fax:  [phone] 07/12/17, 1:49 PM

## 2017-07-27 ENCOUNTER — Encounter (HOSPITAL_COMMUNITY)
Admission: RE | Admit: 2017-07-27 | Discharge: 2017-07-27 | Disposition: A | Discharge status: Home / Self Care | Payer: Managed Care, Other (non HMO) | Source: Ambulatory Visit | Attending: Neurology | Admitting: Neurology

## 2017-07-27 ENCOUNTER — Other Ambulatory Visit: Payer: Self-pay | Admitting: *Deleted

## 2017-07-27 DIAGNOSIS — G35 Multiple sclerosis: Secondary | ICD-10-CM

## 2017-07-27 DIAGNOSIS — I10 Essential (primary) hypertension: Secondary | ICD-10-CM | POA: Diagnosis not present

## 2017-07-27 MED ORDER — LORATADINE 10 MG PO TABS
10.0000 mg | ORAL_TABLET | Freq: Once | ORAL | Status: AC
  Administered 2017-07-27: 10 mg via ORAL
  Filled 2017-07-27: qty 1

## 2017-07-27 MED ORDER — ACETAMINOPHEN 500 MG PO TABS
1000.0000 mg | ORAL_TABLET | Freq: Once | ORAL | Status: AC
  Administered 2017-07-27: 1000 mg via ORAL
  Filled 2017-07-27: qty 2

## 2017-07-27 MED ORDER — SODIUM CHLORIDE 0.9 % IV SOLN
INTRAVENOUS | Status: DC
  Administered 2017-07-27: 10:00:00 via INTRAVENOUS

## 2017-07-27 MED ORDER — SODIUM CHLORIDE 0.9 % IV SOLN
300.0000 mg | INTRAVENOUS | Status: DC
  Administered 2017-07-27: 300 mg via INTRAVENOUS
  Filled 2017-07-27: qty 15

## 2017-08-24 ENCOUNTER — Encounter (HOSPITAL_COMMUNITY)
Admission: RE | Admit: 2017-08-24 | Discharge: 2017-08-24 | Disposition: A | Discharge status: Home / Self Care | Payer: Managed Care, Other (non HMO) | Source: Ambulatory Visit | Attending: Neurology | Admitting: Neurology

## 2017-08-24 ENCOUNTER — Encounter (HOSPITAL_COMMUNITY): Payer: Self-pay

## 2017-08-24 DIAGNOSIS — I10 Essential (primary) hypertension: Secondary | ICD-10-CM | POA: Insufficient documentation

## 2017-08-24 DIAGNOSIS — G35 Multiple sclerosis: Secondary | ICD-10-CM

## 2017-08-24 MED ORDER — ACETAMINOPHEN 500 MG PO TABS
1000.0000 mg | ORAL_TABLET | Freq: Once | ORAL | Status: AC
  Administered 2017-08-24: 1000 mg via ORAL
  Filled 2017-08-24: qty 2

## 2017-08-24 MED ORDER — SODIUM CHLORIDE 0.9 % IV SOLN
INTRAVENOUS | Status: DC
  Administered 2017-08-24: 11:00:00 via INTRAVENOUS

## 2017-08-24 MED ORDER — LORATADINE 10 MG PO TABS
10.0000 mg | ORAL_TABLET | Freq: Once | ORAL | Status: AC
  Administered 2017-08-24: 10 mg via ORAL
  Filled 2017-08-24: qty 1

## 2017-08-24 MED ORDER — SODIUM CHLORIDE 0.9 % IV SOLN
300.0000 mg | INTRAVENOUS | Status: DC
  Administered 2017-08-24: 300 mg via INTRAVENOUS
  Filled 2017-08-24: qty 15

## 2017-08-24 NOTE — Discharge Instructions (Signed)

## 2017-09-28 ENCOUNTER — Telehealth: Payer: Self-pay | Admitting: Neurology

## 2017-09-28 ENCOUNTER — Other Ambulatory Visit: Payer: Self-pay

## 2017-09-28 ENCOUNTER — Other Ambulatory Visit (HOSPITAL_COMMUNITY): Payer: Self-pay | Admitting: General Practice

## 2017-09-28 ENCOUNTER — Encounter (HOSPITAL_COMMUNITY)
Admission: RE | Admit: 2017-09-28 | Discharge: 2017-09-28 | Disposition: A | Discharge status: Home / Self Care | Payer: Managed Care, Other (non HMO) | Source: Ambulatory Visit | Attending: Neurology | Admitting: Neurology

## 2017-09-28 DIAGNOSIS — G35 Multiple sclerosis: Secondary | ICD-10-CM | POA: Diagnosis present

## 2017-09-28 DIAGNOSIS — I10 Essential (primary) hypertension: Secondary | ICD-10-CM | POA: Diagnosis not present

## 2017-09-28 MED ORDER — SODIUM CHLORIDE 0.9 % IV SOLN
INTRAVENOUS | Status: AC
  Administered 2017-09-28: 09:00:00 via INTRAVENOUS

## 2017-09-28 MED ORDER — LORATADINE 10 MG PO TABS
10.0000 mg | ORAL_TABLET | ORAL | Status: AC
  Administered 2017-09-28: 10 mg via ORAL
  Filled 2017-09-28: qty 1

## 2017-09-28 MED ORDER — SODIUM CHLORIDE 0.9 % IV SOLN
300.0000 mg | INTRAVENOUS | Status: AC
  Administered 2017-09-28: 300 mg via INTRAVENOUS
  Filled 2017-09-28: qty 15

## 2017-09-28 MED ORDER — ACETAMINOPHEN 500 MG PO TABS
1000.0000 mg | ORAL_TABLET | ORAL | Status: AC
  Administered 2017-09-28: 1000 mg via ORAL
  Filled 2017-09-28: qty 2

## 2017-09-28 NOTE — Telephone Encounter (Signed)
Patient is calling in stating that her FMLA paperwork was incomplete and she was trying to figure out what she needed to do. Please call her back at [phone]. Thanks!

## 2017-09-29 ENCOUNTER — Other Ambulatory Visit: Payer: Self-pay | Admitting: *Deleted

## 2017-09-29 DIAGNOSIS — Z79899 Other long term (current) drug therapy: Secondary | ICD-10-CM

## 2017-09-29 DIAGNOSIS — G35 Multiple sclerosis: Secondary | ICD-10-CM

## 2017-09-29 NOTE — Telephone Encounter (Signed)
Patient notified that she is due for lab work and she will drop off paperwork when she comes in.

## 2017-09-30 ENCOUNTER — Other Ambulatory Visit (INDEPENDENT_AMBULATORY_CARE_PROVIDER_SITE_OTHER): Payer: Managed Care, Other (non HMO)

## 2017-09-30 DIAGNOSIS — Z79899 Other long term (current) drug therapy: Secondary | ICD-10-CM

## 2017-09-30 DIAGNOSIS — G35 Multiple sclerosis: Secondary | ICD-10-CM | POA: Diagnosis not present

## 2017-09-30 LAB — CBC WITH DIFFERENTIAL/PLATELET
Basophils Absolute: 0 K/uL (ref 0.0–0.1)
Basophils Relative: 0.3 % (ref 0.0–3.0)
Eosinophils Absolute: 0.1 K/uL (ref 0.0–0.7)
Eosinophils Relative: 1 % (ref 0.0–5.0)
HCT: 32.2 % — ABNORMAL LOW (ref 36.0–46.0)
Hemoglobin: 10.8 g/dL — ABNORMAL LOW (ref 12.0–15.0)
Lymphocytes Relative: 48 % — ABNORMAL HIGH (ref 12.0–46.0)
Lymphs Abs: 2.6 K/uL (ref 0.7–4.0)
MCHC: 33.7 g/dL (ref 30.0–36.0)
MCV: 81.9 fl (ref 78.0–100.0)
Monocytes Absolute: 0.5 K/uL (ref 0.1–1.0)
Monocytes Relative: 9.6 % (ref 3.0–12.0)
Neutro Abs: 2.2 K/uL (ref 1.4–7.7)
Neutrophils Relative %: 41.1 % — ABNORMAL LOW (ref 43.0–77.0)
Platelets: 306 K/uL (ref 150.0–400.0)
RBC: 3.93 Mil/uL (ref 3.87–5.11)
RDW: 15.1 % (ref 11.5–15.5)
WBC: 5.4 K/uL (ref 4.0–10.5)

## 2017-09-30 LAB — COMPREHENSIVE METABOLIC PANEL
ALT: 13 U/L (ref 0–35)
AST: 16 U/L (ref 0–37)
Albumin: 4 g/dL (ref 3.5–5.2)
Alkaline Phosphatase: 39 U/L (ref 39–117)
BUN: 8 mg/dL (ref 6–23)
CO2: 28 meq/L (ref 19–32)
Calcium: 9.2 mg/dL (ref 8.4–10.5)
Chloride: 102 mEq/L (ref 96–112)
Creatinine, Ser: 0.67 mg/dL (ref 0.40–1.20)
GFR: 127.45 mL/min (ref >60.00)
GLUCOSE: 99 mg/dL (ref 70–99)
POTASSIUM: 4 meq/L (ref 3.5–5.1)
SODIUM: 137 meq/L (ref 135–145)
Total Bilirubin: 0.3 mg/dL (ref 0.2–1.2)
Total Protein: 7.3 g/dL (ref 6.0–8.3)

## 2017-10-07 LAB — RFLX STRATIFY JCV (TM) AB INHIBITION: JCV Antibody by Inhibition: NEGATIVE

## 2017-10-07 LAB — STRATIFY JCV AB (W/ INDEX) W/ RFLX
Index Value: 0.39 — ABNORMAL HIGH
Stratify JCV (TM) Ab w/Reflex Inhibition: UNDETERMINED — AB

## 2017-10-08 ENCOUNTER — Telehealth: Payer: Self-pay | Admitting: Neurology

## 2017-10-08 NOTE — Telephone Encounter (Signed)
-----   Message from Pieter Partridge, DO sent at 10/08/2017  7:14 AM EDT ----- JC Virus antibody remains negative

## 2017-10-08 NOTE — Telephone Encounter (Signed)
Mychart message sent to patient.

## 2017-10-13 ENCOUNTER — Ambulatory Visit (HOSPITAL_COMMUNITY)
Admission: EM | Admit: 2017-10-13 | Discharge: 2017-10-13 | Disposition: A | Discharge status: Home / Self Care | Payer: Managed Care, Other (non HMO) | Attending: Family Medicine | Admitting: Family Medicine

## 2017-10-13 ENCOUNTER — Encounter (HOSPITAL_COMMUNITY): Payer: Self-pay | Admitting: Emergency Medicine

## 2017-10-13 DIAGNOSIS — M791 Myalgia, unspecified site: Secondary | ICD-10-CM

## 2017-10-13 DIAGNOSIS — R5383 Other fatigue: Secondary | ICD-10-CM | POA: Diagnosis not present

## 2017-10-13 DIAGNOSIS — B349 Viral infection, unspecified: Secondary | ICD-10-CM | POA: Diagnosis not present

## 2017-10-13 DIAGNOSIS — R52 Pain, unspecified: Secondary | ICD-10-CM

## 2017-10-13 NOTE — Discharge Instructions (Signed)
Use anti-inflammatories for body aches/ cramping. You may take up to 800 mg Ibuprofen every 8 hours with food. You may supplement Ibuprofen with Tylenol [phone] mg every 8 hours.   Drink plenty of fluids  Follow up if developing new symptoms, symptoms worsening, not improving or changing

## 2017-10-13 NOTE — ED Triage Notes (Signed)
Pt c/o chills, body aches, feeling tired since this morning.

## 2017-10-14 NOTE — ED Provider Notes (Signed)
[place]    CSN: [id number] Arrival date & time: 10/13/17  1321     History   Chief Complaint Chief Complaint  Patient presents with  . Generalized Body Aches    HPI Peggy Banks is a 37 y.o. female history of hypertension, MS presenting today for evaluation of body aches, chills and fatigue.  Patient states that she woke up this morning and felt significantly more fatigued than normal.  Notes that she has had congestion for the past week, but this has relatively resolved.  She has had some slight lower abdominal pressure, and associated nausea, but denies vomiting.  Eating and drinking like normal.  Denies diarrhea or change in bowel movements.  Her LMP's started yesterday.  Denies typically feeling this way with her menstrual cycles.  Denies fevers.  She has not taken any medicines for this.  Denies chest pain or shortness of breath.  Denies cough and sore throat.  HPI  Past Medical History:  Diagnosis Date  . Hypertension   . MS (multiple sclerosis) [facility])     Patient Active Problem List   Diagnosis Date Noted  . Multiple sclerosis, relapsing-remitting ([place]) 02/27/2016  . Spasticity 01/31/2016  . Right leg weakness 01/31/2016    Past Surgical History:  Procedure Laterality Date  . CESAREAN SECTION    . EYE SURGERY     lasik bilat    OB History   None      Home Medications    Prior to Admission medications   Medication Sig Start Date End Date Taking? Authorizing Provider  acetaminophen (TYLENOL) 500 MG tablet Take 1,000 mg by mouth every 6 (six) hours as needed for mild pain or headache (PRN infusion).    [provider]  AMBULATORY NON FORMULARY MEDICATION 1 Units by Other route daily. Right AFO 03/27/16   Patel, Arvin Collard K, DO  AMBULATORY NON FORMULARY MEDICATION 1 Units by Other route daily. Right AFO 03/25/17   Narda Amber K, DO  B Complex Vitamins (VITAMIN B COMPLEX PO) Take by mouth.    [provider]  baclofen (LIORESAL) 10  MG tablet Take 1 tablet (10 mg total) by mouth 2 (two) times daily. 02/12/17   Narda Amber K, DO  cetirizine (ZYRTEC) 10 MG tablet Take 10 mg by mouth daily.    [provider]  Cholecalciferol (VITAMIN D PO) Take 5,000 Units by mouth.    [provider]  Ferrous Gluconate-C-Folic Acid (IRON-C PO) Take by mouth.    [provider]  lisinopril-hydrochlorothiazide (PRINZIDE,ZESTORETIC) 10-12.5 MG tablet Take 1 tablet by mouth daily.    [provider]  Multiple Vitamin (MULTIVITAMIN) tablet Take 1 tablet by mouth daily.    [provider]  natalizumab (TYSABRI) 300 MG/15ML injection Inject into the vein every 28 (twenty-eight) days.    [provider]  Omega-3 Fatty Acids (FISH OIL) 1360 MG CAPS Take 2 capsules by mouth daily.    [provider]  rosuvastatin (CRESTOR) 10 MG tablet Take 10 mg by mouth daily.    [provider]    Family History Family History  Problem Relation Age of Onset  . Hypothyroidism Mother   . Hypertension Mother   . Hypertension Father   . Healthy Sister   . Cancer Maternal Grandfather   . Stroke Paternal Grandmother   . Other Paternal Grandfather        MVA    Social History Social History   Tobacco Use  . Smoking status: Never  Smoker  . Smokeless tobacco: Never Used  Substance Use Topics  . Alcohol use: No    Comment: Occasional  . Drug use: No     Allergies   Cefdinir   Review of Systems Review of Systems  Constitutional: Positive for chills and fatigue. Negative for activity change, appetite change and fever.  HENT: Positive for congestion. Negative for ear pain, rhinorrhea, sinus pressure, sore throat and trouble swallowing.   Eyes: Negative for discharge and redness.  Respiratory: Negative for cough, chest tightness and shortness of breath.   Cardiovascular: Negative for chest pain.  Gastrointestinal: Positive for abdominal pain. Negative for diarrhea, nausea and  vomiting.  Genitourinary: Positive for vaginal bleeding. Negative for dysuria and vaginal discharge.  Musculoskeletal: Positive for myalgias.  Skin: Negative for rash.  Neurological: Negative for dizziness, light-headedness and headaches.     Physical Exam Triage Vital Signs ED Triage Vitals  Enc Vitals Group     BP 10/13/17 1403 (!) 170/95     Pulse Rate 10/13/17 1403 (!) 118     Resp 10/13/17 1403 20     Temp 10/13/17 1403 98.9 F (37.2 C)     Temp Source 10/13/17 1403 Oral     SpO2 10/13/17 1403 100 %     Weight --      Height --      Head Circumference --      Peak Flow --      Pain Score 10/13/17 1425 7     Pain Loc --      Pain Edu? --      Excl. in [place]? --    No data found.  Updated Vital Signs BP 123/79 (BP Location: Left Arm)   Pulse (!) 105   Temp 98.9 F (37.2 C) (Oral)   Resp 20   LMP 10/12/2017   SpO2 100%  Blood pressure rechecked from original reading with a different size cuff and was 123/79.  Heart rate improved from 118 at initial reading to 105. Visual Acuity Right Eye Distance:   Left Eye Distance:   Bilateral Distance:    Right Eye Near:   Left Eye Near:    Bilateral Near:     Physical Exam  Constitutional: She appears well-developed and well-nourished. No distress.  HENT:  Head: Normocephalic and atraumatic.  Bilateral ears without tenderness to palpation of external auricle, tragus and mastoid, EAC's without erythema or swelling, TM's with good bony landmarks and cone of light. Non erythematous.  Oral mucosa pink and moist, no tonsillar enlargement or exudate. Posterior pharynx patent and nonerythematous, no uvula deviation or swelling. Normal phonation.  Eyes: Conjunctivae are normal.  Neck: Neck supple.  Full active range of motion of neck, negative Kernig and Brudzinski  Cardiovascular: Regular rhythm.  No murmur heard. Tachycardia  Pulmonary/Chest: Effort normal and breath sounds normal. No respiratory distress.  Abdominal: Soft.  There is tenderness.  Abdomen soft, nondistended, no guarding during exam, mild tenderness to palpation of bilateral lower quadrants, negative rebound, negative McBurney's, and negative Rovsing.  Musculoskeletal: She exhibits no edema.  No leg pain or leg swelling, negative Homans bilaterally  Neurological: She is alert.  Skin: Skin is warm and dry.  Psychiatric: She has a normal mood and affect.  Nursing note and vitals reviewed.    UC Treatments / Results  Labs (all labs ordered are listed, but only abnormal results are displayed) Labs Reviewed - No data to display  EKG None  Radiology No results found.  Procedures  Procedures (including critical care time)  Medications Ordered in UC Medications - No data to display  Initial Impression / Assessment and Plan / UC Course  I have reviewed the triage vital signs and the nursing notes.  Pertinent labs & imaging results that were available during my care of the patient were reviewed by me and considered in my medical decision making (see chart for details).    Patient with mild tachycardia, fatigue and body aches.  Possible viral illness versus related to menstrual cycle.  Only one day of this, will recommend patient to use anti-inflammatories for body aches, push fluids, continue to monitor symptoms, follow-up if symptoms worsening, changing or not improving.  No acute signs of source of infection at this time for acute abnormality.  Abdomen mildly tender, but negative peritoneal signs.  Discussed with patient monitoring as her symptoms since have been going on 1 day and follow-up if symptoms worsening or changing.  Minimal URI symptoms to attribute to sinusitis.Discussed strict return precautions. Patient verbalized understanding and is agreeable with plan.   Final Clinical Impressions(s) / UC Diagnoses   Final diagnoses:  Viral illness  Body aches  Fatigue, unspecified type     Discharge Instructions     Use  anti-inflammatories for body aches/ cramping. You may take up to 800 mg Ibuprofen every 8 hours with food. You may supplement Ibuprofen with Tylenol [phone] mg every 8 hours.   Drink plenty of fluids  Follow up if developing new symptoms, symptoms worsening, not improving or changing    ED Prescriptions    None     Controlled Substance Prescriptions [place] Controlled Substance Registry consulted? Not Applicable   Janith Lima, [place] 10/14/17 0375

## 2017-10-26 ENCOUNTER — Encounter (HOSPITAL_COMMUNITY)
Admission: RE | Admit: 2017-10-26 | Discharge: 2017-10-26 | Disposition: A | Discharge status: Home / Self Care | Payer: Managed Care, Other (non HMO) | Source: Ambulatory Visit | Attending: Neurology | Admitting: Neurology

## 2017-10-26 ENCOUNTER — Encounter (HOSPITAL_COMMUNITY): Payer: Self-pay

## 2017-10-26 DIAGNOSIS — I10 Essential (primary) hypertension: Secondary | ICD-10-CM | POA: Insufficient documentation

## 2017-10-26 DIAGNOSIS — G35 Multiple sclerosis: Secondary | ICD-10-CM | POA: Insufficient documentation

## 2017-10-26 MED ORDER — SODIUM CHLORIDE 0.9 % IV SOLN
300.0000 mg | INTRAVENOUS | Status: DC
  Administered 2017-10-26: 300 mg via INTRAVENOUS
  Filled 2017-10-26: qty 15

## 2017-10-26 MED ORDER — SODIUM CHLORIDE 0.9 % IV SOLN
Freq: Once | INTRAVENOUS | Status: AC
  Administered 2017-10-26: 10:00:00 via INTRAVENOUS

## 2017-10-26 MED ORDER — LORATADINE 10 MG PO TABS
10.0000 mg | ORAL_TABLET | Freq: Every day | ORAL | Status: DC
  Administered 2017-10-26: 10 mg via ORAL
  Filled 2017-10-26: qty 1

## 2017-10-26 MED ORDER — ACETAMINOPHEN 500 MG PO TABS
500.0000 mg | ORAL_TABLET | Freq: Once | ORAL | Status: AC
  Administered 2017-10-26: 500 mg via ORAL
  Filled 2017-10-26: qty 1

## 2017-10-29 ENCOUNTER — Encounter: Payer: Self-pay | Admitting: Neurology

## 2017-10-29 ENCOUNTER — Other Ambulatory Visit: Payer: Self-pay

## 2017-10-29 ENCOUNTER — Ambulatory Visit (INDEPENDENT_AMBULATORY_CARE_PROVIDER_SITE_OTHER): Payer: Managed Care, Other (non HMO) | Admitting: Neurology

## 2017-10-29 VITALS — BP 114/76 | HR 90 | Ht 62.0 in | Wt 206.0 lb

## 2017-10-29 DIAGNOSIS — G35 Multiple sclerosis: Secondary | ICD-10-CM | POA: Diagnosis not present

## 2017-10-29 DIAGNOSIS — R252 Cramp and spasm: Secondary | ICD-10-CM | POA: Diagnosis not present

## 2017-10-29 DIAGNOSIS — Z79899 Other long term (current) drug therapy: Secondary | ICD-10-CM | POA: Diagnosis not present

## 2017-10-29 MED ORDER — BACLOFEN 10 MG PO TABS: 10.0000 mg | ORAL_TABLET | Freq: Three times a day (TID) | ORAL | 3 refills | Status: DC

## 2017-10-29 NOTE — Patient Instructions (Addendum)
Increase baclofen to 10mg  three times daily  Continue Tysabri monthly  Please visit [email or web address] for more information on MS  Return to clinic in 5 months

## 2017-10-29 NOTE — Progress Notes (Signed)
Follow-up Visit   Date: 10/29/17    Peggy Banks MRN: [id number] DOB: 1980/03/01   Interim History: Peggy Banks is a 37 y.o. right-handed African American female with hypertension returning to the clinic for follow-up of multiple sclerosis.  The patient was accompanied to the clinic by self.    History of present illness: Since early ~2010, she has had a limp with her right leg and feels that is more effortful for her to move and often slaps the floor when she is walking.  Her leg also feels stiff and heavy.   There is numbness over the sole of the foot and involves the posterior lower calf.   Upon further questioning, she was told ~2005 that there was abnormal change on her MRI for the same complaints and multiple sclerosis was mentioned, but she was never given the diagnosis.  She did not have CSF testing.  She did see neurology for this but after she became pregnant, many of her symptoms improve, and she was lost to follow-up.   During the latter half of 2017, she became more dependent on using grocery carts because it helps maintain balance when she walks.  She has also noticed that right toes on the right foot do not move and her foot tends to want to curl inwards.  In early 2018, patient underwent imaging which showed diffuse white matter changes affecting the brain as well as cervical cord, consistent with multiple sclerosis, with 4 small foci of enhancement. She was treated with 3 days of Solu-Medrol, but did not notice any marked change.  Since starting baclofen, her muscle stiffness and cramps have reduced, especially in the right leg.  She was started Tysabri in [place] 2018 and offered AFO for her right foot weakness.    UPDATE 10/29/2017:  She is here for follow-up visit.  She is doing well and tolerating Tysabri infusions well.  She continues to appreciate improvement of right leg spasticity with baclofen and is able to spread her toes apart, which is new. Right knee  hyperextension still happens, but to a much less severity.  She has no new weakness, numbness/tingling, or falls.  She has many questions regarding lifestyle modification and diet changes with MS.   Medications:   Current Outpatient Medications on File Prior to Visit  Medication Sig Dispense Refill  . acetaminophen (TYLENOL) 500 MG tablet Take 1,000 mg by mouth every 6 (six) hours as needed for mild pain or headache (PRN infusion).    . AMBULATORY NON FORMULARY MEDICATION 1 Units by Other route daily. Right AFO 1 Units 0  . AMBULATORY NON FORMULARY MEDICATION 1 Units by Other route daily. Right AFO 1 Units 0  . B Complex Vitamins (VITAMIN B COMPLEX PO) Take by mouth.    . cetirizine (ZYRTEC) 10 MG tablet Take 10 mg by mouth daily.    . Cholecalciferol (VITAMIN D PO) Take 5,000 Units by mouth.    . Ferrous Gluconate-C-Folic Acid (IRON-C PO) Take by mouth.    Marland Kitchen lisinopril-hydrochlorothiazide (PRINZIDE,ZESTORETIC) 10-12.5 MG tablet Take 1 tablet by mouth daily.    . Multiple Vitamin (MULTIVITAMIN) tablet Take 1 tablet by mouth daily.    . natalizumab (TYSABRI) 300 MG/15ML injection Inject into the vein every 28 (twenty-eight) days.    . Omega-3 Fatty Acids (FISH OIL) 1360 MG CAPS Take 2 capsules by mouth daily.    . rosuvastatin (CRESTOR) 10 MG tablet Take 10 mg by mouth daily.     No current facility-administered  medications on file prior to visit.     Allergies:  Allergies  Allergen Reactions  . Cefdinir Swelling    Review of Systems:  CONSTITUTIONAL: No fevers, chills, night sweats, or weight loss.  EYES: No visual changes or eye pain ENT: No hearing changes.  No history of nose bleeds.   RESPIRATORY: No cough, wheezing and shortness of breath.   CARDIOVASCULAR: Negative for chest pain, and palpitations.   GI: Negative for abdominal discomfort, blood in stools or black stools.  No recent change in bowel habits.   GU:  No history of incontinence.   MUSCLOSKELETAL: No history of  joint pain or swelling.  No myalgias.   SKIN: Negative for lesions, rash, and itching.   ENDOCRINE: Negative for cold or heat intolerance, polydipsia or goiter.   PSYCH:  No depression or anxiety symptoms.   NEURO: As Above.   Vital Signs:  BP 114/76 (BP Location: Left Arm, Patient Position: Sitting, Cuff Size: Normal)   Pulse 90   Ht 5\' 2"  (1.575 m)   Wt 206 lb (93.4 kg)   LMP 10/12/2017   SpO2 98%   BMI 37.68 kg/m   General Medical Exam:   General:  Well appearing, comfortable  Eyes/ENT: see cranial nerve examination.   Neck: No masses appreciated.  Full range of motion without tenderness.  No carotid bruits. Respiratory:  Clear to auscultation, good air entry bilaterally.   Cardiac:  Regular rate and rhythm, no murmur.   Ext:  Mild right knee hyperextension  Neurological Exam: MENTAL STATUS including orientation to time, place, person, recent and remote memory, attention span and concentration, language, and fund of knowledge is normal.  Speech is not dysarthric.  CRANIAL NERVES:  Normal fundoscopic exam.  Pupils round and reactive to light.  Extraocular muscles intact.  Face is symmetric.  MOTOR:  Motor strength is 5/5 in all extremities, except right foot dorsiflexion 5-/5, eversion 4+/5, inversion 4/5, toe extension and flexion 2+/5.  There mild RLE spasticity in the legs.  MSRs:  Right                                                                 Left brachioradialis 3+  brachioradialis 2+  biceps 3+  biceps 2+  triceps 3+  triceps 2+  patellar 3+  patellar 3+  ankle jerk 3+  ankle jerk 2+   SENSORY: Vibration is absent at the toes bilaterally  COORDINATION/GAIT:  Gait appears stable today with reduced R knee hyperextension, mild dragging of the right foot  Data: MRI brain personally reviewed dated 02/23/2006 which shows extensive scatter T2 hyperintensities in the subcortical and periventricular regions, with a larger confluent area on the left frontal horn and  right posterior horn of the lateral ventricle.  There is also involvement of the corpus callosum. There is small foci of enhancement involving the subcortical parito-occiptal region.  MRI brain and cervical spine with and without contrast 02/07/2016:  Findings consistent with multiple sclerosis. Disease is extensive throughout the brain and cervical cord and there is white matter atrophy. Four foci of enhancement/active demyelination are noted.  MRI thoracic spine wwo contrast 03/10/2016:  1. Widespread severe thoracic spinal cord signal abnormality compatible with chronic demyelinating disease. No areas of acute thoracic cord demyelination are  identified. 2. Isolated small right paracentral thoracic disc herniation at T10-T11. Borderline to mild associated spinal stenosis at that level.  MRI brain and cervical spine wwo contrast 03/07/2017: Extensive cerebral white matter changes involving the supratentorial and infratentorial brain, consistent with history of multiple sclerosis, overall similar appearance and not significantly progressed relative to most recent MRI from 02/07/2016. Single punctate nodular focus of enhancement at the parasagittal high right frontal lobe, compatible with active demyelination.  MRI CERVICAL SPINE IMPRESSION: 1. Extensive patchy cord signal abnormality throughout the cervical spinal cord, consistent with demyelinating disease, overall similar relative to previous exam. No evidence for active demyelination. 2. Small central disc protrusions at C4-5 and C6-7 without stenosis.  IMPRESSION/PLAN: Relpasing remitting multiple sclerosis with symptom manifestation in 2005, diagnosed in 2018.  Imaging confirmed multiple sclerosis with high disease burden involving juxtacortical, periventricular, brainstem, and cerebellum as well as short segment demyelinating changes affecting nearly every level of the cervical and thoracic spine.    She was started on Tysabri in April 2018 and  has been tolerating this well, which will be continued.  JCV titers are negative.  She has many questions regarding lifestyle and diet changes with respect to MS.  I answered this to the best of my ability and provided resources for additional information.  Clinically, she has mild right knee hyperextension and foot weakness. Spasticity has improved with baclofen.  PLAN: 1.  Continue monthly Tysabri infusions 2.  Increase baclofen to 51m three times daily - if she develops sedation, stay on lower dose 3.  Check CBC and CMP, JCV at the next visit 4.  Continue vitamin D 5000 units 5.  Recommend annual eye exam  Return to clinic in 5 months  Greater than 50% of this 30 minute visit was spent in counseling, explanation of diagnosis, planning of further management, and coordination of care.     Thank you for allowing me to participate in patient's care.  If I can answer any additional questions, I would be pleased to do so.    Sincerely,    Donika K. Posey Pronto, DO

## 2017-11-23 ENCOUNTER — Encounter (HOSPITAL_COMMUNITY)
Admission: RE | Admit: 2017-11-23 | Discharge: 2017-11-23 | Disposition: A | Discharge status: Home / Self Care | Payer: Managed Care, Other (non HMO) | Source: Ambulatory Visit | Attending: Neurology | Admitting: Neurology

## 2017-11-23 ENCOUNTER — Encounter (HOSPITAL_COMMUNITY): Payer: Self-pay

## 2017-11-23 DIAGNOSIS — G35 Multiple sclerosis: Secondary | ICD-10-CM | POA: Insufficient documentation

## 2017-11-23 DIAGNOSIS — I10 Essential (primary) hypertension: Secondary | ICD-10-CM | POA: Insufficient documentation

## 2017-11-23 MED ORDER — SODIUM CHLORIDE 0.9 % IV SOLN
300.0000 mg | INTRAVENOUS | Status: DC
  Administered 2017-11-23: 300 mg via INTRAVENOUS
  Filled 2017-11-23: qty 15

## 2017-11-23 MED ORDER — LORATADINE 10 MG PO TABS
10.0000 mg | ORAL_TABLET | Freq: Every day | ORAL | Status: DC
  Administered 2017-11-23: 10 mg via ORAL
  Filled 2017-11-23: qty 1

## 2017-11-23 MED ORDER — SODIUM CHLORIDE 0.9 % IV SOLN
Freq: Once | INTRAVENOUS | Status: AC
  Administered 2017-11-23: 12:00:00 via INTRAVENOUS

## 2017-11-23 MED ORDER — ACETAMINOPHEN 500 MG PO TABS
500.0000 mg | ORAL_TABLET | Freq: Once | ORAL | Status: AC
  Administered 2017-11-23: 500 mg via ORAL
  Filled 2017-11-23: qty 1

## 2017-11-23 NOTE — Discharge Instructions (Signed)

## 2017-12-21 ENCOUNTER — Encounter (HOSPITAL_COMMUNITY)
Admission: RE | Admit: 2017-12-21 | Discharge: 2017-12-21 | Disposition: A | Discharge status: Home / Self Care | Payer: Managed Care, Other (non HMO) | Source: Ambulatory Visit | Attending: Neurology | Admitting: Neurology

## 2017-12-21 ENCOUNTER — Encounter (HOSPITAL_COMMUNITY): Payer: Self-pay

## 2017-12-21 DIAGNOSIS — G35 Multiple sclerosis: Secondary | ICD-10-CM | POA: Insufficient documentation

## 2017-12-21 DIAGNOSIS — I10 Essential (primary) hypertension: Secondary | ICD-10-CM | POA: Diagnosis not present

## 2017-12-21 MED ORDER — SODIUM CHLORIDE 0.9 % IV SOLN
300.0000 mg | INTRAVENOUS | Status: DC
  Administered 2017-12-21: 300 mg via INTRAVENOUS
  Filled 2017-12-21: qty 15

## 2017-12-21 MED ORDER — LORATADINE 10 MG PO TABS
10.0000 mg | ORAL_TABLET | ORAL | Status: DC
  Administered 2017-12-21: 10 mg via ORAL
  Filled 2017-12-21: qty 1

## 2017-12-21 MED ORDER — SODIUM CHLORIDE 0.9 % IV SOLN
Freq: Once | INTRAVENOUS | Status: AC
  Administered 2017-12-21: 10:00:00 via INTRAVENOUS

## 2017-12-21 MED ORDER — ACETAMINOPHEN 500 MG PO TABS
500.0000 mg | ORAL_TABLET | Freq: Once | ORAL | Status: AC
  Administered 2017-12-21: 1000 mg via ORAL
  Filled 2017-12-21: qty 1

## 2017-12-21 NOTE — Progress Notes (Signed)
Successful Tysabri Infusion; Pt did not stay Touch recommended 1 hour Post Tysabri Infusion. Pt aware to contact MD office of any complications or issues.

## 2018-01-14 ENCOUNTER — Other Ambulatory Visit: Payer: Self-pay

## 2018-01-14 ENCOUNTER — Other Ambulatory Visit (HOSPITAL_COMMUNITY): Payer: Self-pay | Admitting: General Practice

## 2018-01-18 ENCOUNTER — Other Ambulatory Visit: Payer: Self-pay | Admitting: *Deleted

## 2018-01-18 ENCOUNTER — Encounter (HOSPITAL_COMMUNITY): Payer: Self-pay

## 2018-01-18 ENCOUNTER — Encounter (HOSPITAL_COMMUNITY)
Admission: RE | Admit: 2018-01-18 | Discharge: 2018-01-18 | Disposition: A | Discharge status: Home / Self Care | Payer: Managed Care, Other (non HMO) | Source: Ambulatory Visit | Attending: Neurology | Admitting: Neurology

## 2018-01-18 DIAGNOSIS — I10 Essential (primary) hypertension: Secondary | ICD-10-CM | POA: Insufficient documentation

## 2018-01-18 DIAGNOSIS — G35 Multiple sclerosis: Secondary | ICD-10-CM | POA: Insufficient documentation

## 2018-01-18 MED ORDER — LORATADINE 10 MG PO TABS
10.0000 mg | ORAL_TABLET | Freq: Once | ORAL | Status: AC
  Administered 2018-01-18: 10 mg via ORAL
  Filled 2018-01-18: qty 1

## 2018-01-18 MED ORDER — SODIUM CHLORIDE 0.9 % IV SOLN
INTRAVENOUS | Status: DC
  Administered 2018-01-18: 14:00:00 via INTRAVENOUS

## 2018-01-18 MED ORDER — ACETAMINOPHEN 325 MG PO TABS
650.0000 mg | ORAL_TABLET | Freq: Once | ORAL | Status: AC
  Administered 2018-01-18: 650 mg via ORAL
  Filled 2018-01-18: qty 2

## 2018-01-18 MED ORDER — SODIUM CHLORIDE 0.9 % IV SOLN
300.0000 mg | INTRAVENOUS | Status: DC
  Administered 2018-01-18: 300 mg via INTRAVENOUS
  Filled 2018-01-18: qty 15

## 2018-02-15 ENCOUNTER — Encounter (HOSPITAL_COMMUNITY): Payer: Self-pay

## 2018-02-15 ENCOUNTER — Encounter (HOSPITAL_COMMUNITY)
Admission: RE | Admit: 2018-02-15 | Discharge: 2018-02-15 | Disposition: A | Discharge status: Home / Self Care | Payer: Managed Care, Other (non HMO) | Source: Ambulatory Visit | Attending: Neurology | Admitting: Neurology

## 2018-02-15 DIAGNOSIS — I10 Essential (primary) hypertension: Secondary | ICD-10-CM | POA: Insufficient documentation

## 2018-02-15 DIAGNOSIS — G35 Multiple sclerosis: Secondary | ICD-10-CM | POA: Insufficient documentation

## 2018-02-15 MED ORDER — ACETAMINOPHEN 500 MG PO TABS
ORAL_TABLET | ORAL | Status: AC
  Filled 2018-02-15: qty 2

## 2018-02-15 MED ORDER — LORATADINE 10 MG PO TABS
10.0000 mg | ORAL_TABLET | ORAL | Status: DC
  Administered 2018-02-15: 10 mg via ORAL

## 2018-02-15 MED ORDER — SODIUM CHLORIDE 0.9 % IV SOLN
300.0000 mg | INTRAVENOUS | Status: DC
  Administered 2018-02-15: 300 mg via INTRAVENOUS
  Filled 2018-02-15: qty 15

## 2018-02-15 MED ORDER — ACETAMINOPHEN 500 MG PO TABS
1000.0000 mg | ORAL_TABLET | ORAL | Status: DC
  Administered 2018-02-15: 1000 mg via ORAL

## 2018-02-15 MED ORDER — SODIUM CHLORIDE 0.9 % IV SOLN
INTRAVENOUS | Status: DC
  Administered 2018-02-15: 11:00:00 via INTRAVENOUS

## 2018-02-15 MED ORDER — LORATADINE 10 MG PO TABS
ORAL_TABLET | ORAL | Status: AC
  Filled 2018-02-15: qty 1

## 2018-03-15 NOTE — Progress Notes (Signed)
Follow-up Visit   Date: 03/16/18    Peggy Banks MRN: [id number] DOB: 11-24-1980   Interim History: Peggy Banks is a 38 y.o. right-handed African American female with hypertension returning to the clinic for follow-up of multiple sclerosis.  The patient was accompanied to the clinic by self.    History of present illness: Since early ~2010, she has had a limp with her right leg and feels that is more effortful for her to move and often slaps the floor when she is walking.  Her leg also feels stiff and heavy.   There is numbness over the sole of the foot and involves the posterior lower calf.   Upon further questioning, she was told ~2005 that there was abnormal change on her MRI for the same complaints and multiple sclerosis was mentioned, but she was never given the diagnosis.  She did not have CSF testing.  She did see neurology for this but after she became pregnant, many of her symptoms improve, and she was lost to follow-up.   During the latter half of 2017, she became more dependent on using grocery carts because it helps maintain balance when she walks.  She has also noticed that right toes on the right foot do not move and her foot tends to want to curl inwards.  In early 2018, patient underwent imaging which showed diffuse white matter changes affecting the brain as well as cervical cord, consistent with multiple sclerosis, with 4 small foci of enhancement. She was treated with 3 days of Solu-Medrol, but did not notice any marked change.  Since starting baclofen, her muscle stiffness and cramps have reduced, especially in the right leg.  She was started Tysabri in Aptil 2018 and offered AFO for her right foot weakness.    UPDATE 10/29/2017:  She is doing well and tolerating Tysabri infusions well.  She continues to appreciate improvement of right leg spasticity with baclofen and is able to spread her toes apart, which is new. Right knee hyperextension still happens, but to a much  less severity.  She has many questions regarding lifestyle modification and diet changes with MS.   UPDATE 03/16/2018:  She is here for 5 month follow-up visit.  No new neurological symptoms.  She is tolerating Tysabri but expresses concern of being on an immunosuppressant with fears of corona virus. She is also frustrated with her work because they do not allow her take time walk, stretch, or take breaks and is having difficulty getting them to approve her FMLA.  She is compliant with baclofen 10mg  three times daily, which has helped her leg spasticity.  She is due for labs today.  She has not had any falls, illness, or hospitalization.  She will be starting a new medication for weight loss.    Medications:   Current Outpatient Medications on File Prior to Visit  Medication Sig Dispense Refill  . acetaminophen (TYLENOL) 500 MG tablet Take 1,000 mg by mouth every 6 (six) hours as needed for mild pain or headache (PRN infusion).    . AMBULATORY NON FORMULARY MEDICATION 1 Units by Other route daily. Right AFO 1 Units 0  . AMBULATORY NON FORMULARY MEDICATION 1 Units by Other route daily. Right AFO 1 Units 0  . B Complex Vitamins (VITAMIN B COMPLEX PO) Take by mouth.    . baclofen (LIORESAL) 10 MG tablet Take 1 tablet (10 mg total) by mouth 3 (three) times daily. 270 tablet 3  . cetirizine (ZYRTEC) 10 MG tablet  Take 10 mg by mouth daily.    . Cholecalciferol (VITAMIN D PO) Take 5,000 Units by mouth.    . Ferrous Gluconate-C-Folic Acid (IRON-C PO) Take by mouth.    Marland Kitchen lisinopril-hydrochlorothiazide (PRINZIDE,ZESTORETIC) 10-12.5 MG tablet Take 1 tablet by mouth daily.    . Multiple Vitamin (MULTIVITAMIN) tablet Take 1 tablet by mouth daily.    . natalizumab (TYSABRI) 300 MG/15ML injection Inject into the vein every 28 (twenty-eight) days.    . Omega-3 Fatty Acids (FISH OIL) 1360 MG CAPS Take 2 capsules by mouth daily.    Marland Kitchen QSYMIA 7.5-46 MG CP24     . rosuvastatin (CRESTOR) 10 MG tablet Take 10 mg by  mouth daily.     Current Facility-Administered Medications on File Prior to Visit  Medication Dose Route Frequency Provider Last Rate Last Dose  . 0.9 %  sodium chloride infusion   Intravenous Continuous Posey Pronto,  K, DO 10 mL/hr at 03/16/18 1059    . acetaminophen (TYLENOL) tablet 650 mg  650 mg Oral Q28 days Narda Amber K, DO   650 mg at 03/16/18 0941  . loratadine (CLARITIN) tablet 10 mg  10 mg Oral Q28 days Narda Amber K, DO   10 mg at 03/16/18 0941  . natalizumab (TYSABRI) 300 mg in sodium chloride 0.9 % 100 mL IVPB  300 mg Intravenous Q28 days Alda Berthold, DO   Stopped at 03/16/18 1059    Allergies:  Allergies  Allergen Reactions  . Cefdinir Swelling    Review of Systems:  CONSTITUTIONAL: No fevers, chills, night sweats, or weight loss.  EYES: No visual changes or eye pain ENT: No hearing changes.  No history of nose bleeds.   RESPIRATORY: No cough, wheezing and shortness of breath.   CARDIOVASCULAR: Negative for chest pain, and palpitations.   GI: Negative for abdominal discomfort, blood in stools or black stools.  No recent change in bowel habits.   GU:  No history of incontinence.   MUSCLOSKELETAL: No history of joint pain or swelling.  No myalgias.   SKIN: Negative for lesions, rash, and itching.   ENDOCRINE: Negative for cold or heat intolerance, polydipsia or goiter.   PSYCH:  No depression or anxiety symptoms.   NEURO: As Above.   Vital Signs:  BP 140/70   Pulse (!) 114   Ht 5\' 2"  (1.575 m)   Wt 203 lb 6 oz (92.3 kg)   SpO2 98%   BMI 37.20 kg/m   General Medical Exam:   General:  Well appearing, comfortable  Eyes/ENT: see cranial nerve examination.   Neck:   No carotid bruits. Respiratory:  Clear to auscultation, good air entry bilaterally.   Cardiac:  Regular rate and rhythm, no murmur.   Ext:  No edema, mild right knee hyperextension  Neurological Exam: MENTAL STATUS including orientation to time, place, person, recent and remote memory,  attention span and concentration, language, and fund of knowledge is normal.  Speech is not dysarthric.  CRANIAL NERVES:  Pupils round and reactive to light.  Extraocular muscles intact.  Face is symmetric. Tongue is midline.    MOTOR:  Motor strength is 5/5 in all extremities, except right foot dorsiflexion 5-/5, eversion 4+/5, inversion 4/5, toe extension and flexion 3/5.  There mild RLE spasticity in the legs.  MSRs:  Right  Left brachioradialis 3+  brachioradialis 2+  biceps 3+  biceps 2+  triceps 3+  triceps 2+  patellar 3+  patellar 3+  ankle jerk 3+  ankle jerk 2+   SENSORY: Vibration is absent at the toes bilaterally  COORDINATION/GAIT:  Gait appears stable today with minimal R knee hyperextension, mild dragging of the right foot and inversion of the foot  Data: MRI brain personally reviewed dated 02/23/2006 which shows extensive scatter T2 hyperintensities in the subcortical and periventricular regions, with a larger confluent area on the left frontal horn and right posterior horn of the lateral ventricle.  There is also involvement of the corpus callosum. There is small foci of enhancement involving the subcortical parito-occiptal region.  MRI brain and cervical spine with and without contrast 02/07/2016:  Findings consistent with multiple sclerosis. Disease is extensive throughout the brain and cervical cord and there is white matter atrophy. Four foci of enhancement/active demyelination are noted.  MRI thoracic spine wwo contrast 03/10/2016:  1. Widespread severe thoracic spinal cord signal abnormality compatible with chronic demyelinating disease. No areas of acute thoracic cord demyelination are identified. 2. Isolated small right paracentral thoracic disc herniation at T10-T11. Borderline to mild associated spinal stenosis at that level.  MRI brain and cervical spine wwo contrast 03/07/2017: Extensive cerebral white  matter changes involving the supratentorial and infratentorial brain, consistent with history of multiple sclerosis, overall similar appearance and not significantly progressed relative to most recent MRI from 02/07/2016. Single punctate nodular focus of enhancement at the parasagittal high right frontal lobe, compatible with active demyelination.  MRI CERVICAL SPINE IMPRESSION: 1. Extensive patchy cord signal abnormality throughout the cervical spinal cord, consistent with demyelinating disease, overall similar relative to previous exam. No evidence for active demyelination. 2. Small central disc protrusions at C4-5 and C6-7 without stenosis.  IMPRESSION/PLAN: Relpasing remitting multiple sclerosis with symptom manifestation in 2005, diagnosed in 2018.  Imaging confirmed multiple sclerosis with high disease burden involving juxtacortical, periventricular, brainstem, and cerebellum as well as short segment demyelinating changes affecting nearly every level of the cervical and thoracic spine.    She was started on Tysabri in April 2018 and has been tolerating this well, which will be continued.  JCV titers are negative.    Clinically, she has mild improved right toe extension and flexion.  She continues to have mild right knee hyperextension and foot weakness. Spasticity is improved with baclofen.  PLAN: 1.  Continue monthly Tysabri infusions 2.  Continue baclofen 10mg  TID  3.  MRI brain and cervical spine wwo contrast 4.  Check CBC and CMP, JCV today 5.  Continue vitamin D 5000 units 6.  Letter provided to allow for her to take breaks  Return to clinic in 5 months  Greater than 50% of this 25 minute visit was spent in counseling, explanation of diagnosis, planning of further management, and coordination of care.   Thank you for allowing me to participate in patient's care.  If I can answer any additional questions, I would be pleased to do so.    Sincerely,     K. Posey Pronto,  DO

## 2018-03-16 ENCOUNTER — Encounter (HOSPITAL_COMMUNITY)
Admission: RE | Admit: 2018-03-16 | Discharge: 2018-03-16 | Disposition: A | Discharge status: Home / Self Care | Payer: Managed Care, Other (non HMO) | Source: Ambulatory Visit | Attending: Neurology | Admitting: Neurology

## 2018-03-16 ENCOUNTER — Encounter: Payer: Self-pay | Admitting: Neurology

## 2018-03-16 ENCOUNTER — Encounter: Payer: Self-pay | Admitting: *Deleted

## 2018-03-16 ENCOUNTER — Encounter (HOSPITAL_COMMUNITY): Payer: Self-pay

## 2018-03-16 ENCOUNTER — Other Ambulatory Visit: Payer: Self-pay

## 2018-03-16 ENCOUNTER — Other Ambulatory Visit (INDEPENDENT_AMBULATORY_CARE_PROVIDER_SITE_OTHER): Payer: Managed Care, Other (non HMO)

## 2018-03-16 ENCOUNTER — Ambulatory Visit: Payer: Managed Care, Other (non HMO) | Admitting: Neurology

## 2018-03-16 VITALS — BP 140/70 | HR 114 | Ht 62.0 in | Wt 203.4 lb

## 2018-03-16 DIAGNOSIS — I10 Essential (primary) hypertension: Secondary | ICD-10-CM | POA: Insufficient documentation

## 2018-03-16 DIAGNOSIS — G35 Multiple sclerosis: Secondary | ICD-10-CM | POA: Diagnosis present

## 2018-03-16 DIAGNOSIS — R252 Cramp and spasm: Secondary | ICD-10-CM

## 2018-03-16 DIAGNOSIS — Z79899 Other long term (current) drug therapy: Secondary | ICD-10-CM | POA: Diagnosis not present

## 2018-03-16 LAB — CBC WITH DIFFERENTIAL/PLATELET
BASOS PCT: 0.4 % (ref 0.0–3.0)
Basophils Absolute: 0 10*3/uL (ref 0.0–0.1)
Eosinophils Absolute: 0.1 10*3/uL (ref 0.0–0.7)
Eosinophils Relative: 1.1 % (ref 0.0–5.0)
HCT: 33.3 % — ABNORMAL LOW (ref 36.0–46.0)
Hemoglobin: 11.3 g/dL — ABNORMAL LOW (ref 12.0–15.0)
Lymphocytes Relative: 47.4 % — ABNORMAL HIGH (ref 12.0–46.0)
Lymphs Abs: 2.5 10*3/uL (ref 0.7–4.0)
MCHC: 34.1 g/dL (ref 30.0–36.0)
MCV: 82.8 fl (ref 78.0–100.0)
Monocytes Absolute: 0.3 10*3/uL (ref 0.1–1.0)
Monocytes Relative: 6.3 % (ref 3.0–12.0)
NEUTROS ABS: 2.4 10*3/uL (ref 1.4–7.7)
Neutrophils Relative %: 44.8 % (ref 43.0–77.0)
Platelets: 338 10*3/uL (ref 150.0–400.0)
RBC: 4.02 Mil/uL (ref 3.87–5.11)
RDW: 14.3 % (ref 11.5–15.5)
WBC: 5.2 10*3/uL (ref 4.0–10.5)

## 2018-03-16 LAB — COMPREHENSIVE METABOLIC PANEL
ALT: 14 U/L (ref 0–35)
AST: 13 U/L (ref 0–37)
Albumin: 4.4 g/dL (ref 3.5–5.2)
Alkaline Phosphatase: 45 U/L (ref 39–117)
BUN: 12 mg/dL (ref 6–23)
CHLORIDE: 100 meq/L (ref 96–112)
CO2: 28 meq/L (ref 19–32)
Calcium: 9.6 mg/dL (ref 8.4–10.5)
Creatinine, Ser: 0.71 mg/dL (ref 0.40–1.20)
GFR: 111.87 mL/min (ref >60.00)
Glucose, Bld: 81 mg/dL (ref 70–99)
POTASSIUM: 3.4 meq/L — AB (ref 3.5–5.1)
Sodium: 137 mEq/L (ref 135–145)
Total Bilirubin: 0.3 mg/dL (ref 0.2–1.2)
Total Protein: 7.7 g/dL (ref 6.0–8.3)

## 2018-03-16 MED ORDER — SODIUM CHLORIDE 0.9 % IV SOLN
INTRAVENOUS | Status: DC
  Administered 2018-03-16: 10:00:00 via INTRAVENOUS

## 2018-03-16 MED ORDER — LORATADINE 10 MG PO TABS
10.0000 mg | ORAL_TABLET | ORAL | Status: DC
  Administered 2018-03-16: 10 mg via ORAL
  Filled 2018-03-16: qty 1

## 2018-03-16 MED ORDER — SODIUM CHLORIDE 0.9 % IV SOLN
300.0000 mg | INTRAVENOUS | Status: DC
  Administered 2018-03-16: 300 mg via INTRAVENOUS
  Filled 2018-03-16: qty 15

## 2018-03-16 MED ORDER — ACETAMINOPHEN 325 MG PO TABS
650.0000 mg | ORAL_TABLET | ORAL | Status: DC
  Administered 2018-03-16: 650 mg via ORAL
  Filled 2018-03-16: qty 2

## 2018-03-16 NOTE — Patient Instructions (Signed)
Check labs  MRI brain and cervical spine wwo contrast  Continue baclofen 10mg  three times daily  Return to clinic in 5 months

## 2018-03-16 NOTE — Discharge Instructions (Signed)
Natalizumab injection What is this medicine? NATALIZUMAB (na ta LIZ you mab) is used to treat relapsing multiple sclerosis. This drug is not a cure. It is also used to treat Crohn's disease. This medicine may be used for other purposes; ask your health care provider or pharmacist if you have questions. COMMON BRAND NAME(S): Tysabri What should I tell my health care provider before I take this medicine? They need to know if you have any of these conditions: -immune system problems -progressive multifocal leukoencephalopathy (PML) -an unusual or allergic reaction to natalizumab, other medicines, foods, dyes, or preservatives -pregnant or trying to get pregnant -breast-feeding How should I use this medicine? This medicine is for infusion into a vein. It is given by a health care professional in a hospital or clinic setting. A special MedGuide will be given to you by the pharmacist with each prescription and refill. Be sure to read this information carefully each time. Talk to your pediatrician regarding the use of this medicine in children. This medicine is not approved for use in children. Overdosage: If you think you have taken too much of this medicine contact a poison control center or emergency room at once. NOTE: This medicine is only for you. Do not share this medicine with others. What if I miss a dose? It is important not to miss your dose. Call your doctor or health care professional if you are unable to keep an appointment. What may interact with this medicine? -azathioprine -cyclosporine -interferon -6-mercaptopurine -methotrexate -steroid medicines like prednisone or cortisone -TNF-alpha inhibitors like adalimumab, etanercept, and infliximab -vaccines This list may not describe all possible interactions. Give your health care provider a list of all the medicines, herbs, non-prescription drugs, or dietary supplements you use. Also tell them if you smoke, drink alcohol, or use  illegal drugs. Some items may interact with your medicine. What should I watch for while using this medicine? Your condition will be monitored carefully while you are receiving this medicine. Visit your doctor for regular check ups. Tell your doctor or healthcare professional if your symptoms do not start to get better or if they get worse. Stay away from people who are sick. Call your doctor or health care professional for advice if you get a fever, chills or sore throat, or other symptoms of a cold or flu. Do not treat yourself. In some patients, this medicine may cause a serious brain infection that may cause death. If you have any problems seeing, thinking, speaking, walking, or standing, tell your doctor right away. If you cannot reach your doctor, get urgent medical care. What side effects may I notice from receiving this medicine? Side effects that you should report to your doctor or health care professional as soon as possible: -allergic reactions like skin rash, itching or hives, swelling of the face, lips, or tongue -breathing problems -changes in vision -chest pain -dark urine -depression, feelings of sadness -dizziness -general ill feeling or flu-like symptoms -irregular, missed, or painful menstrual periods -light-colored stools -loss of appetite, nausea -muscle weakness -problems with balance, talking, or walking -right upper belly pain -unusually weak or tired -yellowing of the eyes or skin Side effects that usually do not require medical attention (report to your doctor or health care professional if they continue or are bothersome): -aches, pains -headache -stomach upset -tiredness This list may not describe all possible side effects. Call your doctor for medical advice about side effects. You may report side effects to FDA at 1-800-FDA-1088. Where should I keep   my medicine? This drug is given in a hospital or clinic and will not be stored at home. NOTE: This sheet is  a summary. It may not cover all possible information. If you have questions about this medicine, talk to your doctor, pharmacist, or health care provider.  2019 Elsevier/Gold Standard (2008-02-11 13:33:21)  

## 2018-03-23 ENCOUNTER — Other Ambulatory Visit: Payer: Self-pay | Admitting: *Deleted

## 2018-03-23 ENCOUNTER — Encounter: Payer: Self-pay | Admitting: *Deleted

## 2018-03-23 ENCOUNTER — Telehealth: Payer: Self-pay | Admitting: *Deleted

## 2018-03-23 DIAGNOSIS — G35 Multiple sclerosis: Secondary | ICD-10-CM

## 2018-03-23 DIAGNOSIS — Z79899 Other long term (current) drug therapy: Secondary | ICD-10-CM

## 2018-03-23 LAB — STRATIFY JCV AB (W/ INDEX) W/ RFLX
Index Value: 0.23 — ABNORMAL HIGH
Stratify JCV (TM) Ab w/Reflex Inhibition: UNDETERMINED — AB

## 2018-03-23 LAB — RFLX STRATIFY JCV (TM) AB INHIBITION: JCV Antibody by Inhibition: NEGATIVE

## 2018-03-23 NOTE — Telephone Encounter (Signed)
Results sent via My Chart.  Lab orders mailed to patient.

## 2018-03-23 NOTE — Telephone Encounter (Signed)
-----   Message from Alda Berthold, DO sent at 03/23/2018  8:46 AM EDT ----- Please inform patient that labs are stable.  OK to continue Tysabri, recheck in 6 months. Thanks.

## 2018-04-01 ENCOUNTER — Ambulatory Visit: Payer: Managed Care, Other (non HMO) | Admitting: Neurology

## 2018-04-02 IMAGING — MR MR CERVICAL SPINE WO/W CM
17 of 19 series · 37 of 48 positions shown · IV contrast (multihance)
Comparison: None.

CLINICAL DATA: Spasticity. Right leg weakness. Hyper reflexia.
History of abnormal brain MRI in 8880, concerning for MS but no
official diagnosis or treatment

EXAM:
MRI HEAD WITHOUT AND WITH CONTRAST
MRI CERVICAL SPINE WITHOUT AND WITH CONTRAST
TECHNIQUE: Multiplanar, multiecho pulse sequences of the brain and surrounding
structures, and cervical spine, to include the craniocervical
junction and cervicothoracic junction, were obtained without and
with intravenous contrast.
Creatinine was obtained on site at [HOSPITAL] at [HOSPITAL].
Results: Creatinine 0.7 mg/dL.
CONTRAST:  18mL MULTIHANCE GADOBENATE DIMEGLUMINE 529 MG/ML IV SOLN

[Series 2: T1 · sagittal · 5.0mm · 0.45mm/px · 1 of 21 slices shown (1 of 3)]
[im 1/21]
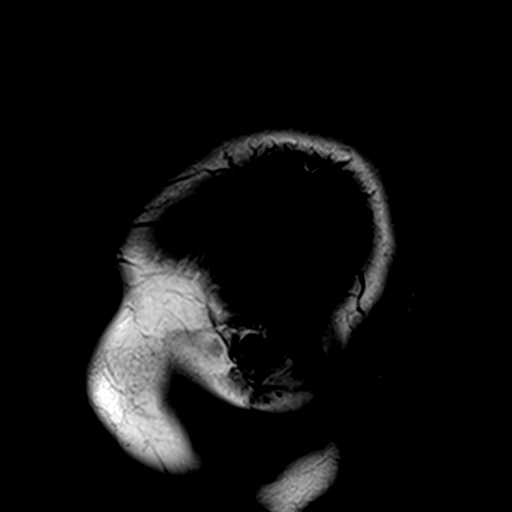

[Series 3: DWI · axial · 3.0mm · 1.80mm/px · z∈[-61,+86]mm · 5 of 100 slices shown (1 of 2)]
[im 1/100]
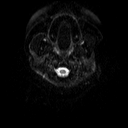
[im 25/100]
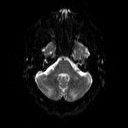
[im 50/100]
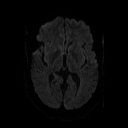
[im 75/100]
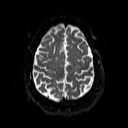
[im 100/100]
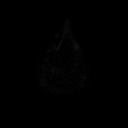

[Series 4: DWI · axial · 3.0mm · 1.80mm/px · z∈[-61,+86]mm · 3 of 47 slices shown (2 of 2)]
[im 1/47]
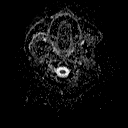
[im 24/47]
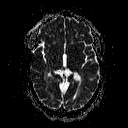
[im 47/47]
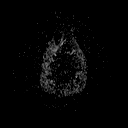

[Series 5: T2 · axial · 5.0mm · 0.51mm/px · 1 of 22 slices shown (1 of 5)]
[im 1/22]
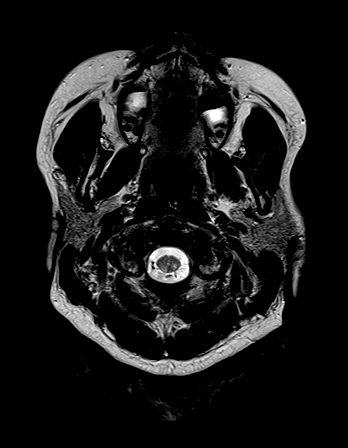

[Series 6: FLAIR · axial · 3.0mm · 0.45mm/px · z∈[-57,+84]mm · 3 of 48 slices shown (1 of 2)]
[im 1/48]
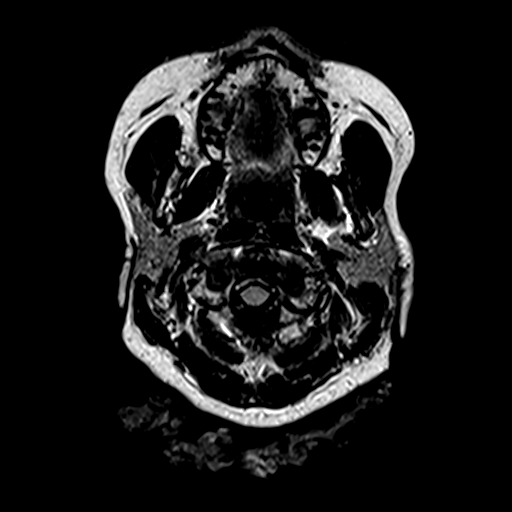
[im 24/48]
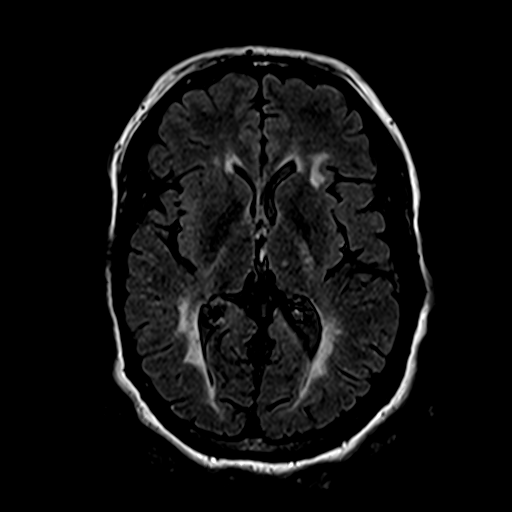
[im 48/48]
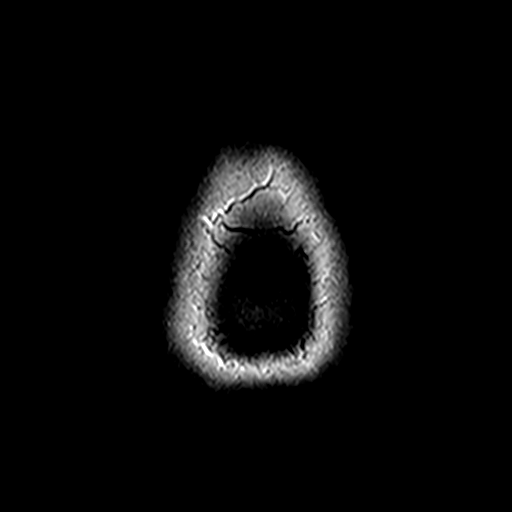

[Series 8: swi_images · axial · 2.0mm · 0.90mm/px · z∈[-61,+97]mm · 5 of 80 slices shown]
[im 1/80]
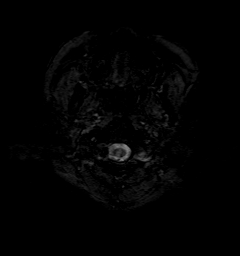
[im 20/80]
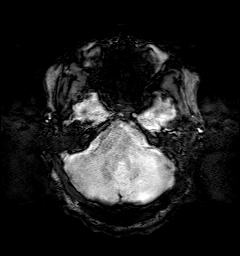
[im 40/80]
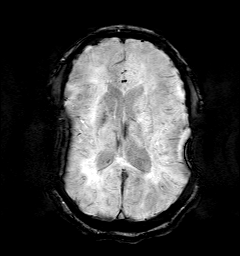
[im 60/80]
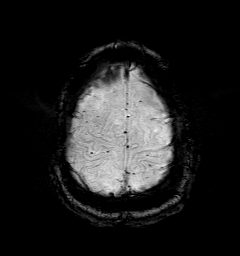
[im 80/80]
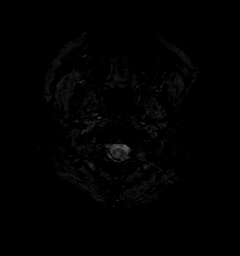

[Series 9: t1_mpr_tra · axial · 1.0mm · 0.75mm/px · z∈[-54,+88]mm · 8 of 144 slices shown]
[im 1/144]
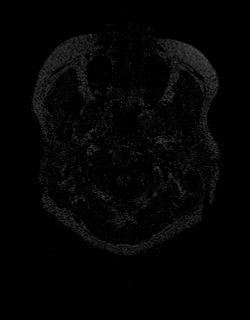
[im 21/144]
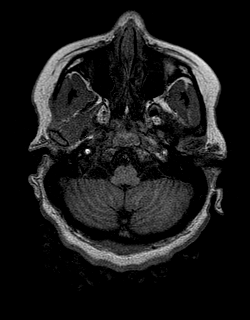
[im 41/144]
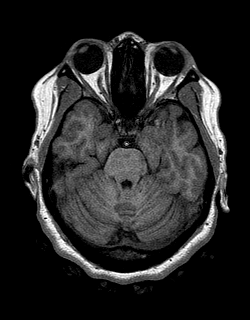
[im 62/144]
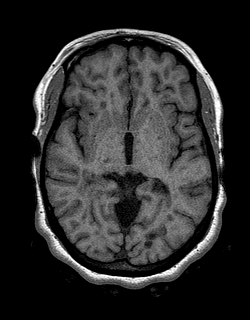
[im 82/144]
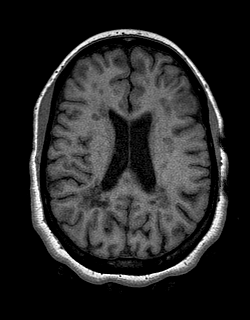
[im 103/144]
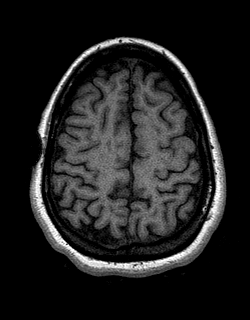
[im 123/144]
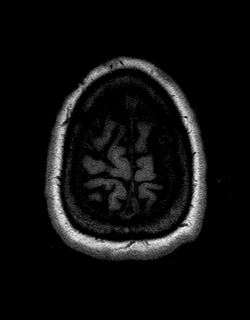
[im 144/144]
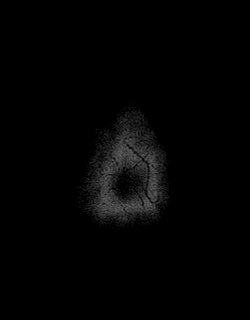

[Series 10: T2 · coronal · 5.0mm · 0.45mm/px · 2 of 28 slices shown (2 of 5)]
[im 1/28]
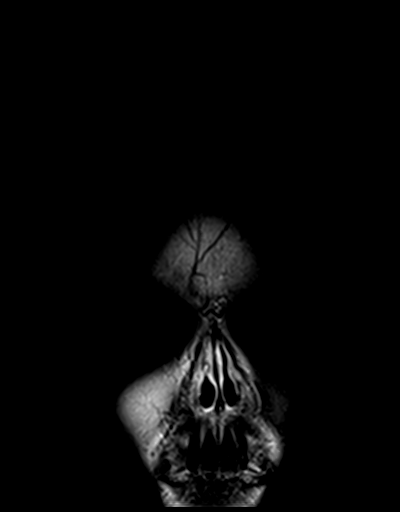
[im 28/28]
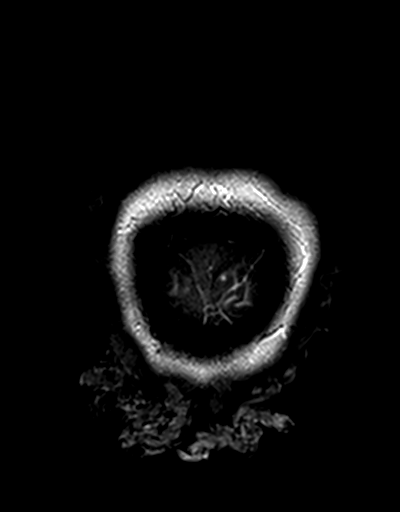

[Series 11: FLAIR · sagittal · 5.0mm · 0.45mm/px · 1 of 27 slices shown (2 of 2)]
[im 1/27]
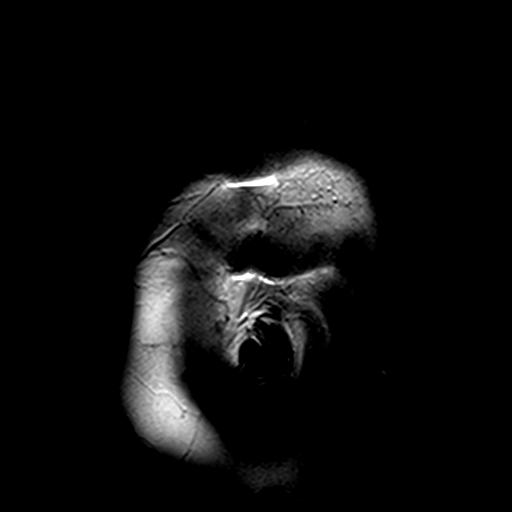

[Series 13: STIR · sagittal · 3.0mm · 0.82mm/px · 1 of 12 slices shown]
[im 1/12]
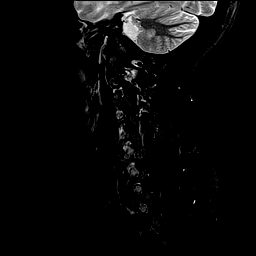

[Series 14: T1 · sagittal · 3.0mm · 0.41mm/px · 1 of 12 slices shown (2 of 3)]
[im 1/12]
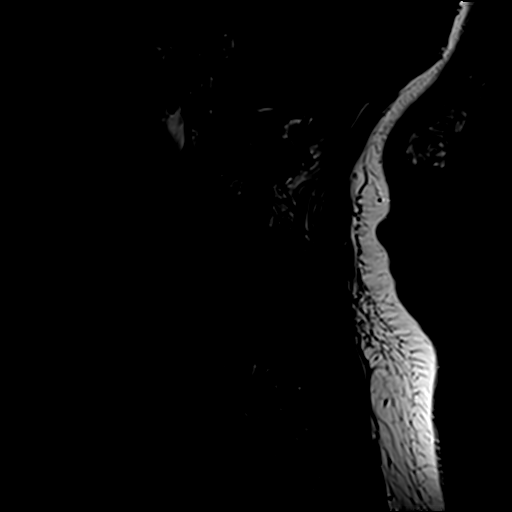

[Series 15: T2 · axial · 3.0mm · 0.39mm/px · 1 of 26 slices shown (3 of 5)]
[im 1/26]
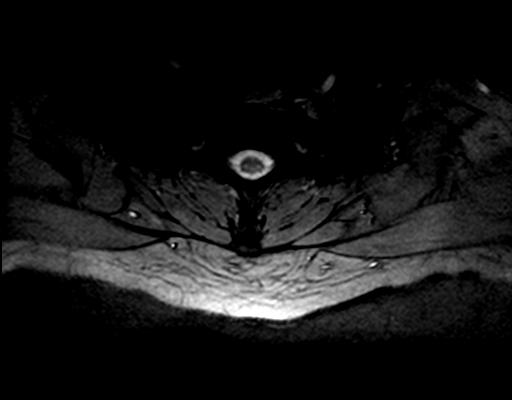

[Series 16: T2 · axial · 3.0mm · 0.39mm/px · 1 of 26 slices shown (4 of 5)]
[im 1/26]
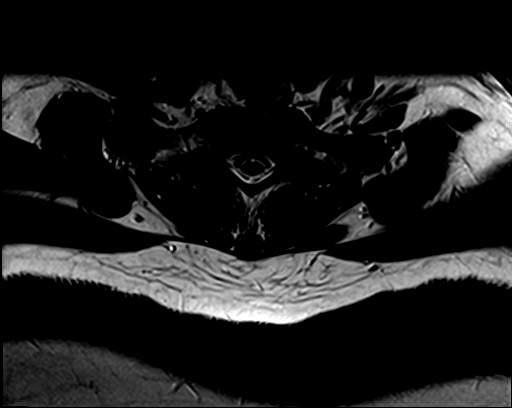

[Series 17: T1 · axial · 3.0mm · 0.35mm/px · 1 of 26 slices shown (3 of 3)]
[im 1/26]
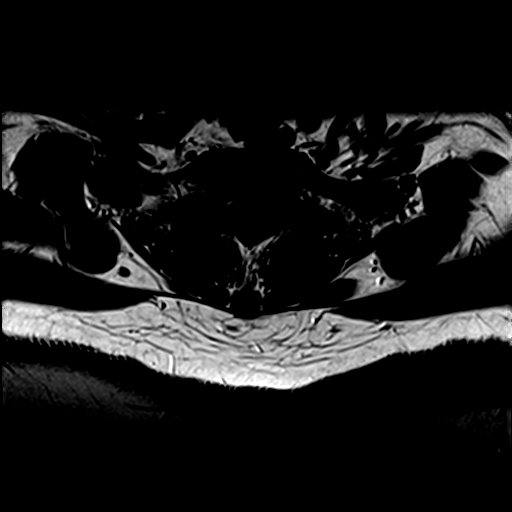

[Series 18: T2 · sagittal · 3.0mm · 0.41mm/px · 1 of 12 slices shown (5 of 5)]
[im 1/12]
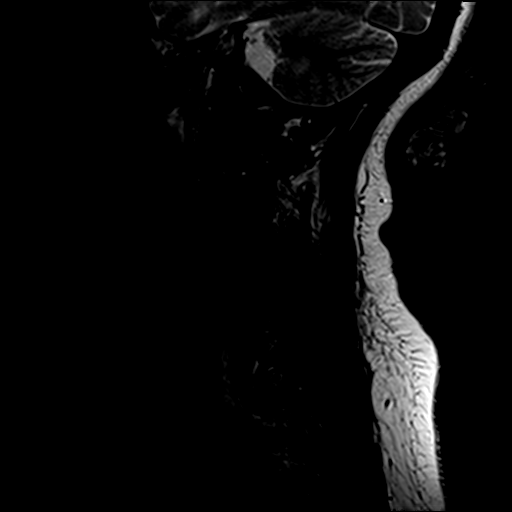

[Series 19: T1 fat-sat post-contrast · sagittal · 3.0mm · 0.82mm/px · 1 of 12 slices shown]
[im 1/12]
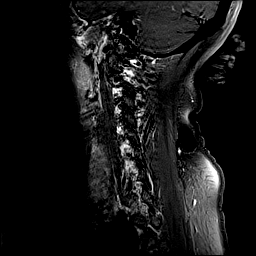

[Series 20: T1 post-contrast · axial · 3.0mm · 0.35mm/px · 1 of 26 slices shown]
[im 1/26]
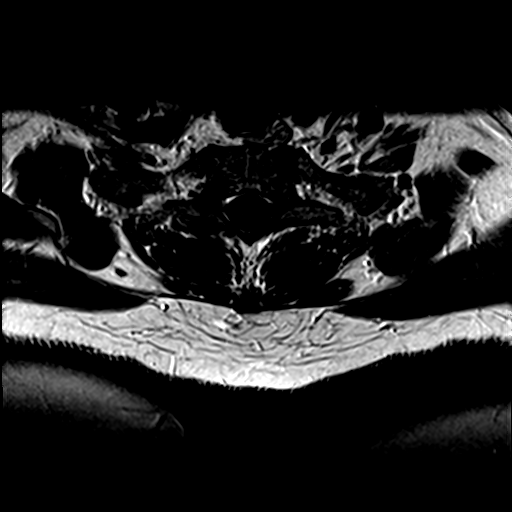

[37 of 48 positions shown; findings below may reference images not displayed]

FINDINGS: MRI HEAD FINDINGS

Brain: Advanced white matter disease with the periventricular
predilection. Periventricular gliosis is confluent in multiple
locations, but there are still distinguishable ovoid radiating
lesions, some with black hole appearance. Multiple juxta cortical
lesions are seen. Numerous bilateral brainstem and cerebellar
lesions. Within these areas of signal abnormality are 4 areas of
nodular and ring enhancement located in the left midbrain/ cerebral
peduncle, bifrontal periventricular white matter, and parasagittal
high right frontal juxta cortex. These findings the criteria for
multiple sclerosis. There is brain atrophy best visualized at the
corpus callosum which shows moderate to advanced thinning.

No acute infarct, acute hemorrhage, hydrocephalus, or mass like
findings.

Vascular: Preserved flow voids

Skull and upper cervical spine: Cervical spine reported separately.
Negative calvarium.

Sinuses/Orbits: Mucous retention cyst on the floors of the maxillary
antra.

MRI CERVICAL SPINE FINDINGS

Alignment: Physiologic.

Vertebrae: No fracture, evidence of discitis, or bone lesion.

Cord: Diffuse demyelinating plaques throughout the cervical cord. On
sagittals STIR imaging discrete short-segment mainly peripheral
lesions are seen at nearly every level. On axial imaging these
become somewhat confluent. None enhance. No cord thinning.

Posterior Fossa, vertebral arteries, paraspinal tissues: Negative

Disc levels: Small disc protrusion at C4-5 and C5-6. No impingement.

These results will be called to the ordering clinician or
representative by the Radiologist Assistant, and communication
documented in the PACS or zVision Dashboard.
IMPRESSION: Findings consistent with multiple sclerosis. Disease is extensive
throughout the brain and cervical cord and there is white matter
atrophy. Four foci of enhancement/active demyelination are noted.

## 2018-04-13 ENCOUNTER — Encounter (HOSPITAL_COMMUNITY): Payer: Managed Care, Other (non HMO)

## 2018-04-15 ENCOUNTER — Ambulatory Visit
Admission: RE | Admit: 2018-04-15 | Discharge: 2018-04-15 | Disposition: A | Discharge status: Home / Self Care | Payer: Managed Care, Other (non HMO) | Source: Ambulatory Visit | Attending: Neurology | Admitting: Neurology

## 2018-04-15 ENCOUNTER — Other Ambulatory Visit: Payer: Self-pay

## 2018-04-15 DIAGNOSIS — Z79899 Other long term (current) drug therapy: Secondary | ICD-10-CM

## 2018-04-15 DIAGNOSIS — G35 Multiple sclerosis: Secondary | ICD-10-CM

## 2018-04-15 DIAGNOSIS — R252 Cramp and spasm: Secondary | ICD-10-CM

## 2018-04-15 MED ORDER — GADOBUTROL 1 MMOL/ML IV SOLN
10.0000 mL | Freq: Once | INTRAVENOUS | Status: AC | PRN
  Administered 2018-04-15: 10 mL via INTRAVENOUS

## 2018-04-18 ENCOUNTER — Encounter: Payer: Self-pay | Admitting: *Deleted

## 2018-04-18 ENCOUNTER — Telehealth: Payer: Self-pay | Admitting: *Deleted

## 2018-04-18 NOTE — Telephone Encounter (Signed)
Results sent via My Chart.  

## 2018-04-18 NOTE — Telephone Encounter (Signed)
-----   Message from Alda Berthold, DO sent at 04/18/2018  9:17 AM EDT ----- Please inform patient that MRI looks good and is stable, no new lesions. Thanks.

## 2018-04-20 ENCOUNTER — Other Ambulatory Visit (HOSPITAL_COMMUNITY): Payer: Self-pay | Admitting: General Practice

## 2018-04-27 ENCOUNTER — Telehealth: Payer: Self-pay | Admitting: *Deleted

## 2018-04-27 NOTE — Telephone Encounter (Signed)
Site authorization number: [id number] [facility]

## 2018-05-11 ENCOUNTER — Other Ambulatory Visit: Payer: Self-pay

## 2018-05-11 ENCOUNTER — Encounter (HOSPITAL_COMMUNITY): Payer: Managed Care, Other (non HMO)

## 2018-05-11 ENCOUNTER — Ambulatory Visit (HOSPITAL_COMMUNITY)
Admission: RE | Admit: 2018-05-11 | Discharge: 2018-05-11 | Disposition: A | Discharge status: Home / Self Care | Payer: Managed Care, Other (non HMO) | Source: Ambulatory Visit | Attending: Neurology | Admitting: Neurology

## 2018-05-11 DIAGNOSIS — G35 Multiple sclerosis: Secondary | ICD-10-CM | POA: Diagnosis present

## 2018-05-11 MED ORDER — SODIUM CHLORIDE 0.9 % IV SOLN
INTRAVENOUS | Status: DC
  Administered 2018-05-11: 11:00:00 via INTRAVENOUS

## 2018-05-11 MED ORDER — ACETAMINOPHEN 325 MG PO TABS
650.0000 mg | ORAL_TABLET | ORAL | Status: DC
  Administered 2018-05-11: 650 mg via ORAL
  Filled 2018-05-11: qty 2

## 2018-05-11 MED ORDER — LORATADINE 10 MG PO TABS
10.0000 mg | ORAL_TABLET | ORAL | Status: DC
  Administered 2018-05-11: 10 mg via ORAL
  Filled 2018-05-11: qty 1

## 2018-05-11 MED ORDER — SODIUM CHLORIDE 0.9 % IV SOLN
300.0000 mg | INTRAVENOUS | Status: DC
  Administered 2018-05-11: 11:00:00 300 mg via INTRAVENOUS
  Filled 2018-05-11: qty 15

## 2018-05-11 NOTE — Discharge Instructions (Signed)
Natalizumab injection What is this medicine? NATALIZUMAB (na ta LIZ you mab) is used to treat relapsing multiple sclerosis. This drug is not a cure. It is also used to treat Crohn's disease. This medicine may be used for other purposes; ask your health care provider or pharmacist if you have questions. COMMON BRAND NAME(S): Tysabri What should I tell my health care provider before I take this medicine? They need to know if you have any of these conditions: -immune system problems -progressive multifocal leukoencephalopathy (PML) -an unusual or allergic reaction to natalizumab, other medicines, foods, dyes, or preservatives -pregnant or trying to get pregnant -breast-feeding How should I use this medicine? This medicine is for infusion into a vein. It is given by a health care professional in a hospital or clinic setting. A special MedGuide will be given to you by the pharmacist with each prescription and refill. Be sure to read this information carefully each time. Talk to your pediatrician regarding the use of this medicine in children. This medicine is not approved for use in children. Overdosage: If you think you have taken too much of this medicine contact a poison control center or emergency room at once. NOTE: This medicine is only for you. Do not share this medicine with others. What if I miss a dose? It is important not to miss your dose. Call your doctor or health care professional if you are unable to keep an appointment. What may interact with this medicine? -azathioprine -cyclosporine -interferon -6-mercaptopurine -methotrexate -steroid medicines like prednisone or cortisone -TNF-alpha inhibitors like adalimumab, etanercept, and infliximab -vaccines This list may not describe all possible interactions. Give your health care provider a list of all the medicines, herbs, non-prescription drugs, or dietary supplements you use. Also tell them if you smoke, drink alcohol, or use  illegal drugs. Some items may interact with your medicine. What should I watch for while using this medicine? Your condition will be monitored carefully while you are receiving this medicine. Visit your doctor for regular check ups. Tell your doctor or healthcare professional if your symptoms do not start to get better or if they get worse. Stay away from people who are sick. Call your doctor or health care professional for advice if you get a fever, chills or sore throat, or other symptoms of a cold or flu. Do not treat yourself. In some patients, this medicine may cause a serious brain infection that may cause death. If you have any problems seeing, thinking, speaking, walking, or standing, tell your doctor right away. If you cannot reach your doctor, get urgent medical care. What side effects may I notice from receiving this medicine? Side effects that you should report to your doctor or health care professional as soon as possible: -allergic reactions like skin rash, itching or hives, swelling of the face, lips, or tongue -breathing problems -changes in vision -chest pain -dark urine -depression, feelings of sadness -dizziness -general ill feeling or flu-like symptoms -irregular, missed, or painful menstrual periods -light-colored stools -loss of appetite, nausea -muscle weakness -problems with balance, talking, or walking -right upper belly pain -unusually weak or tired -yellowing of the eyes or skin Side effects that usually do not require medical attention (report to your doctor or health care professional if they continue or are bothersome): -aches, pains -headache -stomach upset -tiredness This list may not describe all possible side effects. Call your doctor for medical advice about side effects. You may report side effects to FDA at 1-800-FDA-1088. Where should I keep   my medicine? This drug is given in a hospital or clinic and will not be stored at home. NOTE: This sheet is  a summary. It may not cover all possible information. If you have questions about this medicine, talk to your doctor, pharmacist, or health care provider.  2019 Elsevier/Gold Standard (2008-02-11 13:33:21)  

## 2018-05-11 NOTE — Progress Notes (Signed)
Patient received Tysabri via a PIV. Patient refused to wait for 1 hour post infusion.Tolerated well, vitals stable, discharge instructions given, verbalized understanding. Patient alert, oriented and ambulatory at the time of discharge.

## 2018-05-27 ENCOUNTER — Telehealth: Payer: Self-pay

## 2018-05-27 NOTE — Telephone Encounter (Signed)
Please apologize for any confusion - she will not be charged as this is continuation of her FMLA.

## 2018-05-27 NOTE — Telephone Encounter (Signed)
Pt made aware that there will be no charge for the [facility] forms.  Pt wishes to have forms mailed to her home address.  Forms mailed today.

## 2018-05-27 NOTE — Telephone Encounter (Signed)
Pt states that she does not normally pay anything for FMLA forms.  Wanted to review this with you. Pt would also like forms faxed.

## 2018-06-06 ENCOUNTER — Telehealth: Payer: Self-pay | Admitting: Neurology

## 2018-06-06 NOTE — Telephone Encounter (Signed)
Pt called wanting to know the status of her FMLA papers that she dropped at the office a month ago. Pls call her.

## 2018-06-07 DIAGNOSIS — E669 Obesity, unspecified: Secondary | ICD-10-CM | POA: Insufficient documentation

## 2018-06-07 NOTE — Telephone Encounter (Signed)
Advised patient that fmla was faxed 05/27/2018 per note.  Patients says they have not received the paperwork.  Patient has received it and will forward it.

## 2018-06-08 ENCOUNTER — Encounter (HOSPITAL_COMMUNITY): Payer: Managed Care, Other (non HMO)

## 2018-06-09 ENCOUNTER — Ambulatory Visit (HOSPITAL_COMMUNITY)
Admission: RE | Admit: 2018-06-09 | Discharge: 2018-06-09 | Disposition: A | Discharge status: Home / Self Care | Payer: Managed Care, Other (non HMO) | Source: Ambulatory Visit | Attending: Neurology | Admitting: Neurology

## 2018-06-09 ENCOUNTER — Other Ambulatory Visit: Payer: Self-pay

## 2018-06-09 DIAGNOSIS — G35 Multiple sclerosis: Secondary | ICD-10-CM | POA: Diagnosis present

## 2018-06-09 MED ORDER — LORATADINE 10 MG PO TABS
10.0000 mg | ORAL_TABLET | ORAL | Status: DC
  Administered 2018-06-09: 10 mg via ORAL
  Filled 2018-06-09: qty 1

## 2018-06-09 MED ORDER — ACETAMINOPHEN 325 MG PO TABS
650.0000 mg | ORAL_TABLET | ORAL | Status: DC
  Administered 2018-06-09: 650 mg via ORAL
  Filled 2018-06-09: qty 2

## 2018-06-09 MED ORDER — SODIUM CHLORIDE 0.9 % IV SOLN
300.0000 mg | INTRAVENOUS | Status: DC
  Administered 2018-06-09: 300 mg via INTRAVENOUS
  Filled 2018-06-09: qty 15

## 2018-06-09 MED ORDER — SODIUM CHLORIDE 0.9 % IV SOLN
INTRAVENOUS | Status: DC | PRN
  Administered 2018-06-09: 250 mL via INTRAVENOUS

## 2018-06-09 NOTE — Discharge Instructions (Signed)
Natalizumab injection What is this medicine? NATALIZUMAB (na ta LIZ you mab) is used to treat relapsing multiple sclerosis. This drug is not a cure. It is also used to treat Crohn's disease. This medicine may be used for other purposes; ask your health care provider or pharmacist if you have questions. COMMON BRAND NAME(S): Tysabri What should I tell my health care provider before I take this medicine? They need to know if you have any of these conditions: -immune system problems -progressive multifocal leukoencephalopathy (PML) -an unusual or allergic reaction to natalizumab, other medicines, foods, dyes, or preservatives -pregnant or trying to get pregnant -breast-feeding How should I use this medicine? This medicine is for infusion into a vein. It is given by a health care professional in a hospital or clinic setting. A special MedGuide will be given to you by the pharmacist with each prescription and refill. Be sure to read this information carefully each time. Talk to your pediatrician regarding the use of this medicine in children. This medicine is not approved for use in children. Overdosage: If you think you have taken too much of this medicine contact a poison control center or emergency room at once. NOTE: This medicine is only for you. Do not share this medicine with others. What if I miss a dose? It is important not to miss your dose. Call your doctor or health care professional if you are unable to keep an appointment. What may interact with this medicine? -azathioprine -cyclosporine -interferon -6-mercaptopurine -methotrexate -steroid medicines like prednisone or cortisone -TNF-alpha inhibitors like adalimumab, etanercept, and infliximab -vaccines This list may not describe all possible interactions. Give your health care provider a list of all the medicines, herbs, non-prescription drugs, or dietary supplements you use. Also tell them if you smoke, drink alcohol, or use  illegal drugs. Some items may interact with your medicine. What should I watch for while using this medicine? Your condition will be monitored carefully while you are receiving this medicine. Visit your doctor for regular check ups. Tell your doctor or healthcare professional if your symptoms do not start to get better or if they get worse. Stay away from people who are sick. Call your doctor or health care professional for advice if you get a fever, chills or sore throat, or other symptoms of a cold or flu. Do not treat yourself. In some patients, this medicine may cause a serious brain infection that may cause death. If you have any problems seeing, thinking, speaking, walking, or standing, tell your doctor right away. If you cannot reach your doctor, get urgent medical care. What side effects may I notice from receiving this medicine? Side effects that you should report to your doctor or health care professional as soon as possible: -allergic reactions like skin rash, itching or hives, swelling of the face, lips, or tongue -breathing problems -changes in vision -chest pain -dark urine -depression, feelings of sadness -dizziness -general ill feeling or flu-like symptoms -irregular, missed, or painful menstrual periods -light-colored stools -loss of appetite, nausea -muscle weakness -problems with balance, talking, or walking -right upper belly pain -unusually weak or tired -yellowing of the eyes or skin Side effects that usually do not require medical attention (report to your doctor or health care professional if they continue or are bothersome): -aches, pains -headache -stomach upset -tiredness This list may not describe all possible side effects. Call your doctor for medical advice about side effects. You may report side effects to FDA at 1-800-FDA-1088. Where should I keep   my medicine? This drug is given in a hospital or clinic and will not be stored at home. NOTE: This sheet is  a summary. It may not cover all possible information. If you have questions about this medicine, talk to your doctor, pharmacist, or health care provider.  2019 Elsevier/Gold Standard (2008-02-11 13:33:21)  

## 2018-06-09 NOTE — Progress Notes (Signed)
PATIENT CARE CENTER NOTE  Diagnosis: Multiple Sclerosis    Provider: Narda Amber, DO   Procedure: Tysabri   Note: Patient received Tysabri infusion. Pre-medications given per order. Tolerated well with no adverse reaction. Vital signs stable. Patient chose not to wait 1 hour post infusion. Discharge instructions given. Alert, oriented and ambulatory at discharge.

## 2018-07-11 ENCOUNTER — Ambulatory Visit (HOSPITAL_COMMUNITY)
Admission: RE | Admit: 2018-07-11 | Discharge: 2018-07-11 | Disposition: A | Discharge status: Home / Self Care | Payer: Managed Care, Other (non HMO) | Source: Ambulatory Visit | Attending: Neurology | Admitting: Neurology

## 2018-07-11 ENCOUNTER — Other Ambulatory Visit: Payer: Self-pay

## 2018-07-11 DIAGNOSIS — G35 Multiple sclerosis: Secondary | ICD-10-CM | POA: Insufficient documentation

## 2018-07-11 MED ORDER — LORATADINE 10 MG PO TABS
10.0000 mg | ORAL_TABLET | Freq: Every day | ORAL | Status: DC
  Administered 2018-07-11: 10 mg via ORAL
  Filled 2018-07-11: qty 1

## 2018-07-11 MED ORDER — SODIUM CHLORIDE 0.9 % IV SOLN
300.0000 mg | INTRAVENOUS | Status: DC
  Administered 2018-07-11: 300 mg via INTRAVENOUS
  Filled 2018-07-11: qty 15

## 2018-07-11 MED ORDER — ACETAMINOPHEN 325 MG PO TABS
650.0000 mg | ORAL_TABLET | Freq: Once | ORAL | Status: AC
  Administered 2018-07-11: 650 mg via ORAL
  Filled 2018-07-11: qty 2

## 2018-07-11 MED ORDER — SODIUM CHLORIDE 0.9 % IV SOLN
INTRAVENOUS | Status: DC | PRN
  Administered 2018-07-11: 250 mL via INTRAVENOUS

## 2018-07-11 NOTE — Discharge Instructions (Signed)
Natalizumab injection What is this medicine? NATALIZUMAB (na ta LIZ you mab) is used to treat relapsing multiple sclerosis. This drug is not a cure. It is also used to treat Crohn's disease. This medicine may be used for other purposes; ask your health care provider or pharmacist if you have questions. COMMON BRAND NAME(S): Tysabri What should I tell my health care provider before I take this medicine? They need to know if you have any of these conditions:  immune system problems  progressive multifocal leukoencephalopathy (PML)  an unusual or allergic reaction to natalizumab, other medicines, foods, dyes, or preservatives  pregnant or trying to get pregnant  breast-feeding How should I use this medicine? This medicine is for infusion into a vein. It is given by a health care professional in a hospital or clinic setting. A special MedGuide will be given to you by the pharmacist with each prescription and refill. Be sure to read this information carefully each time. Talk to your pediatrician regarding the use of this medicine in children. This medicine is not approved for use in children. Overdosage: If you think you have taken too much of this medicine contact a poison control center or emergency room at once. NOTE: This medicine is only for you. Do not share this medicine with others. What if I miss a dose? It is important not to miss your dose. Call your doctor or health care professional if you are unable to keep an appointment. What may interact with this medicine?  azathioprine  cyclosporine  interferon  6-mercaptopurine  methotrexate  steroid medicines like prednisone or cortisone  TNF-alpha inhibitors like adalimumab, etanercept, and infliximab  vaccines This list may not describe all possible interactions. Give your health care provider a list of all the medicines, herbs, non-prescription drugs, or dietary supplements you use. Also tell them if you smoke, drink  alcohol, or use illegal drugs. Some items may interact with your medicine. What should I watch for while using this medicine? Your condition will be monitored carefully while you are receiving this medicine. Visit your doctor for regular check ups. Tell your doctor or healthcare professional if your symptoms do not start to get better or if they get worse. Stay away from people who are sick. Call your doctor or health care professional for advice if you get a fever, chills or sore throat, or other symptoms of a cold or flu. Do not treat yourself. In some patients, this medicine may cause a serious brain infection that may cause death. If you have any problems seeing, thinking, speaking, walking, or standing, tell your doctor right away. If you cannot reach your doctor, get urgent medical care. What side effects may I notice from receiving this medicine? Side effects that you should report to your doctor or health care professional as soon as possible:  allergic reactions like skin rash, itching or hives, swelling of the face, lips, or tongue  breathing problems  changes in vision  chest pain  dark urine  depression, feelings of sadness  dizziness  general ill feeling or flu-like symptoms  irregular, missed, or painful menstrual periods  light-colored stools  loss of appetite, nausea  muscle weakness  problems with balance, talking, or walking  right upper belly pain  unusually weak or tired  yellowing of the eyes or skin Side effects that usually do not require medical attention (report to your doctor or health care professional if they continue or are bothersome):  aches, pains  headache  stomach upset    tiredness This list may not describe all possible side effects. Call your doctor for medical advice about side effects. You may report side effects to FDA at 1-800-FDA-1088. Where should I keep my medicine? This drug is given in a hospital or clinic and will not be  stored at home. NOTE: This sheet is a summary. It may not cover all possible information. If you have questions about this medicine, talk to your doctor, pharmacist, or health care provider.  2020 Elsevier/Gold Standard (2008-02-11 13:33:21)  

## 2018-07-11 NOTE — Progress Notes (Signed)
PATIENT CARE CENTER NOTE  Diagnosis: Multiple Sclerosis    Provider: Narda Amber, DO   Procedure: Tysabri   Note: Patient received Tysabri infusion. Pre-medications given per order. Tolerated well with no adverse reaction. Vital signs stable. Patient chose not to wait 1 hour post infusion. Discharge instructions given. Alert, oriented and ambulatory at discharge.

## 2018-08-08 ENCOUNTER — Telehealth: Payer: Self-pay

## 2018-08-08 NOTE — Telephone Encounter (Signed)
Billing denials dept called asking about aprroval for natalizumabe it was approved for 03/28/2018 thru 10/30/2018. I contacted the touch program and authorization is valid. [id number], copy to be scanned in chart.sent information over to billing with authorization.

## 2018-08-11 ENCOUNTER — Other Ambulatory Visit: Payer: Self-pay

## 2018-08-11 ENCOUNTER — Encounter (HOSPITAL_COMMUNITY): Payer: Managed Care, Other (non HMO)

## 2018-08-11 ENCOUNTER — Ambulatory Visit (HOSPITAL_COMMUNITY)
Admission: RE | Admit: 2018-08-11 | Discharge: 2018-08-11 | Disposition: A | Discharge status: Home / Self Care | Payer: Managed Care, Other (non HMO) | Source: Ambulatory Visit | Attending: Neurology | Admitting: Neurology

## 2018-08-11 DIAGNOSIS — G35 Multiple sclerosis: Secondary | ICD-10-CM | POA: Insufficient documentation

## 2018-08-11 MED ORDER — ACETAMINOPHEN 325 MG PO TABS
650.0000 mg | ORAL_TABLET | Freq: Once | ORAL | Status: AC
  Administered 2018-08-11: 11:00:00 325 mg via ORAL
  Filled 2018-08-11: qty 2

## 2018-08-11 MED ORDER — LORATADINE 10 MG PO TABS
10.0000 mg | ORAL_TABLET | Freq: Every day | ORAL | Status: DC
  Administered 2018-08-11: 11:00:00 10 mg via ORAL
  Filled 2018-08-11: qty 1

## 2018-08-11 MED ORDER — SODIUM CHLORIDE 0.9 % IV SOLN
300.0000 mg | Freq: Once | INTRAVENOUS | Status: AC
  Administered 2018-08-11: 12:00:00 300 mg via INTRAVENOUS
  Filled 2018-08-11: qty 15

## 2018-08-11 MED ORDER — SODIUM CHLORIDE 0.9 % IV SOLN
INTRAVENOUS | Status: DC | PRN
  Administered 2018-08-11: 12:00:00 250 mL via INTRAVENOUS

## 2018-08-11 MED ORDER — ACETAMINOPHEN 325 MG PO TABS
325.0000 mg | ORAL_TABLET | Freq: Once | ORAL | Status: AC
  Administered 2018-08-11: 11:00:00 325 mg via ORAL
  Filled 2018-08-11: qty 1

## 2018-08-11 NOTE — Progress Notes (Signed)
Patient received Tysabri via PIV. Pre infusion medications - PO Claritin and PO Tylenol were given. Patient declined to wait for post infusion 1 hour observation. Tolerated well, vitals stable, discharge instructions given, verbalized understanding. Patient alert, oriented and ambulatory at the time of discharge.

## 2018-08-11 NOTE — Discharge Instructions (Signed)
Natalizumab injection What is this medicine? NATALIZUMAB (na ta LIZ you mab) is used to treat relapsing multiple sclerosis. This drug is not a cure. It is also used to treat Crohn's disease. This medicine may be used for other purposes; ask your health care provider or pharmacist if you have questions. COMMON BRAND NAME(S): Tysabri What should I tell my health care provider before I take this medicine? They need to know if you have any of these conditions:  immune system problems  progressive multifocal leukoencephalopathy (PML)  an unusual or allergic reaction to natalizumab, other medicines, foods, dyes, or preservatives  pregnant or trying to get pregnant  breast-feeding How should I use this medicine? This medicine is for infusion into a vein. It is given by a health care professional in a hospital or clinic setting. A special MedGuide will be given to you by the pharmacist with each prescription and refill. Be sure to read this information carefully each time. Talk to your pediatrician regarding the use of this medicine in children. This medicine is not approved for use in children. Overdosage: If you think you have taken too much of this medicine contact a poison control center or emergency room at once. NOTE: This medicine is only for you. Do not share this medicine with others. What if I miss a dose? It is important not to miss your dose. Call your doctor or health care professional if you are unable to keep an appointment. What may interact with this medicine?  azathioprine  cyclosporine  interferon  6-mercaptopurine  methotrexate  steroid medicines like prednisone or cortisone  TNF-alpha inhibitors like adalimumab, etanercept, and infliximab  vaccines This list may not describe all possible interactions. Give your health care provider a list of all the medicines, herbs, non-prescription drugs, or dietary supplements you use. Also tell them if you smoke, drink  alcohol, or use illegal drugs. Some items may interact with your medicine. What should I watch for while using this medicine? Your condition will be monitored carefully while you are receiving this medicine. Visit your doctor for regular check ups. Tell your doctor or healthcare professional if your symptoms do not start to get better or if they get worse. Stay away from people who are sick. Call your doctor or health care professional for advice if you get a fever, chills or sore throat, or other symptoms of a cold or flu. Do not treat yourself. In some patients, this medicine may cause a serious brain infection that may cause death. If you have any problems seeing, thinking, speaking, walking, or standing, tell your doctor right away. If you cannot reach your doctor, get urgent medical care. What side effects may I notice from receiving this medicine? Side effects that you should report to your doctor or health care professional as soon as possible:  allergic reactions like skin rash, itching or hives, swelling of the face, lips, or tongue  breathing problems  changes in vision  chest pain  dark urine  depression, feelings of sadness  dizziness  general ill feeling or flu-like symptoms  irregular, missed, or painful menstrual periods  light-colored stools  loss of appetite, nausea  muscle weakness  problems with balance, talking, or walking  right upper belly pain  unusually weak or tired  yellowing of the eyes or skin Side effects that usually do not require medical attention (report to your doctor or health care professional if they continue or are bothersome):  aches, pains  headache  stomach upset    tiredness This list may not describe all possible side effects. Call your doctor for medical advice about side effects. You may report side effects to FDA at 1-800-FDA-1088. Where should I keep my medicine? This drug is given in a hospital or clinic and will not be  stored at home. NOTE: This sheet is a summary. It may not cover all possible information. If you have questions about this medicine, talk to your doctor, pharmacist, or health care provider.  2020 Elsevier/Gold Standard (2008-02-11 13:33:21)  

## 2018-08-16 ENCOUNTER — Telehealth: Payer: Self-pay | Admitting: Neurology

## 2018-08-16 NOTE — Telephone Encounter (Signed)
Seen several messages regarding FMLA from May 2020  Called patient no answer left message to call office back with what she needs

## 2018-08-16 NOTE — Telephone Encounter (Signed)
Sorry I typed the wrong thing,  she wants to speak to someone about her FMLA paper work

## 2018-08-16 NOTE — Telephone Encounter (Signed)
Pt needs to talk to someone about MRI results please call

## 2018-08-17 NOTE — Telephone Encounter (Signed)
Noted Dates added and refax Copy place for scanning with addendum to dates

## 2018-08-17 NOTE — Telephone Encounter (Signed)
Yes, please be sure we have a copy to scan.  Thanks.

## 2018-08-17 NOTE — Telephone Encounter (Signed)
Called patient again today for additional information regarding FMLA forms

## 2018-08-17 NOTE — Telephone Encounter (Signed)
Called spoke with patient she received a letter from [employer] that [employer] paper work section C was missing information.  Reviewed FMLA paper work with patient Start/ end date under section C is missing Per patient dates are to be start 08/24/17 end 08/24/18  Ok to add theses dates and refax?  She states that she will have another set of paper to complete when she returns

## 2018-08-31 ENCOUNTER — Telehealth: Payer: Self-pay | Admitting: Neurology

## 2018-08-31 NOTE — Telephone Encounter (Signed)
Hilda Blades from [place] was wanting you to fax her the Authorization on this patient for March of 2020 Code for Auth: [id number]. Please fax this into to [phone]. Thanks!

## 2018-09-01 NOTE — Telephone Encounter (Signed)
Sent authorization to billing

## 2018-09-12 ENCOUNTER — Encounter (HOSPITAL_COMMUNITY): Payer: Managed Care, Other (non HMO)

## 2018-09-13 ENCOUNTER — Ambulatory Visit (HOSPITAL_COMMUNITY)
Admission: RE | Admit: 2018-09-13 | Discharge: 2018-09-13 | Disposition: A | Discharge status: Home / Self Care | Payer: Managed Care, Other (non HMO) | Source: Ambulatory Visit | Attending: Neurology | Admitting: Neurology

## 2018-09-13 ENCOUNTER — Other Ambulatory Visit: Payer: Self-pay

## 2018-09-13 DIAGNOSIS — G35 Multiple sclerosis: Secondary | ICD-10-CM | POA: Diagnosis present

## 2018-09-13 MED ORDER — SODIUM CHLORIDE 0.9 % IV SOLN
300.0000 mg | INTRAVENOUS | Status: DC
  Administered 2018-09-13: 300 mg via INTRAVENOUS
  Filled 2018-09-13: qty 15

## 2018-09-13 MED ORDER — SODIUM CHLORIDE 0.9 % IV SOLN
INTRAVENOUS | Status: DC | PRN
  Administered 2018-09-13: 10:00:00 250 mL via INTRAVENOUS

## 2018-09-13 MED ORDER — LORATADINE 10 MG PO TABS
10.0000 mg | ORAL_TABLET | Freq: Every day | ORAL | Status: DC
  Administered 2018-09-13: 10 mg via ORAL
  Filled 2018-09-13: qty 1

## 2018-09-13 MED ORDER — ACETAMINOPHEN 325 MG PO TABS
650.0000 mg | ORAL_TABLET | Freq: Once | ORAL | Status: AC
  Administered 2018-09-13: 10:00:00 650 mg via ORAL
  Filled 2018-09-13: qty 2

## 2018-09-13 NOTE — Discharge Instructions (Signed)
Natalizumab injection What is this medicine? NATALIZUMAB (na ta LIZ you mab) is used to treat relapsing multiple sclerosis. This drug is not a cure. It is also used to treat Crohn's disease. This medicine may be used for other purposes; ask your health care provider or pharmacist if you have questions. COMMON BRAND NAME(S): Tysabri What should I tell my health care provider before I take this medicine? They need to know if you have any of these conditions:  immune system problems  progressive multifocal leukoencephalopathy (PML)  an unusual or allergic reaction to natalizumab, other medicines, foods, dyes, or preservatives  pregnant or trying to get pregnant  breast-feeding How should I use this medicine? This medicine is for infusion into a vein. It is given by a health care professional in a hospital or clinic setting. A special MedGuide will be given to you by the pharmacist with each prescription and refill. Be sure to read this information carefully each time. Talk to your pediatrician regarding the use of this medicine in children. This medicine is not approved for use in children. Overdosage: If you think you have taken too much of this medicine contact a poison control center or emergency room at once. NOTE: This medicine is only for you. Do not share this medicine with others. What if I miss a dose? It is important not to miss your dose. Call your doctor or health care professional if you are unable to keep an appointment. What may interact with this medicine?  azathioprine  cyclosporine  interferon  6-mercaptopurine  methotrexate  steroid medicines like prednisone or cortisone  TNF-alpha inhibitors like adalimumab, etanercept, and infliximab  vaccines This list may not describe all possible interactions. Give your health care provider a list of all the medicines, herbs, non-prescription drugs, or dietary supplements you use. Also tell them if you smoke, drink  alcohol, or use illegal drugs. Some items may interact with your medicine. What should I watch for while using this medicine? Your condition will be monitored carefully while you are receiving this medicine. Visit your doctor for regular check ups. Tell your doctor or healthcare professional if your symptoms do not start to get better or if they get worse. Stay away from people who are sick. Call your doctor or health care professional for advice if you get a fever, chills or sore throat, or other symptoms of a cold or flu. Do not treat yourself. In some patients, this medicine may cause a serious brain infection that may cause death. If you have any problems seeing, thinking, speaking, walking, or standing, tell your doctor right away. If you cannot reach your doctor, get urgent medical care. What side effects may I notice from receiving this medicine? Side effects that you should report to your doctor or health care professional as soon as possible:  allergic reactions like skin rash, itching or hives, swelling of the face, lips, or tongue  breathing problems  changes in vision  chest pain  dark urine  depression, feelings of sadness  dizziness  general ill feeling or flu-like symptoms  irregular, missed, or painful menstrual periods  light-colored stools  loss of appetite, nausea  muscle weakness  problems with balance, talking, or walking  right upper belly pain  unusually weak or tired  yellowing of the eyes or skin Side effects that usually do not require medical attention (report to your doctor or health care professional if they continue or are bothersome):  aches, pains  headache  stomach upset    tiredness This list may not describe all possible side effects. Call your doctor for medical advice about side effects. You may report side effects to FDA at 1-800-FDA-1088. Where should I keep my medicine? This drug is given in a hospital or clinic and will not be  stored at home. NOTE: This sheet is a summary. It may not cover all possible information. If you have questions about this medicine, talk to your doctor, pharmacist, or health care provider.  2020 Elsevier/Gold Standard (2008-02-11 13:33:21)  

## 2018-09-13 NOTE — Progress Notes (Signed)
PATIENT CARE CENTER NOTE  Diagnosis:Multiple Sclerosis   Provider:Patel, Donika, DO   Procedure:Tysabri   Note:Patient received Tysabri infusion. Pre-medications given per order. Tolerated well with no adverse reaction. Vital signs stable. Observed patient for 1 hour post-infusion. Discharge instructions given. Alert, oriented and ambulatory at discharge.

## 2018-09-26 ENCOUNTER — Telehealth: Payer: Self-pay | Admitting: Neurology

## 2018-09-26 NOTE — Telephone Encounter (Signed)
Patient left VM about FMLA paperwork. She wanted to know if it was faxed to the [employer] and wanted to see if she could get a copy sent in the mail to her. Thanks!

## 2018-09-27 NOTE — Telephone Encounter (Signed)
Please let patient know they are up front for her to pick up thanks

## 2018-09-28 ENCOUNTER — Telehealth: Payer: Self-pay

## 2018-09-28 NOTE — Telephone Encounter (Signed)
Patient notified labs are due.

## 2018-09-28 NOTE — Telephone Encounter (Signed)
-----   Message from Alda Berthold, DO sent at 09/28/2018  9:07 AM EDT ----- Please remind patient that she is due for labs.  Thanks. ----- Message ----- From: SYSTEM Sent: 09/28/2018  12:05 AM EDT To: Alda Berthold, DO

## 2018-09-30 ENCOUNTER — Telehealth: Payer: Self-pay

## 2018-09-30 ENCOUNTER — Telehealth: Payer: Self-pay | Admitting: Neurology

## 2018-09-30 NOTE — Telephone Encounter (Signed)
Please find out when patient is coming for labs.  Insurance won't authorize her medication until it is labs are drawn.

## 2018-09-30 NOTE — Telephone Encounter (Signed)
Contacted patient to come get labs done.

## 2018-10-03 ENCOUNTER — Other Ambulatory Visit: Payer: Self-pay

## 2018-10-03 ENCOUNTER — Other Ambulatory Visit (INDEPENDENT_AMBULATORY_CARE_PROVIDER_SITE_OTHER): Payer: Managed Care, Other (non HMO)

## 2018-10-03 ENCOUNTER — Telehealth: Payer: Self-pay | Admitting: *Deleted

## 2018-10-03 DIAGNOSIS — G35 Multiple sclerosis: Secondary | ICD-10-CM | POA: Diagnosis not present

## 2018-10-03 DIAGNOSIS — R252 Cramp and spasm: Secondary | ICD-10-CM

## 2018-10-03 DIAGNOSIS — Z79899 Other long term (current) drug therapy: Secondary | ICD-10-CM

## 2018-10-03 LAB — COMPREHENSIVE METABOLIC PANEL
ALT: 12 U/L (ref 0–35)
AST: 13 U/L (ref 0–37)
Albumin: 4.2 g/dL (ref 3.5–5.2)
Alkaline Phosphatase: 42 U/L (ref 39–117)
BUN: 9 mg/dL (ref 6–23)
CO2: 29 mEq/L (ref 19–32)
Calcium: 9.6 mg/dL (ref 8.4–10.5)
Chloride: 102 mEq/L (ref 96–112)
Creatinine, Ser: 0.64 mg/dL (ref 0.40–1.20)
GFR: 125.73 mL/min (ref >60.00)
Glucose, Bld: 113 mg/dL — ABNORMAL HIGH (ref 70–99)
Potassium: 3.6 mEq/L (ref 3.5–5.1)
Sodium: 139 mEq/L (ref 135–145)
Total Bilirubin: 0.5 mg/dL (ref 0.2–1.2)
Total Protein: 7.6 g/dL (ref 6.0–8.3)

## 2018-10-03 LAB — CBC WITH DIFFERENTIAL/PLATELET
Basophils Absolute: 0 10*3/uL (ref 0.0–0.1)
Basophils Relative: 0.3 % (ref 0.0–3.0)
Eosinophils Absolute: 0 10*3/uL (ref 0.0–0.7)
Eosinophils Relative: 0.8 % (ref 0.0–5.0)
HCT: 32.9 % — ABNORMAL LOW (ref 36.0–46.0)
Hemoglobin: 10.9 g/dL — ABNORMAL LOW (ref 12.0–15.0)
Lymphocytes Relative: 44.9 % (ref 12.0–46.0)
Lymphs Abs: 2.7 10*3/uL (ref 0.7–4.0)
MCHC: 33.2 g/dL (ref 30.0–36.0)
MCV: 82.2 fl (ref 78.0–100.0)
Monocytes Absolute: 0.5 10*3/uL (ref 0.1–1.0)
Monocytes Relative: 8.1 % (ref 3.0–12.0)
Neutro Abs: 2.8 10*3/uL (ref 1.4–7.7)
Neutrophils Relative %: 45.9 % (ref 43.0–77.0)
Platelets: 280 10*3/uL (ref 150.0–400.0)
RBC: 4 Mil/uL (ref 3.87–5.11)
RDW: 15.1 % (ref 11.5–15.5)
WBC: 6.1 10*3/uL (ref 4.0–10.5)

## 2018-10-03 NOTE — Telephone Encounter (Signed)
Penny in lab called and patient is here for her routine lab draw and the orders are expired and need to be re-entered.  Verified with Dr. Posey Pronto she wants: CBC/diff with platelet; CMP; JCV with index. MD stated to order above labs. Kieth Brightly made aware and ordering labs.

## 2018-10-06 ENCOUNTER — Telehealth: Payer: Self-pay | Admitting: Neurology

## 2018-10-06 NOTE — Telephone Encounter (Signed)
Faxed to [facility]

## 2018-10-06 NOTE — Telephone Encounter (Signed)
[employer] called regarding Japleen and her Tysabri Status Report. They are needing the Questionaire filled out on section F for re Auth. Please call [phone] and can give a verbal response regarding anti body test. Please Call. Thanks

## 2018-10-06 NOTE — Telephone Encounter (Signed)
Test is still pending

## 2018-10-09 LAB — STRATIFY JCV AB (W/ INDEX) W/ RFLX
Index Value: 0.31 — ABNORMAL HIGH
Stratify JCV (TM) Ab w/Reflex Inhibition: UNDETERMINED — AB

## 2018-10-09 LAB — RFLX STRATIFY JCV (TM) AB INHIBITION: JCV Antibody by Inhibition: NEGATIVE

## 2018-10-11 ENCOUNTER — Ambulatory Visit (HOSPITAL_COMMUNITY)
Admission: RE | Admit: 2018-10-11 | Discharge: 2018-10-11 | Disposition: A | Discharge status: Home / Self Care | Payer: Managed Care, Other (non HMO) | Source: Ambulatory Visit | Attending: Neurology | Admitting: Neurology

## 2018-10-11 ENCOUNTER — Other Ambulatory Visit: Payer: Self-pay

## 2018-10-11 DIAGNOSIS — G35 Multiple sclerosis: Secondary | ICD-10-CM | POA: Insufficient documentation

## 2018-10-11 MED ORDER — SODIUM CHLORIDE 0.9 % IV SOLN
INTRAVENOUS | Status: DC | PRN
  Administered 2018-10-11: 250 mL via INTRAVENOUS

## 2018-10-11 MED ORDER — SODIUM CHLORIDE 0.9 % IV SOLN
300.0000 mg | INTRAVENOUS | Status: DC
  Administered 2018-10-11: 300 mg via INTRAVENOUS
  Filled 2018-10-11: qty 15

## 2018-10-11 MED ORDER — ACETAMINOPHEN 325 MG PO TABS
650.0000 mg | ORAL_TABLET | Freq: Once | ORAL | Status: AC
  Administered 2018-10-11: 650 mg via ORAL
  Filled 2018-10-11: qty 2

## 2018-10-11 MED ORDER — LORATADINE 10 MG PO TABS
10.0000 mg | ORAL_TABLET | Freq: Every day | ORAL | Status: DC
  Administered 2018-10-11: 10 mg via ORAL
  Filled 2018-10-11: qty 1

## 2018-10-11 NOTE — Discharge Instructions (Signed)
Natalizumab injection What is this medicine? NATALIZUMAB (na ta LIZ you mab) is used to treat relapsing multiple sclerosis. This drug is not a cure. It is also used to treat Crohn's disease. This medicine may be used for other purposes; ask your health care provider or pharmacist if you have questions. COMMON BRAND NAME(S): Tysabri What should I tell my health care provider before I take this medicine? They need to know if you have any of these conditions:  immune system problems  progressive multifocal leukoencephalopathy (PML)  an unusual or allergic reaction to natalizumab, other medicines, foods, dyes, or preservatives  pregnant or trying to get pregnant  breast-feeding How should I use this medicine? This medicine is for infusion into a vein. It is given by a health care professional in a hospital or clinic setting. A special MedGuide will be given to you by the pharmacist with each prescription and refill. Be sure to read this information carefully each time. Talk to your pediatrician regarding the use of this medicine in children. This medicine is not approved for use in children. Overdosage: If you think you have taken too much of this medicine contact a poison control center or emergency room at once. NOTE: This medicine is only for you. Do not share this medicine with others. What if I miss a dose? It is important not to miss your dose. Call your doctor or health care professional if you are unable to keep an appointment. What may interact with this medicine?  azathioprine  cyclosporine  interferon  6-mercaptopurine  methotrexate  steroid medicines like prednisone or cortisone  TNF-alpha inhibitors like adalimumab, etanercept, and infliximab  vaccines This list may not describe all possible interactions. Give your health care provider a list of all the medicines, herbs, non-prescription drugs, or dietary supplements you use. Also tell them if you smoke, drink  alcohol, or use illegal drugs. Some items may interact with your medicine. What should I watch for while using this medicine? Your condition will be monitored carefully while you are receiving this medicine. Visit your doctor for regular check ups. Tell your doctor or healthcare professional if your symptoms do not start to get better or if they get worse. Stay away from people who are sick. Call your doctor or health care professional for advice if you get a fever, chills or sore throat, or other symptoms of a cold or flu. Do not treat yourself. In some patients, this medicine may cause a serious brain infection that may cause death. If you have any problems seeing, thinking, speaking, walking, or standing, tell your doctor right away. If you cannot reach your doctor, get urgent medical care. What side effects may I notice from receiving this medicine? Side effects that you should report to your doctor or health care professional as soon as possible:  allergic reactions like skin rash, itching or hives, swelling of the face, lips, or tongue  breathing problems  changes in vision  chest pain  dark urine  depression, feelings of sadness  dizziness  general ill feeling or flu-like symptoms  irregular, missed, or painful menstrual periods  light-colored stools  loss of appetite, nausea  muscle weakness  problems with balance, talking, or walking  right upper belly pain  unusually weak or tired  yellowing of the eyes or skin Side effects that usually do not require medical attention (report to your doctor or health care professional if they continue or are bothersome):  aches, pains  headache  stomach upset    tiredness This list may not describe all possible side effects. Call your doctor for medical advice about side effects. You may report side effects to FDA at 1-800-FDA-1088. Where should I keep my medicine? This drug is given in a hospital or clinic and will not be  stored at home. NOTE: This sheet is a summary. It may not cover all possible information. If you have questions about this medicine, talk to your doctor, pharmacist, or health care provider.  2020 Elsevier/Gold Standard (2008-02-11 13:33:21)  

## 2018-10-11 NOTE — Progress Notes (Signed)
Patient received Tysabri via PIV. Patient was given pre infusion medications - Claritin and Tylenol. Declined 1 hour post infusion observation. Tolerated well, vitals stable, discharge instructions given, verbalized understanding. Patient alert, oriented and ambulatory at the time of discharge.

## 2018-10-28 ENCOUNTER — Telehealth: Payer: Self-pay | Admitting: Neurology

## 2018-10-28 NOTE — Telephone Encounter (Signed)
1. Patient called and said her recent completed FMLA forms did not have Section C completed. She needs this done for her employer as soon as possible please.

## 2018-10-31 NOTE — Telephone Encounter (Signed)
Called patient and pulled FMLA form from media. Specific dates were listed on the second page 09/13/18 - 03/13/19 and they also needed to be listed under part C intermittent leave (was filled out other than date). Completed and refaxed to [place] per patient request at [phone].  Patient stated sometimes her company doesn't receive/accept faxed for FMLA so if there is an issue she is off on Friday and may come in person to have those above dates added to her previously completed (09/14/18) FMLA form.

## 2018-11-03 ENCOUNTER — Other Ambulatory Visit: Payer: Self-pay | Admitting: *Deleted

## 2018-11-03 ENCOUNTER — Telehealth: Payer: Self-pay | Admitting: *Deleted

## 2018-11-03 NOTE — Telephone Encounter (Addendum)
Patient ordered Tysabri infusion Q 28 days. [facility] (where patient has received her last several infusions) at [phone] and made patient's appts for the next 3 months.   Called patient and she is aware of her next 3 infusion appts at patient care center.  Will enter orders now  for infusion next week.  Per Dr. Posey Pronto order:  Pre meds of Tylenol 650 mg po / Claritin 10 mg po ordered prior to Tysabri 300mg   infusion.

## 2018-11-09 ENCOUNTER — Ambulatory Visit (HOSPITAL_COMMUNITY)
Admission: RE | Admit: 2018-11-09 | Discharge: 2018-11-09 | Disposition: A | Discharge status: Home / Self Care | Payer: Managed Care, Other (non HMO) | Source: Ambulatory Visit | Attending: Neurology | Admitting: Neurology

## 2018-11-09 ENCOUNTER — Other Ambulatory Visit: Payer: Self-pay

## 2018-11-09 DIAGNOSIS — G35 Multiple sclerosis: Secondary | ICD-10-CM | POA: Insufficient documentation

## 2018-11-09 MED ORDER — LORATADINE 10 MG PO TABS
10.0000 mg | ORAL_TABLET | Freq: Once | ORAL | Status: AC
  Administered 2018-11-09: 10 mg via ORAL
  Filled 2018-11-09: qty 1

## 2018-11-09 MED ORDER — SODIUM CHLORIDE 0.9 % IV SOLN
INTRAVENOUS | Status: DC | PRN
  Administered 2018-11-09: 250 mL via INTRAVENOUS

## 2018-11-09 MED ORDER — ACETAMINOPHEN 325 MG PO TABS
650.0000 mg | ORAL_TABLET | Freq: Once | ORAL | Status: AC
  Administered 2018-11-09: 650 mg via ORAL
  Filled 2018-11-09: qty 2

## 2018-11-09 MED ORDER — SODIUM CHLORIDE 0.9 % IV SOLN
300.0000 mg | Freq: Once | INTRAVENOUS | Status: AC
  Administered 2018-11-09: 300 mg via INTRAVENOUS
  Filled 2018-11-09: qty 15

## 2018-11-09 NOTE — Discharge Instructions (Signed)
Natalizumab injection What is this medicine? NATALIZUMAB (na ta LIZ you mab) is used to treat relapsing multiple sclerosis. This drug is not a cure. It is also used to treat Crohn's disease. This medicine may be used for other purposes; ask your health care provider or pharmacist if you have questions. COMMON BRAND NAME(S): Tysabri What should I tell my health care provider before I take this medicine? They need to know if you have any of these conditions:  immune system problems  progressive multifocal leukoencephalopathy (PML)  an unusual or allergic reaction to natalizumab, other medicines, foods, dyes, or preservatives  pregnant or trying to get pregnant  breast-feeding How should I use this medicine? This medicine is for infusion into a vein. It is given by a health care professional in a hospital or clinic setting. A special MedGuide will be given to you by the pharmacist with each prescription and refill. Be sure to read this information carefully each time. Talk to your pediatrician regarding the use of this medicine in children. This medicine is not approved for use in children. Overdosage: If you think you have taken too much of this medicine contact a poison control center or emergency room at once. NOTE: This medicine is only for you. Do not share this medicine with others. What if I miss a dose? It is important not to miss your dose. Call your doctor or health care professional if you are unable to keep an appointment. What may interact with this medicine?  azathioprine  cyclosporine  interferon  6-mercaptopurine  methotrexate  steroid medicines like prednisone or cortisone  TNF-alpha inhibitors like adalimumab, etanercept, and infliximab  vaccines This list may not describe all possible interactions. Give your health care provider a list of all the medicines, herbs, non-prescription drugs, or dietary supplements you use. Also tell them if you smoke, drink  alcohol, or use illegal drugs. Some items may interact with your medicine. What should I watch for while using this medicine? Your condition will be monitored carefully while you are receiving this medicine. Visit your doctor for regular check ups. Tell your doctor or healthcare professional if your symptoms do not start to get better or if they get worse. Stay away from people who are sick. Call your doctor or health care professional for advice if you get a fever, chills or sore throat, or other symptoms of a cold or flu. Do not treat yourself. In some patients, this medicine may cause a serious brain infection that may cause death. If you have any problems seeing, thinking, speaking, walking, or standing, tell your doctor right away. If you cannot reach your doctor, get urgent medical care. What side effects may I notice from receiving this medicine? Side effects that you should report to your doctor or health care professional as soon as possible:  allergic reactions like skin rash, itching or hives, swelling of the face, lips, or tongue  breathing problems  changes in vision  chest pain  dark urine  depression, feelings of sadness  dizziness  general ill feeling or flu-like symptoms  irregular, missed, or painful menstrual periods  light-colored stools  loss of appetite, nausea  muscle weakness  problems with balance, talking, or walking  right upper belly pain  unusually weak or tired  yellowing of the eyes or skin Side effects that usually do not require medical attention (report to your doctor or health care professional if they continue or are bothersome):  aches, pains  headache  stomach upset    tiredness This list may not describe all possible side effects. Call your doctor for medical advice about side effects. You may report side effects to FDA at 1-800-FDA-1088. Where should I keep my medicine? This drug is given in a hospital or clinic and will not be  stored at home. NOTE: This sheet is a summary. It may not cover all possible information. If you have questions about this medicine, talk to your doctor, pharmacist, or health care provider.  2020 Elsevier/Gold Standard (2008-02-11 13:33:21)  

## 2018-11-09 NOTE — Progress Notes (Signed)
                   Patient Care Center Note   Diagnosis: Multiple Sclerosis   Provider: Narda Amber, DO   Procedure: IV Tysabri   Note: Received pre-medications and IV Tysabri infusion via PIV. Tolerated well.. Patient declined one hour post-transfusion observation. Vital stable. Discharge instructions given. Alert, oriented and ambulatory at discharge.  Otho Bellows, RN

## 2018-11-16 ENCOUNTER — Telehealth: Payer: Self-pay | Admitting: Neurology

## 2018-11-16 NOTE — Telephone Encounter (Signed)
A Prior Authorization is needed for this patient'sTysabri medication. It keeps getting denied because it needs a New updated Prior Auth. The last one was in June. The number is [id number] or Fax number is [phone]. Thank you

## 2018-11-17 ENCOUNTER — Encounter: Payer: Self-pay | Admitting: *Deleted

## 2018-11-17 NOTE — Telephone Encounter (Signed)
Can you look into this Peggy Banks.Let me know thanks

## 2018-11-17 NOTE — Progress Notes (Signed)
Peggy Banks (Key: [id number]) [name] 300MG [name] concentrate   Form [employer] Tysabri Form  Plan Contact [phone] phone [phone] fax Created 1 hour ago Sent to Plan 1 minute ago Determination Wait for Determination Please wait for the payer to return a determination.

## 2018-11-18 ENCOUNTER — Telehealth: Payer: Self-pay | Admitting: Neurology

## 2018-11-18 ENCOUNTER — Telehealth: Payer: Self-pay

## 2018-11-18 NOTE — Telephone Encounter (Signed)
Peggy Banks is asking that this be transitioned to an out side facility. That nurse case manager looks at case and site that is convient and touch certified. May have to have a peer peer situation per pharmacist.

## 2018-11-18 NOTE — Telephone Encounter (Signed)
Pharmacist from [facility] is calling in about a request for the tysabri- Please call him back at [phone]. Thanks!

## 2018-11-22 ENCOUNTER — Telehealth: Payer: Self-pay | Admitting: Neurology

## 2018-11-22 NOTE — Telephone Encounter (Signed)
Please advise, He called back. Are you willing to do peer to peer.

## 2018-11-22 NOTE — Telephone Encounter (Signed)
Yes, please get details to schedule peer-to-peer.

## 2018-11-22 NOTE — Telephone Encounter (Signed)
Medical director will have to contact office its goes in a work que, if they cant get thru, clinical will reach out. To schedule a time. Fyi

## 2018-11-22 NOTE — Telephone Encounter (Signed)
Ronalee Belts a Pharm with [facility] calling in about tysobri infusion- call him back at [phone]. Thanks!

## 2018-11-23 ENCOUNTER — Other Ambulatory Visit: Payer: Self-pay

## 2018-11-23 ENCOUNTER — Telehealth: Payer: Self-pay

## 2018-11-23 DIAGNOSIS — G35 Multiple sclerosis: Secondary | ICD-10-CM

## 2018-11-23 NOTE — Telephone Encounter (Signed)
I ordered infusion date for tysbari for Dec 02 and dec30 as scheduled.orders placed.

## 2018-11-23 NOTE — Telephone Encounter (Signed)
-----   Message from Jesse Fall, RN sent at 11/04/2018  8:49 AM EDT ----- Regarding: [name]  (21-Dec-1980) is Dr. Serita Grit only patient receiving Tysabri infusion. It is every 28 days. I have her orders in for her Nov appt but she will need orders entered for 12/2 and 12/30 appointments. Please schedule her appointments from January on at [facility] where she is getting her infusions ([phone]).  Pre meds for Peggy Banks per Dr. Posey Pronto are Tylenol 650 mg po / Claritin 10 mg po prior to Tysabri 300mg  [place].  Thanks ladies!Jonna Clark :)

## 2018-11-24 ENCOUNTER — Telehealth: Payer: Self-pay | Admitting: Neurology

## 2018-11-24 NOTE — Telephone Encounter (Signed)
Patient called back with some questions for you. Please Call. Thanks

## 2018-11-24 NOTE — Telephone Encounter (Signed)
Peggy Banks has approved Tysabri from 11/12 - 12/17/2018 to be administered at [facility].  After this date, we need to look at alternative options.  Please see if she would like to get infusion at home or at an out-patient infusion center.  We will need to verify with Peggy Banks what locations are covered, call [phone], to find out.

## 2018-11-24 NOTE — Telephone Encounter (Signed)
I spoke with patient, will look into this further, email has been sent

## 2018-11-24 NOTE — Telephone Encounter (Signed)
I am working with [employer] on this. Patient will be notified soon, will continue treatment at [facility] for 12/07/2018.

## 2018-11-25 ENCOUNTER — Telehealth: Payer: Self-pay | Admitting: Neurology

## 2018-11-25 NOTE — Telephone Encounter (Signed)
Left message at the number above to call back. I left detailed message for authorization until dec 12.

## 2018-11-25 NOTE — Telephone Encounter (Signed)
Caller left msg with after hours about scheduling a peer to peer for the tysobri infusion regarding mutual patient. Thanks!

## 2018-11-25 NOTE — Telephone Encounter (Signed)
Is this still necessary since we are in process of a different center/location for patient?

## 2018-11-25 NOTE — Telephone Encounter (Signed)
Please find out if this is for authorization beyond 12/12 for Tysabri at [facility].

## 2018-11-28 ENCOUNTER — Telehealth: Payer: Self-pay

## 2018-11-28 NOTE — Telephone Encounter (Signed)
[employer] called and yes to continue infusions it will need to be peer to peer. They are going to call back, please let me know when you can schedule this. Thanks

## 2018-11-29 NOTE — Telephone Encounter (Signed)
Peed-to-peer with [facility], Dr. Isac Caddy [phone]), says that Tysabri can no longer be administered at hospital setting after 12/12.  [employer] case manager will be assigned to patient to locate a facility that is within her network and coordinate with [place] next week.  No further action is needed by our office.

## 2018-12-07 ENCOUNTER — Ambulatory Visit (HOSPITAL_COMMUNITY)
Admission: RE | Admit: 2018-12-07 | Discharge: 2018-12-07 | Disposition: A | Discharge status: Home / Self Care | Payer: Managed Care, Other (non HMO) | Source: Ambulatory Visit | Attending: Neurology | Admitting: Neurology

## 2018-12-07 ENCOUNTER — Other Ambulatory Visit: Payer: Self-pay

## 2018-12-07 DIAGNOSIS — G35 Multiple sclerosis: Secondary | ICD-10-CM | POA: Diagnosis present

## 2018-12-07 MED ORDER — ACETAMINOPHEN 325 MG PO TABS
650.0000 mg | ORAL_TABLET | Freq: Once | ORAL | Status: AC
  Administered 2018-12-07: 650 mg via ORAL
  Filled 2018-12-07: qty 2

## 2018-12-07 MED ORDER — SODIUM CHLORIDE 0.9 % IV SOLN
INTRAVENOUS | Status: DC | PRN
  Administered 2018-12-07: 250 mL via INTRAVENOUS

## 2018-12-07 MED ORDER — SODIUM CHLORIDE 0.9 % IV SOLN: 300.0000 mg | INTRAVENOUS | Status: DC

## 2018-12-07 MED ORDER — ACETAMINOPHEN ER 650 MG PO TBCR: 650.0000 mg | EXTENDED_RELEASE_TABLET | Freq: Once | ORAL | Status: DC

## 2018-12-07 MED ORDER — SODIUM CHLORIDE 0.9 % IV SOLN
300.0000 mg | INTRAVENOUS | Status: DC
  Administered 2018-12-07: 300 mg via INTRAVENOUS
  Filled 2018-12-07: qty 15

## 2018-12-07 MED ORDER — LORATADINE 10 MG PO TABS
10.0000 mg | ORAL_TABLET | Freq: Once | ORAL | Status: AC
  Administered 2018-12-07: 10 mg via ORAL
  Filled 2018-12-07: qty 1

## 2018-12-07 MED ORDER — LORATADINE 10 MG PO TABS: 10.0000 mg | ORAL_TABLET | Freq: Every day | ORAL | Status: DC

## 2018-12-07 NOTE — Discharge Instructions (Signed)
Natalizumab injection What is this medicine? NATALIZUMAB (na ta LIZ you mab) is used to treat relapsing multiple sclerosis. This drug is not a cure. It is also used to treat Crohn's disease. This medicine may be used for other purposes; ask your health care provider or pharmacist if you have questions. COMMON BRAND NAME(S): Tysabri What should I tell my health care provider before I take this medicine? They need to know if you have any of these conditions:  immune system problems  progressive multifocal leukoencephalopathy (PML)  an unusual or allergic reaction to natalizumab, other medicines, foods, dyes, or preservatives  pregnant or trying to get pregnant  breast-feeding How should I use this medicine? This medicine is for infusion into a vein. It is given by a health care professional in a hospital or clinic setting. A special MedGuide will be given to you by the pharmacist with each prescription and refill. Be sure to read this information carefully each time. Talk to your pediatrician regarding the use of this medicine in children. This medicine is not approved for use in children. Overdosage: If you think you have taken too much of this medicine contact a poison control center or emergency room at once. NOTE: This medicine is only for you. Do not share this medicine with others. What if I miss a dose? It is important not to miss your dose. Call your doctor or health care professional if you are unable to keep an appointment. What may interact with this medicine?  azathioprine  cyclosporine  interferon  6-mercaptopurine  methotrexate  steroid medicines like prednisone or cortisone  TNF-alpha inhibitors like adalimumab, etanercept, and infliximab  vaccines This list may not describe all possible interactions. Give your health care provider a list of all the medicines, herbs, non-prescription drugs, or dietary supplements you use. Also tell them if you smoke, drink  alcohol, or use illegal drugs. Some items may interact with your medicine. What should I watch for while using this medicine? Your condition will be monitored carefully while you are receiving this medicine. Visit your doctor for regular check ups. Tell your doctor or healthcare professional if your symptoms do not start to get better or if they get worse. Stay away from people who are sick. Call your doctor or health care professional for advice if you get a fever, chills or sore throat, or other symptoms of a cold or flu. Do not treat yourself. In some patients, this medicine may cause a serious brain infection that may cause death. If you have any problems seeing, thinking, speaking, walking, or standing, tell your doctor right away. If you cannot reach your doctor, get urgent medical care. What side effects may I notice from receiving this medicine? Side effects that you should report to your doctor or health care professional as soon as possible:  allergic reactions like skin rash, itching or hives, swelling of the face, lips, or tongue  breathing problems  changes in vision  chest pain  dark urine  depression, feelings of sadness  dizziness  general ill feeling or flu-like symptoms  irregular, missed, or painful menstrual periods  light-colored stools  loss of appetite, nausea  muscle weakness  problems with balance, talking, or walking  right upper belly pain  unusually weak or tired  yellowing of the eyes or skin Side effects that usually do not require medical attention (report to your doctor or health care professional if they continue or are bothersome):  aches, pains  headache  stomach upset    tiredness This list may not describe all possible side effects. Call your doctor for medical advice about side effects. You may report side effects to FDA at 1-800-FDA-1088. Where should I keep my medicine? This drug is given in a hospital or clinic and will not be  stored at home. NOTE: This sheet is a summary. It may not cover all possible information. If you have questions about this medicine, talk to your doctor, pharmacist, or health care provider.  2020 Elsevier/Gold Standard (2008-02-11 13:33:21)  

## 2018-12-07 NOTE — Progress Notes (Signed)
Patient received IV Tysabri as ordered by Narda Amber MD. Pre infusion Tylenol and Claritin were administered. Declined 60 minutes post infusion observation.Tolerated well, vitals stable, discharge instructions given, verbalized understanding. Patient alert, oriented and ambulatory at the time of discharge.

## 2018-12-08 ENCOUNTER — Telehealth: Payer: Self-pay | Admitting: Neurology

## 2018-12-08 NOTE — Telephone Encounter (Signed)
Fransisco Beau called from [employer]. He is needing to speak with someone regarding Tysabri for this patient. Please Call. Thanks

## 2018-12-09 NOTE — Telephone Encounter (Signed)
Phone number given is a fax number not a phone number

## 2018-12-10 ENCOUNTER — Other Ambulatory Visit: Payer: Self-pay | Admitting: Neurology

## 2018-12-12 NOTE — Telephone Encounter (Signed)
Left message for them to call back, all paper work has been faxed with insurance etc

## 2018-12-27 ENCOUNTER — Telehealth: Payer: Self-pay

## 2018-12-27 NOTE — Telephone Encounter (Signed)
Peggy Banks at the patient care center called regarding insurance not covering patients next tysabri infustion at [facility].  Left message for patient to see if Peggy Banks has found another center for patient infusion.

## 2018-12-27 NOTE — Telephone Encounter (Signed)
Patient says she is set up with a different infusion center but is no sure how they are getting the medication.  Patient is going to contact her insurance for clarification.

## 2018-12-28 ENCOUNTER — Telehealth: Payer: Self-pay | Admitting: Neurology

## 2018-12-28 NOTE — Telephone Encounter (Signed)
Left message with the after hour service on 12-28-18 @ 12:34 pm   Caller states she is returning a call from office

## 2018-12-28 NOTE — Telephone Encounter (Signed)
Case Manager with [facility] is aware of situation and authorizations.  She can be contacted with any questions.  [phone] ext [phone]

## 2018-12-28 NOTE — Telephone Encounter (Signed)
Spoke with patient advised her the pharmacy could get the medication through [facility] and that they could get it to the infusion center.  Patient agreeable to proceed and email sent to appropriate persons to process.

## 2019-01-03 ENCOUNTER — Telehealth: Payer: Self-pay | Admitting: Neurology

## 2019-01-03 NOTE — Telephone Encounter (Signed)
Peggy Banks from [place] was calling in to give the Auth # for the patient Peggy Banks for tomorrow. Josem Kaufmann # is [id number] ) This is good for the 1 dose 300mg . Ends on 01/14/19. If you need to call him back his # is [phone] EXT. [id number]. Thanks!

## 2019-01-04 ENCOUNTER — Ambulatory Visit (HOSPITAL_COMMUNITY)
Admission: RE | Admit: 2019-01-04 | Discharge: 2019-01-04 | Disposition: A | Discharge status: Home / Self Care | Payer: Managed Care, Other (non HMO) | Source: Ambulatory Visit | Attending: Internal Medicine | Admitting: Internal Medicine

## 2019-01-04 ENCOUNTER — Other Ambulatory Visit: Payer: Self-pay

## 2019-01-04 DIAGNOSIS — G35 Multiple sclerosis: Secondary | ICD-10-CM | POA: Diagnosis not present

## 2019-01-04 MED ORDER — SODIUM CHLORIDE 0.9 % IV SOLN
300.0000 mg | Freq: Once | INTRAVENOUS | Status: AC
  Administered 2019-01-04: 10:00:00 300 mg via INTRAVENOUS
  Filled 2019-01-04: qty 15

## 2019-01-04 MED ORDER — LORATADINE 10 MG PO TABS
10.0000 mg | ORAL_TABLET | Freq: Once | ORAL | Status: AC
  Administered 2019-01-04: 10 mg via ORAL
  Filled 2019-01-04: qty 1

## 2019-01-04 MED ORDER — SODIUM CHLORIDE 0.9 % IV SOLN
INTRAVENOUS | Status: DC | PRN
  Administered 2019-01-04: 09:00:00 250 mL via INTRAVENOUS

## 2019-01-04 MED ORDER — ACETAMINOPHEN 325 MG PO TABS
650.0000 mg | ORAL_TABLET | Freq: Once | ORAL | Status: AC
  Administered 2019-01-04: 09:00:00 650 mg via ORAL
  Filled 2019-01-04: qty 2

## 2019-01-04 NOTE — Progress Notes (Signed)
                   Patient Care Center Note   Diagnosis: Multiple Sclerosis   Provider: Narda Amber, DO   Procedure: IV Tysabri   Note: Received pre-medications and IV Tysabri infusion via PIV. Tolerated well.. Patient declined one hour post-transfusion observation. Vital stable. Discharge instructions given. Alert, oriented and ambulatory at discharge.  Otho Bellows, RN

## 2019-01-04 NOTE — Discharge Instructions (Signed)
Natalizumab injection What is this medicine? NATALIZUMAB (na ta LIZ you mab) is used to treat relapsing multiple sclerosis. This drug is not a cure. It is also used to treat Crohn's disease. This medicine may be used for other purposes; ask your health care provider or pharmacist if you have questions. COMMON BRAND NAME(S): Tysabri What should I tell my health care provider before I take this medicine? They need to know if you have any of these conditions:  immune system problems  progressive multifocal leukoencephalopathy (PML)  an unusual or allergic reaction to natalizumab, other medicines, foods, dyes, or preservatives  pregnant or trying to get pregnant  breast-feeding How should I use this medicine? This medicine is for infusion into a vein. It is given by a health care professional in a hospital or clinic setting. A special MedGuide will be given to you by the pharmacist with each prescription and refill. Be sure to read this information carefully each time. Talk to your pediatrician regarding the use of this medicine in children. This medicine is not approved for use in children. Overdosage: If you think you have taken too much of this medicine contact a poison control center or emergency room at once. NOTE: This medicine is only for you. Do not share this medicine with others. What if I miss a dose? It is important not to miss your dose. Call your doctor or health care professional if you are unable to keep an appointment. What may interact with this medicine?  azathioprine  cyclosporine  interferon  6-mercaptopurine  methotrexate  steroid medicines like prednisone or cortisone  TNF-alpha inhibitors like adalimumab, etanercept, and infliximab  vaccines This list may not describe all possible interactions. Give your health care provider a list of all the medicines, herbs, non-prescription drugs, or dietary supplements you use. Also tell them if you smoke, drink  alcohol, or use illegal drugs. Some items may interact with your medicine. What should I watch for while using this medicine? Your condition will be monitored carefully while you are receiving this medicine. Visit your doctor for regular check ups. Tell your doctor or healthcare professional if your symptoms do not start to get better or if they get worse. Stay away from people who are sick. Call your doctor or health care professional for advice if you get a fever, chills or sore throat, or other symptoms of a cold or flu. Do not treat yourself. In some patients, this medicine may cause a serious brain infection that may cause death. If you have any problems seeing, thinking, speaking, walking, or standing, tell your doctor right away. If you cannot reach your doctor, get urgent medical care. What side effects may I notice from receiving this medicine? Side effects that you should report to your doctor or health care professional as soon as possible:  allergic reactions like skin rash, itching or hives, swelling of the face, lips, or tongue  breathing problems  changes in vision  chest pain  dark urine  depression, feelings of sadness  dizziness  general ill feeling or flu-like symptoms  irregular, missed, or painful menstrual periods  light-colored stools  loss of appetite, nausea  muscle weakness  problems with balance, talking, or walking  right upper belly pain  unusually weak or tired  yellowing of the eyes or skin Side effects that usually do not require medical attention (report to your doctor or health care professional if they continue or are bothersome):  aches, pains  headache  stomach upset    tiredness This list may not describe all possible side effects. Call your doctor for medical advice about side effects. You may report side effects to FDA at 1-800-FDA-1088. Where should I keep my medicine? This drug is given in a hospital or clinic and will not be  stored at home. NOTE: This sheet is a summary. It may not cover all possible information. If you have questions about this medicine, talk to your doctor, pharmacist, or health care provider.  2020 Elsevier/Gold Standard (2008-02-11 13:33:21)  

## 2019-01-04 NOTE — Progress Notes (Addendum)
Peggy Banks (Key: [id number]) [name] 300MG [name] concentrate   Form [employer] Tysabri Form  Plan Contact [phone] phone [phone] fax Created 2 months ago Sent to Plan 2 months ago Determination Favorable 20 hours ago  Valid 12/08/2018-12/08/2019

## 2019-01-20 ENCOUNTER — Telehealth: Payer: Self-pay

## 2019-01-20 NOTE — Telephone Encounter (Signed)
Spoke with Franchot Erichsen about one week ago in regards to pt Peggy Banks infusion site. Per Otila Kluver she had to use the pharamacy stock of the medication because pts medication had not arrived yet. When the location changed from [facility] hospital to [facility] hospital the pt's insurance denied the infusion medication.  Message sent to [facility] who precerts our procedures to see about fixing the location. This infusion was performed on 01/04/19

## 2019-01-23 ENCOUNTER — Telehealth: Payer: Self-pay

## 2019-01-23 NOTE — Telephone Encounter (Signed)
-----   Message from Peggy Banks sent at 01/23/2019  9:09 AM EST ----- Regarding: RE: Infusion site change [name] I am not sure who would handle that  I'm sorry I have never done those usually the nurse would handle drug auth  ----- Message ----- From: Peggy Dublin, LPN Sent: [id number]   3:41 PM EST To: Peggy Banks Subject: Infusion site change                           The pharmacist is calling from [facility] stating that the location changed for this patient's infusion. Her name is Peggy Banks. I am not sure who needs to fix this and may be you can or direct me on who can. The pharmacist explained that the patients Tysabri infusion site location was changed. The original location I believe was [facility] and it was approved? Since the site location changed for whatever reason the pharmacist had to use her stock of medication. Now insurance has denied the authorization for the medication that she actually used out of her stock???  I dont know how to help this person? Do you contact the insurance and fix the location?  Thank you for your help,  Caryl Pina

## 2019-01-23 NOTE — Telephone Encounter (Signed)
Per Maple Hudson has been approved from 12/08/2018-12/08/2019.  Faxed [facility] pharmacy and [facility] so both are aware in case pt has infusions at either hospital.  Approval noticed scanned under media tab.

## 2019-01-25 ENCOUNTER — Telehealth: Payer: Self-pay | Admitting: Neurology

## 2019-01-25 NOTE — Telephone Encounter (Signed)
Otila Kluver from [facility] called requesting a call back about the patient's [employer] coverage for her infusions. There is a problem they need help resolving.

## 2019-01-25 NOTE — Telephone Encounter (Signed)
Please advise, unsure who does prior authorizations on infusions in the office

## 2019-01-26 NOTE — Telephone Encounter (Signed)
Any insite on this, I havent seen anything

## 2019-01-27 NOTE — Telephone Encounter (Signed)
I have spoke with the [facility] dept [phone]). The authorization with Zacarias Pontes has been cancelled( See prior telephone encounter with auth #). This was confirmed with Otila Kluver at [facility] because the Tysabri shipment was cancelled as well.  Per Steffanie Rainwater there is a new authorization with [facility] that is good until 12/08/2019. Josem Kaufmann number [id number]  Otila Kluver at [facility] is going to call the PA dept and speak to [place] to see if they can figure out away to replace the Tysabri that [facility] has already given to patient.

## 2019-02-01 ENCOUNTER — Inpatient Hospital Stay (HOSPITAL_COMMUNITY)
Admission: RE | Admit: 2019-02-01 | Discharge: 2019-02-01 | Disposition: A | Discharge status: Home / Self Care | Payer: Managed Care, Other (non HMO) | Source: Ambulatory Visit

## 2019-02-07 ENCOUNTER — Ambulatory Visit (HOSPITAL_COMMUNITY)
Admission: RE | Admit: 2019-02-07 | Discharge: 2019-02-07 | Disposition: A | Discharge status: Home / Self Care | Payer: Managed Care, Other (non HMO) | Source: Ambulatory Visit | Attending: Neurology | Admitting: Neurology

## 2019-02-07 ENCOUNTER — Other Ambulatory Visit: Payer: Self-pay

## 2019-02-07 DIAGNOSIS — G35 Multiple sclerosis: Secondary | ICD-10-CM | POA: Diagnosis present

## 2019-02-07 MED ORDER — SODIUM CHLORIDE 0.9 % IV SOLN
300.0000 mg | INTRAVENOUS | Status: DC
  Administered 2019-02-07: 09:00:00 300 mg via INTRAVENOUS
  Filled 2019-02-07: qty 15

## 2019-02-07 MED ORDER — LORATADINE 10 MG PO TABS
10.0000 mg | ORAL_TABLET | Freq: Every day | ORAL | Status: DC
  Administered 2019-02-07: 08:00:00 10 mg via ORAL
  Filled 2019-02-07: qty 1

## 2019-02-07 MED ORDER — SODIUM CHLORIDE 0.9 % IV SOLN
INTRAVENOUS | Status: DC | PRN
  Administered 2019-02-07: 09:00:00 250 mL via INTRAVENOUS

## 2019-02-07 MED ORDER — ACETAMINOPHEN 325 MG PO TABS
650.0000 mg | ORAL_TABLET | Freq: Once | ORAL | Status: AC
  Administered 2019-02-07: 08:00:00 650 mg via ORAL
  Filled 2019-02-07: qty 2

## 2019-02-07 NOTE — Progress Notes (Signed)
Patient Care Center Note   Diagnosis: Multiple Sclerosis   Provider: Narda Amber, DO   Procedure: IV Tysabri   Note: Received pre-medications and IV Tysabri infusion via PIV. Tolerated well.. Patient declined one hour post-transfusion observation. Vital stable. Discharge instructions given. Alert, oriented and ambulatory at discharge.

## 2019-02-07 NOTE — Discharge Instructions (Signed)
Natalizumab injection What is this medicine? NATALIZUMAB (na ta LIZ you mab) is used to treat relapsing multiple sclerosis. This drug is not a cure. It is also used to treat Crohn's disease. This medicine may be used for other purposes; ask your health care provider or pharmacist if you have questions. COMMON BRAND NAME(S): Tysabri What should I tell my health care provider before I take this medicine? They need to know if you have any of these conditions:  immune system problems  progressive multifocal leukoencephalopathy (PML)  an unusual or allergic reaction to natalizumab, other medicines, foods, dyes, or preservatives  pregnant or trying to get pregnant  breast-feeding How should I use this medicine? This medicine is for infusion into a vein. It is given by a health care professional in a hospital or clinic setting. A special MedGuide will be given to you by the pharmacist with each prescription and refill. Be sure to read this information carefully each time. Talk to your pediatrician regarding the use of this medicine in children. This medicine is not approved for use in children. Overdosage: If you think you have taken too much of this medicine contact a poison control center or emergency room at once. NOTE: This medicine is only for you. Do not share this medicine with others. What if I miss a dose? It is important not to miss your dose. Call your doctor or health care professional if you are unable to keep an appointment. What may interact with this medicine? Do not take this medicine with any of the following medications:  biologic medicines such as adalimumab, certolizumab, etanercept, golimumab, infliximab This medicine may also interact with the following medications:  azathioprine  cyclosporine  interferons  6-mercaptopurine  methotrexate  other medicines that lower your chance of fighting an infection  steroid medicines like prednisone or  cortisone  vaccines This list may not describe all possible interactions. Give your health care provider a list of all the medicines, herbs, non-prescription drugs, or dietary supplements you use. Also tell them if you smoke, drink alcohol, or use illegal drugs. Some items may interact with your medicine. What should I watch for while using this medicine? Your condition will be monitored carefully while you are receiving this medicine. Visit your doctor for regular check ups. Tell your doctor or healthcare professional if your symptoms do not start to get better or if they get worse. Stay away from people who are sick. Call your doctor or health care professional for advice if you get a fever, chills or sore throat, or other symptoms of a cold or flu. Do not treat yourself. In some patients, this medicine may cause a serious brain infection that may cause death. If you have any problems seeing, thinking, speaking, walking, or standing, tell your doctor right away. If you cannot reach your doctor, get urgent medical care. What side effects may I notice from receiving this medicine? Side effects that you should report to your doctor or health care professional as soon as possible:  allergic reactions like skin rash, itching or hives, swelling of the face, lips, or tongue  breathing problems  changes in vision  chest pain  confusion  depressed mood  dizziness  feeling faint; lightheaded; falls  general ill feeling or flu-like symptoms  loss of memory  missed menstrual periods  muscle weakness  problems with balance, talking, or walking  signs and symptoms of liver injury like dark yellow or brown urine; general ill feeling or flu-like symptoms; light-colored  stools; loss of appetite; nausea; right upper belly pain; unusually weak or tired; yellowing of the eyes or skin  suicidal thoughts, mood changes  unusual bruising or bleeding  unusually weak or tired Side effects that  usually do not require medical attention (report to your doctor or health care professional if they continue or are bothersome):  headache  joint pain  muscle cramps  muscle pain  nausea, vomiting  pain, redness, or irritation at site where injected  tiredness This list may not describe all possible side effects. Call your doctor for medical advice about side effects. You may report side effects to FDA at 1-800-FDA-1088. Where should I keep my medicine? This drug is given in a hospital or clinic and will not be stored at home. NOTE: This sheet is a summary. It may not cover all possible information. If you have questions about this medicine, talk to your doctor, pharmacist, or health care provider.  2020 Elsevier/Gold Standard (2018-06-27 13:20:26)  

## 2019-03-06 ENCOUNTER — Ambulatory Visit (HOSPITAL_COMMUNITY)
Admission: RE | Admit: 2019-03-06 | Discharge: 2019-03-06 | Disposition: A | Discharge status: Home / Self Care | Payer: Managed Care, Other (non HMO) | Source: Ambulatory Visit | Attending: Neurology | Admitting: Neurology

## 2019-03-06 ENCOUNTER — Other Ambulatory Visit: Payer: Self-pay

## 2019-03-06 DIAGNOSIS — G35 Multiple sclerosis: Secondary | ICD-10-CM | POA: Insufficient documentation

## 2019-03-06 MED ORDER — SODIUM CHLORIDE 0.9 % IV SOLN
INTRAVENOUS | Status: DC | PRN
  Administered 2019-03-06: 250 mL via INTRAVENOUS

## 2019-03-06 MED ORDER — LORATADINE 10 MG PO TABS
10.0000 mg | ORAL_TABLET | Freq: Every day | ORAL | Status: DC
  Administered 2019-03-06: 10 mg via ORAL
  Filled 2019-03-06: qty 1

## 2019-03-06 MED ORDER — SODIUM CHLORIDE 0.9 % IV SOLN
300.0000 mg | INTRAVENOUS | Status: DC
  Administered 2019-03-06: 300 mg via INTRAVENOUS
  Filled 2019-03-06: qty 15

## 2019-03-06 MED ORDER — ACETAMINOPHEN 325 MG PO TABS
650.0000 mg | ORAL_TABLET | Freq: Once | ORAL | Status: AC
  Administered 2019-03-06: 650 mg via ORAL
  Filled 2019-03-06: qty 2

## 2019-03-06 NOTE — Progress Notes (Signed)
Patient Care Center Note   Diagnosis:Multiple Sclerosis   Provider:Patel, Donika, DO   Procedure:IV Tysabri   Note:Received pre-medications and IV Tysabri infusion via PIV. Tolerated well.. Patient declined one hour post-transfusion observation. Vital stable. Discharge instructions given. Alert, oriented and ambulatory at discharge.

## 2019-03-06 NOTE — Discharge Instructions (Signed)
Natalizumab injection What is this medicine? NATALIZUMAB (na ta LIZ you mab) is used to treat relapsing multiple sclerosis. This drug is not a cure. It is also used to treat Crohn's disease. This medicine may be used for other purposes; ask your health care provider or pharmacist if you have questions. COMMON BRAND NAME(S): Tysabri What should I tell my health care provider before I take this medicine? They need to know if you have any of these conditions:  immune system problems  progressive multifocal leukoencephalopathy (PML)  an unusual or allergic reaction to natalizumab, other medicines, foods, dyes, or preservatives  pregnant or trying to get pregnant  breast-feeding How should I use this medicine? This medicine is for infusion into a vein. It is given by a health care professional in a hospital or clinic setting. A special MedGuide will be given to you by the pharmacist with each prescription and refill. Be sure to read this information carefully each time. Talk to your pediatrician regarding the use of this medicine in children. This medicine is not approved for use in children. Overdosage: If you think you have taken too much of this medicine contact a poison control center or emergency room at once. NOTE: This medicine is only for you. Do not share this medicine with others. What if I miss a dose? It is important not to miss your dose. Call your doctor or health care professional if you are unable to keep an appointment. What may interact with this medicine? Do not take this medicine with any of the following medications:  biologic medicines such as adalimumab, certolizumab, etanercept, golimumab, infliximab This medicine may also interact with the following medications:  azathioprine  cyclosporine  interferons  6-mercaptopurine  methotrexate  other medicines that lower your chance of fighting an infection  steroid medicines like prednisone or  cortisone  vaccines This list may not describe all possible interactions. Give your health care provider a list of all the medicines, herbs, non-prescription drugs, or dietary supplements you use. Also tell them if you smoke, drink alcohol, or use illegal drugs. Some items may interact with your medicine. What should I watch for while using this medicine? Your condition will be monitored carefully while you are receiving this medicine. Visit your doctor for regular check ups. Tell your doctor or healthcare professional if your symptoms do not start to get better or if they get worse. Stay away from people who are sick. Call your doctor or health care professional for advice if you get a fever, chills or sore throat, or other symptoms of a cold or flu. Do not treat yourself. In some patients, this medicine may cause a serious brain infection that may cause death. If you have any problems seeing, thinking, speaking, walking, or standing, tell your doctor right away. If you cannot reach your doctor, get urgent medical care. What side effects may I notice from receiving this medicine? Side effects that you should report to your doctor or health care professional as soon as possible:  allergic reactions like skin rash, itching or hives, swelling of the face, lips, or tongue  breathing problems  changes in vision  chest pain  confusion  depressed mood  dizziness  feeling faint; lightheaded; falls  general ill feeling or flu-like symptoms  loss of memory  missed menstrual periods  muscle weakness  problems with balance, talking, or walking  signs and symptoms of liver injury like dark yellow or brown urine; general ill feeling or flu-like symptoms; light-colored  stools; loss of appetite; nausea; right upper belly pain; unusually weak or tired; yellowing of the eyes or skin  suicidal thoughts, mood changes  unusual bruising or bleeding  unusually weak or tired Side effects that  usually do not require medical attention (report to your doctor or health care professional if they continue or are bothersome):  headache  joint pain  muscle cramps  muscle pain  nausea, vomiting  pain, redness, or irritation at site where injected  tiredness This list may not describe all possible side effects. Call your doctor for medical advice about side effects. You may report side effects to FDA at 1-800-FDA-1088. Where should I keep my medicine? This drug is given in a hospital or clinic and will not be stored at home. NOTE: This sheet is a summary. It may not cover all possible information. If you have questions about this medicine, talk to your doctor, pharmacist, or health care provider.  2020 Elsevier/Gold Standard (2018-06-27 13:20:26)  

## 2019-03-16 ENCOUNTER — Encounter: Payer: Self-pay | Admitting: Neurology

## 2019-03-17 ENCOUNTER — Telehealth (INDEPENDENT_AMBULATORY_CARE_PROVIDER_SITE_OTHER): Payer: Managed Care, Other (non HMO) | Admitting: Neurology

## 2019-03-17 ENCOUNTER — Other Ambulatory Visit: Payer: Self-pay

## 2019-03-17 VITALS — Ht 62.0 in | Wt 210.0 lb

## 2019-03-17 DIAGNOSIS — G35 Multiple sclerosis: Secondary | ICD-10-CM | POA: Diagnosis not present

## 2019-03-17 DIAGNOSIS — Z79899 Other long term (current) drug therapy: Secondary | ICD-10-CM | POA: Diagnosis not present

## 2019-03-17 NOTE — Progress Notes (Signed)
Orders placed.  Attempted to reach patient to schedule but no answer and unable to leave message.

## 2019-03-17 NOTE — Addendum Note (Signed)
Addended by: Amado Coe on: 03/17/2019 11:19 AM   Modules accepted: Orders

## 2019-03-17 NOTE — Progress Notes (Signed)
Mychart message sent to patient regarding appointment and directions.

## 2019-03-17 NOTE — Progress Notes (Signed)
   Virtual Visit via Video Note The purpose of this virtual visit is to provide medical care while limiting exposure to the novel coronavirus.    Consent was obtained for video visit:  Yes.   Answered questions that patient had about telehealth interaction:  Yes.   I discussed the limitations, risks, security and privacy concerns of performing an evaluation and management service by telemedicine. I also discussed with the patient that there may be a patient responsible charge related to this service. The patient expressed understanding and agreed to proceed.  Pt location: Home Physician Location: office Name of referring provider:  Leeroy Cha,* I connected with Peggy Banks at patients initiation/request on 03/17/2019 at  8:30 AM EST by video enabled telemedicine application and verified that I am speaking with the correct person using two identifiers. Pt MRN:  [id number] Pt DOB:  10/01/1980 Video Participants:  Peggy Banks   History of Present Illness: This is a 39 y.o. female returning for follow-up of relapsing-remitting multiple sclerosis.  She is doing well from a MS-standpoint and denies any new vision changes, numbness, tingling, weakness, or falls.  There was a lot of confusion that her insurance would not continue Tysabri infusion at the hospital and she was going to have to transfer to out-patient facility, but ultimately it was settled and she is still able to receive infusion at the [facility] out-patient center, but her medication needs to be ordered through speciality pharmacy, not [facility].   Observations/Objective:   Vitals:   03/16/19 1021  Weight: 210 lb (95.3 kg)  Height: 5\' 2"  (1.575 m)   Patient is awake, alert, and appears comfortable.  Oriented x 4.   Extraocular muscles are intact. No ptosis.  Face is symmetric.  Speech is not dysarthric. Antigravity in all extremities. Gait with mild hyperextension of the knee, stable.  Unassisted.   Assessment and Plan:  Relapsing remitting multiple sclerosis (sx manifested in 2005, diagnosed in 2018).  She had disease burden involving juxtacortical, periventricular, brainstem, cerebellum, as well as short segment of the cervical and thoracic spine.  She was started on Tysabri in April 2018 and has been tolerating this well, with no disease progression.  JCV titers are negative.  Clinically, she is stable.  - Continue monthly Tysabri infusions - Continue baclofen 10mg  TID - MRI brain and cervical spine wwo contrast - Check CBC, CMP, and JCV antibody every 6 months - Continue vitamin D 5000 units   Follow Up Instructions:   I discussed the assessment and treatment plan with the patient. The patient was provided an opportunity to ask questions and all were answered. The patient agreed with the plan and demonstrated an understanding of the instructions.   The patient was advised to call back or seek an in-person evaluation if the symptoms worsen or if the condition fails to improve as anticipated.  Follow-up in 1 year   Alda Berthold, DO

## 2019-03-20 ENCOUNTER — Other Ambulatory Visit: Payer: Managed Care, Other (non HMO)

## 2019-03-20 ENCOUNTER — Other Ambulatory Visit: Payer: Self-pay

## 2019-03-20 DIAGNOSIS — G35 Multiple sclerosis: Secondary | ICD-10-CM

## 2019-03-20 DIAGNOSIS — Z79899 Other long term (current) drug therapy: Secondary | ICD-10-CM

## 2019-03-21 LAB — CBC WITH DIFFERENTIAL/PLATELET
Absolute Monocytes: 418 cells/uL (ref 200–950)
Basophils Absolute: 29 cells/uL (ref 0–200)
Basophils Relative: 0.5 %
Eosinophils Absolute: 58 cells/uL (ref 15–500)
Eosinophils Relative: 1 %
HCT: 33.4 % — ABNORMAL LOW (ref 35.0–45.0)
Hemoglobin: 10.6 g/dL — ABNORMAL LOW (ref 11.7–15.5)
Lymphs Abs: 2732 cells/uL (ref 850–3900)
MCH: 26.9 pg — ABNORMAL LOW (ref 27.0–33.0)
MCHC: 31.7 g/dL — ABNORMAL LOW (ref 32.0–36.0)
MCV: 84.8 fL (ref 80.0–100.0)
MPV: 8.8 fL (ref 7.5–12.5)
Monocytes Relative: 7.2 %
Neutro Abs: 2564 cells/uL (ref 1500–7800)
Neutrophils Relative %: 44.2 %
Platelets: 342 10*3/uL (ref 140–400)
RBC: 3.94 10*6/uL (ref 3.80–5.10)
RDW: 13.9 % (ref 11.0–15.0)
Total Lymphocyte: 47.1 %
WBC: 5.8 10*3/uL (ref 3.8–10.8)

## 2019-03-21 LAB — COMPREHENSIVE METABOLIC PANEL
AG Ratio: 1.4 (calc) (ref 1.0–2.5)
ALT: 13 U/L (ref 6–29)
AST: 13 U/L (ref 10–30)
Albumin: 4 g/dL (ref 3.6–5.1)
Alkaline phosphatase (APISO): 42 U/L (ref 31–125)
BUN: 9 mg/dL (ref 7–25)
CO2: 28 mmol/L (ref 20–32)
Calcium: 9.2 mg/dL (ref 8.6–10.2)
Chloride: 102 mmol/L (ref 98–110)
Creat: 0.66 mg/dL (ref 0.50–1.10)
Globulin: 2.9 g/dL (calc) (ref 1.9–3.7)
Glucose, Bld: 133 mg/dL — ABNORMAL HIGH (ref 65–99)
Potassium: 4.3 mmol/L (ref 3.5–5.3)
Sodium: 138 mmol/L (ref 135–146)
Total Bilirubin: 0.3 mg/dL (ref 0.2–1.2)
Total Protein: 6.9 g/dL (ref 6.1–8.1)

## 2019-03-27 LAB — STRATIFY JCV AB (W/ INDEX) W/ RFLX
Index Value: 0.24 — ABNORMAL HIGH
Stratify JCV (TM) Ab w/Reflex Inhibition: UNDETERMINED — AB

## 2019-03-27 LAB — RFLX STRATIFY JCV (TM) AB INHIBITION: JCV Antibody by Inhibition: NEGATIVE

## 2019-04-04 ENCOUNTER — Encounter: Payer: Self-pay | Admitting: *Deleted

## 2019-04-04 NOTE — Progress Notes (Signed)
Called [facility] program [phone] spoke with Rollene Fare she said they reived the updated information yesterday to Estalene is ready to be infused and I let them know she is scheduled for 04/06/2019. She stated separate from insurance authorization the Touch program requires a physician authorization every 6 months. This is to make sure the patient is compliant with labwork and the like.

## 2019-04-04 NOTE — Progress Notes (Signed)
Received letter from [facility], it was sent to scan into her chart Auth: [id number] Auth effective07/01/2019-07/05/2020 Total approved: [id number]

## 2019-04-06 ENCOUNTER — Ambulatory Visit (HOSPITAL_COMMUNITY): Payer: Managed Care, Other (non HMO)

## 2019-04-19 ENCOUNTER — Other Ambulatory Visit: Payer: Self-pay

## 2019-04-19 ENCOUNTER — Ambulatory Visit (HOSPITAL_COMMUNITY)
Admission: RE | Admit: 2019-04-19 | Discharge: 2019-04-19 | Disposition: A | Discharge status: Home / Self Care | Payer: Managed Care, Other (non HMO) | Source: Ambulatory Visit | Attending: Neurology | Admitting: Neurology

## 2019-04-19 DIAGNOSIS — G35 Multiple sclerosis: Secondary | ICD-10-CM | POA: Diagnosis present

## 2019-04-19 MED ORDER — SODIUM CHLORIDE 0.9 % IV SOLN
300.0000 mg | INTRAVENOUS | Status: DC
  Administered 2019-04-19: 09:00:00 300 mg via INTRAVENOUS
  Filled 2019-04-19: qty 15

## 2019-04-19 MED ORDER — ACETAMINOPHEN 325 MG PO TABS
650.0000 mg | ORAL_TABLET | Freq: Once | ORAL | Status: AC
  Administered 2019-04-19: 650 mg via ORAL
  Filled 2019-04-19: qty 2

## 2019-04-19 MED ORDER — LORATADINE 10 MG PO TABS
10.0000 mg | ORAL_TABLET | Freq: Every day | ORAL | Status: DC
  Administered 2019-04-19: 10 mg via ORAL
  Filled 2019-04-19: qty 1

## 2019-04-19 MED ORDER — SODIUM CHLORIDE 0.9 % IV SOLN
INTRAVENOUS | Status: DC | PRN
  Administered 2019-04-19: 09:00:00 250 mL via INTRAVENOUS

## 2019-04-19 NOTE — Progress Notes (Signed)
Patient Care Center Note   Diagnosis:Multiple Sclerosis   Provider:Patel, Donika, DO   Procedure:IV Tysabri   Note:Received pre-medications and IV Tysabri infusion via PIV. Tolerated well. Patient declined one hour post-transfusion observation. Vitalsignsstable. Discharge instructions given. Patient alert, oriented and ambulatory at discharge. 

## 2019-04-19 NOTE — Discharge Instructions (Signed)
Natalizumab injection What is this medicine? NATALIZUMAB (na ta LIZ you mab) is used to treat relapsing multiple sclerosis. This drug is not a cure. It is also used to treat Crohn's disease. This medicine may be used for other purposes; ask your health care provider or pharmacist if you have questions. COMMON BRAND NAME(S): Tysabri What should I tell my health care provider before I take this medicine? They need to know if you have any of these conditions:  immune system problems  progressive multifocal leukoencephalopathy (PML)  an unusual or allergic reaction to natalizumab, other medicines, foods, dyes, or preservatives  pregnant or trying to get pregnant  breast-feeding How should I use this medicine? This medicine is for infusion into a vein. It is given by a health care professional in a hospital or clinic setting. A special MedGuide will be given to you by the pharmacist with each prescription and refill. Be sure to read this information carefully each time. Talk to your pediatrician regarding the use of this medicine in children. This medicine is not approved for use in children. Overdosage: If you think you have taken too much of this medicine contact a poison control center or emergency room at once. NOTE: This medicine is only for you. Do not share this medicine with others. What if I miss a dose? It is important not to miss your dose. Call your doctor or health care professional if you are unable to keep an appointment. What may interact with this medicine? Do not take this medicine with any of the following medications:  biologic medicines such as adalimumab, certolizumab, etanercept, golimumab, infliximab This medicine may also interact with the following medications:  azathioprine  cyclosporine  interferons  6-mercaptopurine  methotrexate  other medicines that lower your chance of fighting an infection  steroid medicines like prednisone or  cortisone  vaccines This list may not describe all possible interactions. Give your health care provider a list of all the medicines, herbs, non-prescription drugs, or dietary supplements you use. Also tell them if you smoke, drink alcohol, or use illegal drugs. Some items may interact with your medicine. What should I watch for while using this medicine? Your condition will be monitored carefully while you are receiving this medicine. Visit your doctor for regular check ups. Tell your doctor or healthcare professional if your symptoms do not start to get better or if they get worse. Stay away from people who are sick. Call your doctor or health care professional for advice if you get a fever, chills or sore throat, or other symptoms of a cold or flu. Do not treat yourself. In some patients, this medicine may cause a serious brain infection that may cause death. If you have any problems seeing, thinking, speaking, walking, or standing, tell your doctor right away. If you cannot reach your doctor, get urgent medical care. What side effects may I notice from receiving this medicine? Side effects that you should report to your doctor or health care professional as soon as possible:  allergic reactions like skin rash, itching or hives, swelling of the face, lips, or tongue  breathing problems  changes in vision  chest pain  confusion  depressed mood  dizziness  feeling faint; lightheaded; falls  general ill feeling or flu-like symptoms  loss of memory  missed menstrual periods  muscle weakness  problems with balance, talking, or walking  signs and symptoms of liver injury like dark yellow or brown urine; general ill feeling or flu-like symptoms; light-colored  stools; loss of appetite; nausea; right upper belly pain; unusually weak or tired; yellowing of the eyes or skin  suicidal thoughts, mood changes  unusual bruising or bleeding  unusually weak or tired Side effects that  usually do not require medical attention (report to your doctor or health care professional if they continue or are bothersome):  headache  joint pain  muscle cramps  muscle pain  nausea, vomiting  pain, redness, or irritation at site where injected  tiredness This list may not describe all possible side effects. Call your doctor for medical advice about side effects. You may report side effects to FDA at 1-800-FDA-1088. Where should I keep my medicine? This drug is given in a hospital or clinic and will not be stored at home. NOTE: This sheet is a summary. It may not cover all possible information. If you have questions about this medicine, talk to your doctor, pharmacist, or health care provider.  2020 Elsevier/Gold Standard (2018-06-27 13:20:26)  

## 2019-05-19 ENCOUNTER — Other Ambulatory Visit: Payer: Self-pay

## 2019-05-19 ENCOUNTER — Ambulatory Visit (HOSPITAL_COMMUNITY)
Admission: RE | Admit: 2019-05-19 | Discharge: 2019-05-19 | Disposition: A | Discharge status: Home / Self Care | Payer: Managed Care, Other (non HMO) | Source: Ambulatory Visit | Attending: Neurology | Admitting: Neurology

## 2019-05-19 DIAGNOSIS — Z0279 Encounter for issue of other medical certificate: Secondary | ICD-10-CM

## 2019-05-19 DIAGNOSIS — G35 Multiple sclerosis: Secondary | ICD-10-CM | POA: Insufficient documentation

## 2019-05-19 MED ORDER — SODIUM CHLORIDE 0.9 % IV SOLN
300.0000 mg | INTRAVENOUS | Status: DC
  Administered 2019-05-19: 300 mg via INTRAVENOUS
  Filled 2019-05-19: qty 15

## 2019-05-19 MED ORDER — ACETAMINOPHEN 325 MG PO TABS
650.0000 mg | ORAL_TABLET | Freq: Once | ORAL | Status: AC
  Administered 2019-05-19: 650 mg via ORAL
  Filled 2019-05-19: qty 2

## 2019-05-19 MED ORDER — SODIUM CHLORIDE 0.9 % IV SOLN
INTRAVENOUS | Status: DC | PRN
  Administered 2019-05-19: 250 mL via INTRAVENOUS

## 2019-05-19 MED ORDER — LORATADINE 10 MG PO TABS
10.0000 mg | ORAL_TABLET | Freq: Every day | ORAL | Status: DC
  Administered 2019-05-19: 10 mg via ORAL
  Filled 2019-05-19: qty 1

## 2019-05-19 NOTE — Discharge Instructions (Signed)
Natalizumab injection What is this medicine? NATALIZUMAB (na ta LIZ you mab) is used to treat relapsing multiple sclerosis. This drug is not a cure. It is also used to treat Crohn's disease. This medicine may be used for other purposes; ask your health care provider or pharmacist if you have questions. COMMON BRAND NAME(S): Tysabri What should I tell my health care provider before I take this medicine? They need to know if you have any of these conditions:  immune system problems  progressive multifocal leukoencephalopathy (PML)  an unusual or allergic reaction to natalizumab, other medicines, foods, dyes, or preservatives  pregnant or trying to get pregnant  breast-feeding How should I use this medicine? This medicine is for infusion into a vein. It is given by a health care professional in a hospital or clinic setting. A special MedGuide will be given to you by the pharmacist with each prescription and refill. Be sure to read this information carefully each time. Talk to your pediatrician regarding the use of this medicine in children. This medicine is not approved for use in children. Overdosage: If you think you have taken too much of this medicine contact a poison control center or emergency room at once. NOTE: This medicine is only for you. Do not share this medicine with others. What if I miss a dose? It is important not to miss your dose. Call your doctor or health care professional if you are unable to keep an appointment. What may interact with this medicine? Do not take this medicine with any of the following medications:  biologic medicines such as adalimumab, certolizumab, etanercept, golimumab, infliximab This medicine may also interact with the following medications:  azathioprine  cyclosporine  interferons  6-mercaptopurine  methotrexate  other medicines that lower your chance of fighting an infection  steroid medicines like prednisone or  cortisone  vaccines This list may not describe all possible interactions. Give your health care provider a list of all the medicines, herbs, non-prescription drugs, or dietary supplements you use. Also tell them if you smoke, drink alcohol, or use illegal drugs. Some items may interact with your medicine. What should I watch for while using this medicine? Your condition will be monitored carefully while you are receiving this medicine. Visit your doctor for regular check ups. Tell your doctor or healthcare professional if your symptoms do not start to get better or if they get worse. Stay away from people who are sick. Call your doctor or health care professional for advice if you get a fever, chills or sore throat, or other symptoms of a cold or flu. Do not treat yourself. In some patients, this medicine may cause a serious brain infection that may cause death. If you have any problems seeing, thinking, speaking, walking, or standing, tell your doctor right away. If you cannot reach your doctor, get urgent medical care. What side effects may I notice from receiving this medicine? Side effects that you should report to your doctor or health care professional as soon as possible:  allergic reactions like skin rash, itching or hives, swelling of the face, lips, or tongue  breathing problems  changes in vision  chest pain  confusion  depressed mood  dizziness  feeling faint; lightheaded; falls  general ill feeling or flu-like symptoms  loss of memory  missed menstrual periods  muscle weakness  problems with balance, talking, or walking  signs and symptoms of liver injury like dark yellow or brown urine; general ill feeling or flu-like symptoms; light-colored  stools; loss of appetite; nausea; right upper belly pain; unusually weak or tired; yellowing of the eyes or skin  suicidal thoughts, mood changes  unusual bruising or bleeding  unusually weak or tired Side effects that  usually do not require medical attention (report to your doctor or health care professional if they continue or are bothersome):  headache  joint pain  muscle cramps  muscle pain  nausea, vomiting  pain, redness, or irritation at site where injected  tiredness This list may not describe all possible side effects. Call your doctor for medical advice about side effects. You may report side effects to FDA at 1-800-FDA-1088. Where should I keep my medicine? This drug is given in a hospital or clinic and will not be stored at home. NOTE: This sheet is a summary. It may not cover all possible information. If you have questions about this medicine, talk to your doctor, pharmacist, or health care provider.  2020 Elsevier/Gold Standard (2018-06-27 13:20:26)  

## 2019-05-19 NOTE — Progress Notes (Signed)
Patient Care Center Note   Diagnosis:Multiple Sclerosis   Provider:Patel, Donika, DO   Procedure:IV Tysabri   Note:Received pre-medications and IV Tysabri infusion via PIV. Tolerated well. Patient declined one hour post-transfusion observation. Vitalsignsstable. Discharge instructions given. Patient alert, oriented and ambulatory at discharge. 

## 2019-06-12 ENCOUNTER — Telehealth: Payer: Self-pay | Admitting: Neurology

## 2019-06-12 NOTE — Telephone Encounter (Signed)
Pt called is bringing papers tomorrow for signature

## 2019-06-12 NOTE — Telephone Encounter (Signed)
Patient called and left a message stating her recent FMLA forms are incomplete. She'd like a call back about this please.

## 2019-06-14 NOTE — Telephone Encounter (Signed)
Patient brought paperwork back in to have parts C & D filled out. She will pick up the paperwork once it is complete.

## 2019-06-16 ENCOUNTER — Other Ambulatory Visit: Payer: Self-pay

## 2019-06-16 ENCOUNTER — Non-Acute Institutional Stay (HOSPITAL_COMMUNITY)
Admission: RE | Admit: 2019-06-16 | Discharge: 2019-06-16 | Disposition: A | Discharge status: Home / Self Care | Payer: Managed Care, Other (non HMO) | Source: Ambulatory Visit | Attending: Internal Medicine | Admitting: Internal Medicine

## 2019-06-16 DIAGNOSIS — G35 Multiple sclerosis: Secondary | ICD-10-CM | POA: Diagnosis present

## 2019-06-16 MED ORDER — SODIUM CHLORIDE 0.9 % IV SOLN
300.0000 mg | INTRAVENOUS | Status: DC
  Administered 2019-06-16: 300 mg via INTRAVENOUS
  Filled 2019-06-16: qty 15

## 2019-06-16 MED ORDER — LORATADINE 10 MG PO TABS
10.0000 mg | ORAL_TABLET | Freq: Every day | ORAL | Status: DC
  Administered 2019-06-16: 10 mg via ORAL
  Filled 2019-06-16: qty 1

## 2019-06-16 MED ORDER — SODIUM CHLORIDE 0.9 % IV SOLN
INTRAVENOUS | Status: DC | PRN
  Administered 2019-06-16: 250 mL via INTRAVENOUS

## 2019-06-16 MED ORDER — ACETAMINOPHEN 325 MG PO TABS
650.0000 mg | ORAL_TABLET | Freq: Once | ORAL | Status: AC
  Administered 2019-06-16: 650 mg via ORAL
  Filled 2019-06-16: qty 2

## 2019-06-16 NOTE — Discharge Instructions (Signed)
Natalizumab injection What is this medicine? NATALIZUMAB (na ta LIZ you mab) is used to treat relapsing multiple sclerosis. This drug is not a cure. It is also used to treat Crohn's disease. This medicine may be used for other purposes; ask your health care provider or pharmacist if you have questions. COMMON BRAND NAME(S): Tysabri What should I tell my health care provider before I take this medicine? They need to know if you have any of these conditions:  immune system problems  progressive multifocal leukoencephalopathy (PML)  an unusual or allergic reaction to natalizumab, other medicines, foods, dyes, or preservatives  pregnant or trying to get pregnant  breast-feeding How should I use this medicine? This medicine is for infusion into a vein. It is given by a health care professional in a hospital or clinic setting. A special MedGuide will be given to you by the pharmacist with each prescription and refill. Be sure to read this information carefully each time. Talk to your pediatrician regarding the use of this medicine in children. This medicine is not approved for use in children. Overdosage: If you think you have taken too much of this medicine contact a poison control center or emergency room at once. NOTE: This medicine is only for you. Do not share this medicine with others. What if I miss a dose? It is important not to miss your dose. Call your doctor or health care professional if you are unable to keep an appointment. What may interact with this medicine? Do not take this medicine with any of the following medications:  biologic medicines such as adalimumab, certolizumab, etanercept, golimumab, infliximab This medicine may also interact with the following medications:  azathioprine  cyclosporine  interferons  6-mercaptopurine  methotrexate  other medicines that lower your chance of fighting an infection  steroid medicines like prednisone or  cortisone  vaccines This list may not describe all possible interactions. Give your health care provider a list of all the medicines, herbs, non-prescription drugs, or dietary supplements you use. Also tell them if you smoke, drink alcohol, or use illegal drugs. Some items may interact with your medicine. What should I watch for while using this medicine? Your condition will be monitored carefully while you are receiving this medicine. Visit your doctor for regular check ups. Tell your doctor or healthcare professional if your symptoms do not start to get better or if they get worse. Stay away from people who are sick. Call your doctor or health care professional for advice if you get a fever, chills or sore throat, or other symptoms of a cold or flu. Do not treat yourself. In some patients, this medicine may cause a serious brain infection that may cause death. If you have any problems seeing, thinking, speaking, walking, or standing, tell your doctor right away. If you cannot reach your doctor, get urgent medical care. What side effects may I notice from receiving this medicine? Side effects that you should report to your doctor or health care professional as soon as possible:  allergic reactions like skin rash, itching or hives, swelling of the face, lips, or tongue  breathing problems  changes in vision  chest pain  confusion  depressed mood  dizziness  feeling faint; lightheaded; falls  general ill feeling or flu-like symptoms  loss of memory  missed menstrual periods  muscle weakness  problems with balance, talking, or walking  signs and symptoms of liver injury like dark yellow or brown urine; general ill feeling or flu-like symptoms; light-colored  stools; loss of appetite; nausea; right upper belly pain; unusually weak or tired; yellowing of the eyes or skin  suicidal thoughts, mood changes  unusual bruising or bleeding  unusually weak or tired Side effects that  usually do not require medical attention (report to your doctor or health care professional if they continue or are bothersome):  headache  joint pain  muscle cramps  muscle pain  nausea, vomiting  pain, redness, or irritation at site where injected  tiredness This list may not describe all possible side effects. Call your doctor for medical advice about side effects. You may report side effects to FDA at 1-800-FDA-1088. Where should I keep my medicine? This drug is given in a hospital or clinic and will not be stored at home. NOTE: This sheet is a summary. It may not cover all possible information. If you have questions about this medicine, talk to your doctor, pharmacist, or health care provider.  2020 Elsevier/Gold Standard (2018-06-27 13:20:26)  

## 2019-06-16 NOTE — Progress Notes (Signed)
Patient Care Center Note   Diagnosis:Multiple Sclerosis   Provider:Patel, Donika, DO   Procedure:IV Tysabri   Note:Received pre-medications and IV Tysabri infusion via PIV. Tolerated well. Patient declined one hour post-transfusion observation. Vitalsignsstable. Discharge instructions given. Patient alert, oriented and ambulatory at discharge. 

## 2019-07-13 ENCOUNTER — Telehealth: Payer: Self-pay | Admitting: Neurology

## 2019-07-13 NOTE — Telephone Encounter (Signed)
The [facility] center called in for this patient. They have an appointment for 07/14/19 at 8:30 for this patient to get Tysabri, but they do not have an order for it. If this patient still needs this medication please fax an order over to [phone].

## 2019-07-14 ENCOUNTER — Ambulatory Visit (HOSPITAL_COMMUNITY)
Admission: RE | Admit: 2019-07-14 | Discharge: 2019-07-14 | Disposition: A | Discharge status: Home / Self Care | Payer: Managed Care, Other (non HMO) | Source: Ambulatory Visit | Attending: Neurology | Admitting: Neurology

## 2019-07-14 ENCOUNTER — Other Ambulatory Visit: Payer: Self-pay

## 2019-07-14 DIAGNOSIS — G35 Multiple sclerosis: Secondary | ICD-10-CM | POA: Insufficient documentation

## 2019-07-14 MED ORDER — SODIUM CHLORIDE 0.9 % IV SOLN: 300.0000 mg | Freq: Once | INTRAVENOUS | Status: DC

## 2019-07-14 MED ORDER — ACETAMINOPHEN 325 MG PO TABS: 650.0000 mg | ORAL_TABLET | Freq: Once | ORAL | Status: DC

## 2019-07-14 MED ORDER — SODIUM CHLORIDE 0.9 % IV SOLN
INTRAVENOUS | Status: DC | PRN
  Administered 2019-07-14: 250 mL via INTRAVENOUS

## 2019-07-14 MED ORDER — ACETAMINOPHEN 325 MG PO TABS
650.0000 mg | ORAL_TABLET | Freq: Once | ORAL | Status: AC
  Administered 2019-07-14: 650 mg via ORAL
  Filled 2019-07-14: qty 2

## 2019-07-14 MED ORDER — LORATADINE 10 MG PO TABS: 10.0000 mg | ORAL_TABLET | Freq: Once | ORAL | Status: DC

## 2019-07-14 MED ORDER — SODIUM CHLORIDE 0.9 % IV SOLN: 300.0000 mg | INTRAVENOUS | Status: AC

## 2019-07-14 MED ORDER — SODIUM CHLORIDE 0.9 % IV SOLN
300.0000 mg | INTRAVENOUS | Status: DC
  Administered 2019-07-14: 300 mg via INTRAVENOUS
  Filled 2019-07-14: qty 15

## 2019-07-14 MED ORDER — LORATADINE 10 MG PO TABS
10.0000 mg | ORAL_TABLET | Freq: Every day | ORAL | Status: DC
  Administered 2019-07-14: 10 mg via ORAL
  Filled 2019-07-14: qty 1

## 2019-07-14 NOTE — Discharge Instructions (Signed)
Natalizumab injection What is this medicine? NATALIZUMAB (na ta LIZ you mab) is used to treat relapsing multiple sclerosis. This drug is not a cure. It is also used to treat Crohn's disease. This medicine may be used for other purposes; ask your health care provider or pharmacist if you have questions. COMMON BRAND NAME(S): Tysabri What should I tell my health care provider before I take this medicine? They need to know if you have any of these conditions:  immune system problems  progressive multifocal leukoencephalopathy (PML)  an unusual or allergic reaction to natalizumab, other medicines, foods, dyes, or preservatives  pregnant or trying to get pregnant  breast-feeding How should I use this medicine? This medicine is for infusion into a vein. It is given by a health care professional in a hospital or clinic setting. A special MedGuide will be given to you by the pharmacist with each prescription and refill. Be sure to read this information carefully each time. Talk to your pediatrician regarding the use of this medicine in children. This medicine is not approved for use in children. Overdosage: If you think you have taken too much of this medicine contact a poison control center or emergency room at once. NOTE: This medicine is only for you. Do not share this medicine with others. What if I miss a dose? It is important not to miss your dose. Call your doctor or health care professional if you are unable to keep an appointment. What may interact with this medicine? Do not take this medicine with any of the following medications:  biologic medicines such as adalimumab, certolizumab, etanercept, golimumab, infliximab This medicine may also interact with the following medications:  azathioprine  cyclosporine  interferons  6-mercaptopurine  methotrexate  other medicines that lower your chance of fighting an infection  steroid medicines like prednisone or  cortisone  vaccines This list may not describe all possible interactions. Give your health care provider a list of all the medicines, herbs, non-prescription drugs, or dietary supplements you use. Also tell them if you smoke, drink alcohol, or use illegal drugs. Some items may interact with your medicine. What should I watch for while using this medicine? Your condition will be monitored carefully while you are receiving this medicine. Visit your doctor for regular check ups. Tell your doctor or healthcare professional if your symptoms do not start to get better or if they get worse. Stay away from people who are sick. Call your doctor or health care professional for advice if you get a fever, chills or sore throat, or other symptoms of a cold or flu. Do not treat yourself. In some patients, this medicine may cause a serious brain infection that may cause death. If you have any problems seeing, thinking, speaking, walking, or standing, tell your doctor right away. If you cannot reach your doctor, get urgent medical care. What side effects may I notice from receiving this medicine? Side effects that you should report to your doctor or health care professional as soon as possible:  allergic reactions like skin rash, itching or hives, swelling of the face, lips, or tongue  breathing problems  changes in vision  chest pain  confusion  depressed mood  dizziness  feeling faint; lightheaded; falls  general ill feeling or flu-like symptoms  loss of memory  missed menstrual periods  muscle weakness  problems with balance, talking, or walking  signs and symptoms of liver injury like dark yellow or brown urine; general ill feeling or flu-like symptoms; light-colored  stools; loss of appetite; nausea; right upper belly pain; unusually weak or tired; yellowing of the eyes or skin  suicidal thoughts, mood changes  unusual bruising or bleeding  unusually weak or tired Side effects that  usually do not require medical attention (report to your doctor or health care professional if they continue or are bothersome):  headache  joint pain  muscle cramps  muscle pain  nausea, vomiting  pain, redness, or irritation at site where injected  tiredness This list may not describe all possible side effects. Call your doctor for medical advice about side effects. You may report side effects to FDA at 1-800-FDA-1088. Where should I keep my medicine? This drug is given in a hospital or clinic and will not be stored at home. NOTE: This sheet is a summary. It may not cover all possible information. If you have questions about this medicine, talk to your doctor, pharmacist, or health care provider.  2020 Elsevier/Gold Standard (2018-06-27 13:20:26)  

## 2019-07-14 NOTE — Progress Notes (Signed)
New orders entered for Tysabri infusions.

## 2019-07-14 NOTE — Progress Notes (Signed)
PATIENT CARE CENTER NOTE  Diagnosis: Multiple sclerosis   Provider: Narda Amber DO   Procedure: IV Tysabri   Note: Patient received IV Tysabri. Pre medications were given as ordered. Patient declined the 60 minutes post infusion observation. Tolerated well, vitals stable, discharge instructions given, verbalized understanding. Patient alert, oriented and ambulatory at the time of discharge.

## 2019-08-10 ENCOUNTER — Other Ambulatory Visit: Payer: Self-pay

## 2019-08-10 DIAGNOSIS — G35 Multiple sclerosis: Secondary | ICD-10-CM

## 2019-08-10 MED ORDER — SODIUM CHLORIDE 0.9 % IV SOLN: 300.0000 mg | INTRAVENOUS | Status: DC

## 2019-08-10 MED ORDER — ACETAMINOPHEN 325 MG PO TABS: 650.0000 mg | ORAL_TABLET | Freq: Once | ORAL | Status: DC

## 2019-08-10 MED ORDER — LORATADINE 10 MG PO TABS: 10.0000 mg | ORAL_TABLET | Freq: Every day | ORAL | Status: DC

## 2019-08-11 ENCOUNTER — Other Ambulatory Visit: Payer: Self-pay

## 2019-08-11 ENCOUNTER — Ambulatory Visit (HOSPITAL_COMMUNITY)
Admission: RE | Admit: 2019-08-11 | Discharge: 2019-08-11 | Disposition: A | Discharge status: Home / Self Care | Payer: Managed Care, Other (non HMO) | Source: Ambulatory Visit | Attending: Neurology | Admitting: Neurology

## 2019-08-11 DIAGNOSIS — G35 Multiple sclerosis: Secondary | ICD-10-CM | POA: Insufficient documentation

## 2019-08-11 MED ORDER — LORATADINE 10 MG PO TABS
10.0000 mg | ORAL_TABLET | Freq: Once | ORAL | Status: AC
  Administered 2019-08-11: 10 mg via ORAL
  Filled 2019-08-11: qty 1

## 2019-08-11 MED ORDER — SODIUM CHLORIDE 0.9 % IV SOLN
300.0000 mg | Freq: Once | INTRAVENOUS | Status: AC
  Administered 2019-08-11: 300 mg via INTRAVENOUS
  Filled 2019-08-11: qty 15

## 2019-08-11 MED ORDER — SODIUM CHLORIDE 0.9 % IV SOLN
INTRAVENOUS | Status: DC | PRN
  Administered 2019-08-11: 250 mL via INTRAVENOUS

## 2019-08-11 MED ORDER — ACETAMINOPHEN 325 MG PO TABS
650.0000 mg | ORAL_TABLET | Freq: Once | ORAL | Status: AC
  Administered 2019-08-11: 650 mg via ORAL
  Filled 2019-08-11: qty 2

## 2019-08-11 NOTE — Discharge Instructions (Signed)
Natalizumab injection What is this medicine? NATALIZUMAB (na ta LIZ you mab) is used to treat relapsing multiple sclerosis. This drug is not a cure. It is also used to treat Crohn's disease. This medicine may be used for other purposes; ask your health care provider or pharmacist if you have questions. COMMON BRAND NAME(S): Tysabri What should I tell my health care provider before I take this medicine? They need to know if you have any of these conditions:  immune system problems  progressive multifocal leukoencephalopathy (PML)  an unusual or allergic reaction to natalizumab, other medicines, foods, dyes, or preservatives  pregnant or trying to get pregnant  breast-feeding How should I use this medicine? This medicine is for infusion into a vein. It is given by a health care professional in a hospital or clinic setting. A special MedGuide will be given to you by the pharmacist with each prescription and refill. Be sure to read this information carefully each time. Talk to your pediatrician regarding the use of this medicine in children. This medicine is not approved for use in children. Overdosage: If you think you have taken too much of this medicine contact a poison control center or emergency room at once. NOTE: This medicine is only for you. Do not share this medicine with others. What if I miss a dose? It is important not to miss your dose. Call your doctor or health care professional if you are unable to keep an appointment. What may interact with this medicine? Do not take this medicine with any of the following medications:  biologic medicines such as adalimumab, certolizumab, etanercept, golimumab, infliximab This medicine may also interact with the following medications:  azathioprine  cyclosporine  interferons  6-mercaptopurine  methotrexate  other medicines that lower your chance of fighting an infection  steroid medicines like prednisone or  cortisone  vaccines This list may not describe all possible interactions. Give your health care provider a list of all the medicines, herbs, non-prescription drugs, or dietary supplements you use. Also tell them if you smoke, drink alcohol, or use illegal drugs. Some items may interact with your medicine. What should I watch for while using this medicine? Your condition will be monitored carefully while you are receiving this medicine. Visit your doctor for regular check ups. Tell your doctor or healthcare professional if your symptoms do not start to get better or if they get worse. Stay away from people who are sick. Call your doctor or health care professional for advice if you get a fever, chills or sore throat, or other symptoms of a cold or flu. Do not treat yourself. In some patients, this medicine may cause a serious brain infection that may cause death. If you have any problems seeing, thinking, speaking, walking, or standing, tell your doctor right away. If you cannot reach your doctor, get urgent medical care. What side effects may I notice from receiving this medicine? Side effects that you should report to your doctor or health care professional as soon as possible:  allergic reactions like skin rash, itching or hives, swelling of the face, lips, or tongue  breathing problems  changes in vision  chest pain  confusion  depressed mood  dizziness  feeling faint; lightheaded; falls  general ill feeling or flu-like symptoms  loss of memory  missed menstrual periods  muscle weakness  problems with balance, talking, or walking  signs and symptoms of liver injury like dark yellow or brown urine; general ill feeling or flu-like symptoms; light-colored  stools; loss of appetite; nausea; right upper belly pain; unusually weak or tired; yellowing of the eyes or skin  suicidal thoughts, mood changes  unusual bruising or bleeding  unusually weak or tired Side effects that  usually do not require medical attention (report to your doctor or health care professional if they continue or are bothersome):  headache  joint pain  muscle cramps  muscle pain  nausea, vomiting  pain, redness, or irritation at site where injected  tiredness This list may not describe all possible side effects. Call your doctor for medical advice about side effects. You may report side effects to FDA at 1-800-FDA-1088. Where should I keep my medicine? This drug is given in a hospital or clinic and will not be stored at home. NOTE: This sheet is a summary. It may not cover all possible information. If you have questions about this medicine, talk to your doctor, pharmacist, or health care provider.  2020 Elsevier/Gold Standard (2018-06-27 13:20:26)  

## 2019-08-11 NOTE — Progress Notes (Signed)
PATIENT CARE CENTER NOTE  Diagnosis: Multiple sclerosis   Provider: Narda Amber DO   Procedure: IV Tysabri   Note: Patient received IV Tysabri. Pre medications were given as ordered. Patient declined the 60 minutes post infusion observation. Tolerated well, vitals stable, discharge instructions given, verbalized understanding. Patient alert, oriented and ambulatory at the time of discharge

## 2019-09-08 ENCOUNTER — Ambulatory Visit (HOSPITAL_COMMUNITY)
Admission: RE | Admit: 2019-09-08 | Discharge: 2019-09-08 | Disposition: A | Discharge status: Home / Self Care | Payer: Managed Care, Other (non HMO) | Source: Ambulatory Visit | Attending: Neurology | Admitting: Neurology

## 2019-09-08 ENCOUNTER — Other Ambulatory Visit: Payer: Self-pay

## 2019-09-08 DIAGNOSIS — G35 Multiple sclerosis: Secondary | ICD-10-CM | POA: Diagnosis not present

## 2019-09-08 MED ORDER — LORATADINE 10 MG PO TABS
10.0000 mg | ORAL_TABLET | Freq: Every day | ORAL | Status: DC
  Administered 2019-09-08: 10 mg via ORAL
  Filled 2019-09-08: qty 1

## 2019-09-08 MED ORDER — ACETAMINOPHEN 325 MG PO TABS
650.0000 mg | ORAL_TABLET | Freq: Once | ORAL | Status: AC
  Administered 2019-09-08: 650 mg via ORAL
  Filled 2019-09-08: qty 2

## 2019-09-08 MED ORDER — SODIUM CHLORIDE 0.9 % IV SOLN
INTRAVENOUS | Status: DC | PRN
  Administered 2019-09-08: 250 mL via INTRAVENOUS

## 2019-09-08 MED ORDER — SODIUM CHLORIDE 0.9 % IV SOLN
300.0000 mg | INTRAVENOUS | Status: DC
  Administered 2019-09-08: 300 mg via INTRAVENOUS
  Filled 2019-09-08: qty 15

## 2019-09-08 NOTE — Discharge Instructions (Signed)
Natalizumab injection What is this medicine? NATALIZUMAB (na ta LIZ you mab) is used to treat relapsing multiple sclerosis. This drug is not a cure. It is also used to treat Crohn's disease. This medicine may be used for other purposes; ask your health care provider or pharmacist if you have questions. COMMON BRAND NAME(S): Tysabri What should I tell my health care provider before I take this medicine? They need to know if you have any of these conditions:  immune system problems  progressive multifocal leukoencephalopathy (PML)  an unusual or allergic reaction to natalizumab, other medicines, foods, dyes, or preservatives  pregnant or trying to get pregnant  breast-feeding How should I use this medicine? This medicine is for infusion into a vein. It is given by a health care professional in a hospital or clinic setting. A special MedGuide will be given to you by the pharmacist with each prescription and refill. Be sure to read this information carefully each time. Talk to your pediatrician regarding the use of this medicine in children. This medicine is not approved for use in children. Overdosage: If you think you have taken too much of this medicine contact a poison control center or emergency room at once. NOTE: This medicine is only for you. Do not share this medicine with others. What if I miss a dose? It is important not to miss your dose. Call your doctor or health care professional if you are unable to keep an appointment. What may interact with this medicine? Do not take this medicine with any of the following medications:  biologic medicines such as adalimumab, certolizumab, etanercept, golimumab, infliximab This medicine may also interact with the following medications:  azathioprine  cyclosporine  interferons  6-mercaptopurine  methotrexate  other medicines that lower your chance of fighting an infection  steroid medicines like prednisone or  cortisone  vaccines This list may not describe all possible interactions. Give your health care provider a list of all the medicines, herbs, non-prescription drugs, or dietary supplements you use. Also tell them if you smoke, drink alcohol, or use illegal drugs. Some items may interact with your medicine. What should I watch for while using this medicine? Your condition will be monitored carefully while you are receiving this medicine. Visit your doctor for regular check ups. Tell your doctor or healthcare professional if your symptoms do not start to get better or if they get worse. Stay away from people who are sick. Call your doctor or health care professional for advice if you get a fever, chills or sore throat, or other symptoms of a cold or flu. Do not treat yourself. In some patients, this medicine may cause a serious brain infection that may cause death. If you have any problems seeing, thinking, speaking, walking, or standing, tell your doctor right away. If you cannot reach your doctor, get urgent medical care. What side effects may I notice from receiving this medicine? Side effects that you should report to your doctor or health care professional as soon as possible:  allergic reactions like skin rash, itching or hives, swelling of the face, lips, or tongue  breathing problems  changes in vision  chest pain  confusion  depressed mood  dizziness  feeling faint; lightheaded; falls  general ill feeling or flu-like symptoms  loss of memory  missed menstrual periods  muscle weakness  problems with balance, talking, or walking  signs and symptoms of liver injury like dark yellow or brown urine; general ill feeling or flu-like symptoms; light-colored  stools; loss of appetite; nausea; right upper belly pain; unusually weak or tired; yellowing of the eyes or skin  suicidal thoughts, mood changes  unusual bruising or bleeding  unusually weak or tired Side effects that  usually do not require medical attention (report to your doctor or health care professional if they continue or are bothersome):  headache  joint pain  muscle cramps  muscle pain  nausea, vomiting  pain, redness, or irritation at site where injected  tiredness This list may not describe all possible side effects. Call your doctor for medical advice about side effects. You may report side effects to FDA at 1-800-FDA-1088. Where should I keep my medicine? This drug is given in a hospital or clinic and will not be stored at home. NOTE: This sheet is a summary. It may not cover all possible information. If you have questions about this medicine, talk to your doctor, pharmacist, or health care provider.  2020 Elsevier/Gold Standard (2018-06-27 13:20:26)  

## 2019-09-08 NOTE — Progress Notes (Signed)
Patient Care Center Note   Diagnosis:Multiple Sclerosis   Provider:Patel, Donika, DO   Procedure:IV Tysabri   Note:Received pre-medications and IV Tysabri infusion via PIV. Tolerated well. Patient declined one hour post-transfusion observation. Vitalsignsstable. Discharge instructions given. Patient alert, oriented and ambulatory at discharge.

## 2019-09-22 ENCOUNTER — Other Ambulatory Visit: Payer: Managed Care, Other (non HMO)

## 2019-09-26 ENCOUNTER — Other Ambulatory Visit: Payer: Self-pay

## 2019-09-26 ENCOUNTER — Other Ambulatory Visit: Payer: Managed Care, Other (non HMO)

## 2019-09-26 DIAGNOSIS — G35 Multiple sclerosis: Secondary | ICD-10-CM

## 2019-09-26 DIAGNOSIS — Z79899 Other long term (current) drug therapy: Secondary | ICD-10-CM

## 2019-09-27 LAB — COMPREHENSIVE METABOLIC PANEL
AG Ratio: 1.5 (calc) (ref 1.0–2.5)
ALT: 10 U/L (ref 6–29)
AST: 13 U/L (ref 10–30)
Albumin: 4.2 g/dL (ref 3.6–5.1)
Alkaline phosphatase (APISO): 41 U/L (ref 31–125)
BUN: 11 mg/dL (ref 7–25)
CO2: 27 mmol/L (ref 20–32)
Calcium: 9.3 mg/dL (ref 8.6–10.2)
Chloride: 103 mmol/L (ref 98–110)
Creat: 0.6 mg/dL (ref 0.50–1.10)
Globulin: 2.8 g/dL (calc) (ref 1.9–3.7)
Glucose, Bld: 100 mg/dL — ABNORMAL HIGH (ref 65–99)
Potassium: 4.4 mmol/L (ref 3.5–5.3)
Sodium: 137 mmol/L (ref 135–146)
Total Bilirubin: 0.3 mg/dL (ref 0.2–1.2)
Total Protein: 7 g/dL (ref 6.1–8.1)

## 2019-09-27 LAB — CBC WITH DIFFERENTIAL/PLATELET
Absolute Monocytes: 479 cells/uL (ref 200–950)
Basophils Absolute: 28 cells/uL (ref 0–200)
Basophils Relative: 0.6 %
Eosinophils Absolute: 71 cells/uL (ref 15–500)
Eosinophils Relative: 1.5 %
HCT: 35.3 % (ref 35.0–45.0)
Hemoglobin: 11.1 g/dL — ABNORMAL LOW (ref 11.7–15.5)
Lymphs Abs: 2195 cells/uL (ref 850–3900)
MCH: 27 pg (ref 27.0–33.0)
MCHC: 31.4 g/dL — ABNORMAL LOW (ref 32.0–36.0)
MCV: 85.9 fL (ref 80.0–100.0)
MPV: 9.5 fL (ref 7.5–12.5)
Monocytes Relative: 10.2 %
Neutro Abs: 1927 cells/uL (ref 1500–7800)
Neutrophils Relative %: 41 %
Platelets: 292 10*3/uL (ref 140–400)
RBC: 4.11 10*6/uL (ref 3.80–5.10)
RDW: 14 % (ref 11.0–15.0)
Total Lymphocyte: 46.7 %
WBC: 4.7 10*3/uL (ref 3.8–10.8)

## 2019-09-28 ENCOUNTER — Encounter: Payer: Self-pay | Admitting: Neurology

## 2019-09-28 NOTE — Progress Notes (Signed)
Received approval from [employer] for patient's Tysabri for [place]. Valid for 300mg  1 time dose through 12/14/19.  Sent to scanning.

## 2019-10-01 LAB — STRATIFY JCV AB (W/ INDEX) W/ RFLX
Index Value: 0.22 — ABNORMAL HIGH
Stratify JCV (TM) Ab w/Reflex Inhibition: UNDETERMINED — AB

## 2019-10-01 LAB — RFLX STRATIFY JCV (TM) AB INHIBITION: JCV Antibody by Inhibition: NEGATIVE

## 2019-10-06 ENCOUNTER — Encounter: Payer: Self-pay | Admitting: Neurology

## 2019-10-06 NOTE — Progress Notes (Signed)
Received fax from [place] program for patient's Tysabri. Authorization valid with them from 10/05/19 to 05/01/20. PT Enrollment #: [id number].

## 2019-10-09 ENCOUNTER — Ambulatory Visit (HOSPITAL_COMMUNITY)
Admission: RE | Admit: 2019-10-09 | Discharge: 2019-10-09 | Disposition: A | Discharge status: Home / Self Care | Payer: Managed Care, Other (non HMO) | Source: Ambulatory Visit | Attending: Neurology | Admitting: Neurology

## 2019-10-09 ENCOUNTER — Other Ambulatory Visit: Payer: Self-pay

## 2019-10-09 DIAGNOSIS — G35 Multiple sclerosis: Secondary | ICD-10-CM | POA: Diagnosis not present

## 2019-10-09 MED ORDER — SODIUM CHLORIDE 0.9 % IV SOLN
INTRAVENOUS | Status: DC | PRN
  Administered 2019-10-09: 250 mL via INTRAVENOUS

## 2019-10-09 MED ORDER — ACETAMINOPHEN 325 MG PO TABS
650.0000 mg | ORAL_TABLET | Freq: Once | ORAL | Status: AC
  Administered 2019-10-09: 650 mg via ORAL
  Filled 2019-10-09: qty 2

## 2019-10-09 MED ORDER — SODIUM CHLORIDE 0.9 % IV SOLN
300.0000 mg | INTRAVENOUS | Status: DC
  Administered 2019-10-09: 300 mg via INTRAVENOUS
  Filled 2019-10-09: qty 15

## 2019-10-09 MED ORDER — LORATADINE 10 MG PO TABS
10.0000 mg | ORAL_TABLET | Freq: Every day | ORAL | Status: DC
  Administered 2019-10-09: 10 mg via ORAL
  Filled 2019-10-09: qty 1

## 2019-10-09 NOTE — Progress Notes (Signed)
Patient Care Center Note   Diagnosis:Multiple Sclerosis   Provider:Patel, Donika, DO   Procedure:IV Tysabri   Note:Patient received pre-medications and IV Tysabri infusion via PIV. Tolerated well. Patient declined one hour post-transfusion observation. Vitalsignsstable. Discharge instructions given. Patient alert, oriented and ambulatory at discharge. 

## 2019-10-09 NOTE — Discharge Instructions (Signed)
Natalizumab injection What is this medicine? NATALIZUMAB (na ta LIZ you mab) is used to treat relapsing multiple sclerosis. This drug is not a cure. It is also used to treat Crohn's disease. This medicine may be used for other purposes; ask your health care provider or pharmacist if you have questions. COMMON BRAND NAME(S): Tysabri What should I tell my health care provider before I take this medicine? They need to know if you have any of these conditions:  immune system problems  progressive multifocal leukoencephalopathy (PML)  an unusual or allergic reaction to natalizumab, other medicines, foods, dyes, or preservatives  pregnant or trying to get pregnant  breast-feeding How should I use this medicine? This medicine is for infusion into a vein. It is given by a health care professional in a hospital or clinic setting. A special MedGuide will be given to you by the pharmacist with each prescription and refill. Be sure to read this information carefully each time. Talk to your pediatrician regarding the use of this medicine in children. This medicine is not approved for use in children. Overdosage: If you think you have taken too much of this medicine contact a poison control center or emergency room at once. NOTE: This medicine is only for you. Do not share this medicine with others. What if I miss a dose? It is important not to miss your dose. Call your doctor or health care professional if you are unable to keep an appointment. What may interact with this medicine? Do not take this medicine with any of the following medications:  biologic medicines such as adalimumab, certolizumab, etanercept, golimumab, infliximab This medicine may also interact with the following medications:  azathioprine  cyclosporine  interferons  6-mercaptopurine  methotrexate  other medicines that lower your chance of fighting an infection  steroid medicines like prednisone or  cortisone  vaccines This list may not describe all possible interactions. Give your health care provider a list of all the medicines, herbs, non-prescription drugs, or dietary supplements you use. Also tell them if you smoke, drink alcohol, or use illegal drugs. Some items may interact with your medicine. What should I watch for while using this medicine? Your condition will be monitored carefully while you are receiving this medicine. Visit your doctor for regular check ups. Tell your doctor or healthcare professional if your symptoms do not start to get better or if they get worse. Stay away from people who are sick. Call your doctor or health care professional for advice if you get a fever, chills or sore throat, or other symptoms of a cold or flu. Do not treat yourself. In some patients, this medicine may cause a serious brain infection that may cause death. If you have any problems seeing, thinking, speaking, walking, or standing, tell your doctor right away. If you cannot reach your doctor, get urgent medical care. What side effects may I notice from receiving this medicine? Side effects that you should report to your doctor or health care professional as soon as possible:  allergic reactions like skin rash, itching or hives, swelling of the face, lips, or tongue  breathing problems  changes in vision  chest pain  confusion  depressed mood  dizziness  feeling faint; lightheaded; falls  general ill feeling or flu-like symptoms  loss of memory  missed menstrual periods  muscle weakness  problems with balance, talking, or walking  signs and symptoms of liver injury like dark yellow or brown urine; general ill feeling or flu-like symptoms; light-colored  stools; loss of appetite; nausea; right upper belly pain; unusually weak or tired; yellowing of the eyes or skin  suicidal thoughts, mood changes  unusual bruising or bleeding  unusually weak or tired Side effects that  usually do not require medical attention (report to your doctor or health care professional if they continue or are bothersome):  headache  joint pain  muscle cramps  muscle pain  nausea, vomiting  pain, redness, or irritation at site where injected  tiredness This list may not describe all possible side effects. Call your doctor for medical advice about side effects. You may report side effects to FDA at 1-800-FDA-1088. Where should I keep my medicine? This drug is given in a hospital or clinic and will not be stored at home. NOTE: This sheet is a summary. It may not cover all possible information. If you have questions about this medicine, talk to your doctor, pharmacist, or health care provider.  2020 Elsevier/Gold Standard (2018-06-27 13:20:26)  

## 2019-11-10 ENCOUNTER — Other Ambulatory Visit: Payer: Self-pay

## 2019-11-10 ENCOUNTER — Ambulatory Visit (HOSPITAL_COMMUNITY)
Admission: RE | Admit: 2019-11-10 | Discharge: 2019-11-10 | Disposition: A | Discharge status: Home / Self Care | Payer: Managed Care, Other (non HMO) | Source: Ambulatory Visit | Attending: Neurology | Admitting: Neurology

## 2019-11-10 DIAGNOSIS — G35 Multiple sclerosis: Secondary | ICD-10-CM | POA: Insufficient documentation

## 2019-11-10 MED ORDER — LORATADINE 10 MG PO TABS
10.0000 mg | ORAL_TABLET | Freq: Every day | ORAL | Status: DC
  Administered 2019-11-10: 10 mg via ORAL
  Filled 2019-11-10: qty 1

## 2019-11-10 MED ORDER — ACETAMINOPHEN 325 MG PO TABS
650.0000 mg | ORAL_TABLET | Freq: Once | ORAL | Status: AC
  Administered 2019-11-10: 650 mg via ORAL
  Filled 2019-11-10: qty 2

## 2019-11-10 MED ORDER — SODIUM CHLORIDE 0.9 % IV SOLN
INTRAVENOUS | Status: DC | PRN
  Administered 2019-11-10: 250 mL via INTRAVENOUS

## 2019-11-10 MED ORDER — SODIUM CHLORIDE 0.9 % IV SOLN
300.0000 mg | INTRAVENOUS | Status: DC
  Administered 2019-11-10: 300 mg via INTRAVENOUS
  Filled 2019-11-10: qty 15

## 2019-11-10 NOTE — Progress Notes (Signed)
Patient Care Center Note   Diagnosis:Multiple Sclerosis   Provider:Patel, Donika, DO   Procedure:IV Tysabri   Note:Patient received pre-medications and IV Tysabri infusion via PIV. Tolerated well. Patient declined one hour post-transfusion observation. Vitalsignsstable. Discharge instructions given. Patient alert, oriented and ambulatory at discharge.

## 2019-11-10 NOTE — Discharge Instructions (Signed)
Natalizumab injection What is this medicine? NATALIZUMAB (na ta LIZ you mab) is used to treat relapsing multiple sclerosis. This drug is not a cure. It is also used to treat Crohn's disease. This medicine may be used for other purposes; ask your health care provider or pharmacist if you have questions. COMMON BRAND NAME(S): Tysabri What should I tell my health care provider before I take this medicine? They need to know if you have any of these conditions:  immune system problems  progressive multifocal leukoencephalopathy (PML)  an unusual or allergic reaction to natalizumab, other medicines, foods, dyes, or preservatives  pregnant or trying to get pregnant  breast-feeding How should I use this medicine? This medicine is for infusion into a vein. It is given by a health care professional in a hospital or clinic setting. A special MedGuide will be given to you by the pharmacist with each prescription and refill. Be sure to read this information carefully each time. Talk to your pediatrician regarding the use of this medicine in children. This medicine is not approved for use in children. Overdosage: If you think you have taken too much of this medicine contact a poison control center or emergency room at once. NOTE: This medicine is only for you. Do not share this medicine with others. What if I miss a dose? It is important not to miss your dose. Call your doctor or health care professional if you are unable to keep an appointment. What may interact with this medicine? Do not take this medicine with any of the following medications:  biologic medicines such as adalimumab, certolizumab, etanercept, golimumab, infliximab This medicine may also interact with the following medications:  azathioprine  cyclosporine  interferons  6-mercaptopurine  methotrexate  other medicines that lower your chance of fighting an infection  steroid medicines like prednisone or  cortisone  vaccines This list may not describe all possible interactions. Give your health care provider a list of all the medicines, herbs, non-prescription drugs, or dietary supplements you use. Also tell them if you smoke, drink alcohol, or use illegal drugs. Some items may interact with your medicine. What should I watch for while using this medicine? Your condition will be monitored carefully while you are receiving this medicine. Visit your doctor for regular check ups. Tell your doctor or healthcare professional if your symptoms do not start to get better or if they get worse. Stay away from people who are sick. Call your doctor or health care professional for advice if you get a fever, chills or sore throat, or other symptoms of a cold or flu. Do not treat yourself. In some patients, this medicine may cause a serious brain infection that may cause death. If you have any problems seeing, thinking, speaking, walking, or standing, tell your doctor right away. If you cannot reach your doctor, get urgent medical care. What side effects may I notice from receiving this medicine? Side effects that you should report to your doctor or health care professional as soon as possible:  allergic reactions like skin rash, itching or hives, swelling of the face, lips, or tongue  breathing problems  changes in vision  chest pain  confusion  depressed mood  dizziness  feeling faint; lightheaded; falls  general ill feeling or flu-like symptoms  loss of memory  missed menstrual periods  muscle weakness  problems with balance, talking, or walking  signs and symptoms of liver injury like dark yellow or brown urine; general ill feeling or flu-like symptoms; light-colored  stools; loss of appetite; nausea; right upper belly pain; unusually weak or tired; yellowing of the eyes or skin  suicidal thoughts, mood changes  unusual bruising or bleeding  unusually weak or tired Side effects that  usually do not require medical attention (report to your doctor or health care professional if they continue or are bothersome):  headache  joint pain  muscle cramps  muscle pain  nausea, vomiting  pain, redness, or irritation at site where injected  tiredness This list may not describe all possible side effects. Call your doctor for medical advice about side effects. You may report side effects to FDA at 1-800-FDA-1088. Where should I keep my medicine? This drug is given in a hospital or clinic and will not be stored at home. NOTE: This sheet is a summary. It may not cover all possible information. If you have questions about this medicine, talk to your doctor, pharmacist, or health care provider.  2020 Elsevier/Gold Standard (2018-06-27 13:20:26)

## 2019-11-14 ENCOUNTER — Telehealth: Payer: Self-pay | Admitting: Neurology

## 2019-11-14 NOTE — Telephone Encounter (Signed)
Patient called wanting to know how long she needed to wait after having her infusion to get the Covid shot?

## 2019-11-14 NOTE — Telephone Encounter (Signed)
For Tysabri, there is no need to delay getting the COVID-19 vaccine.

## 2019-11-15 NOTE — Telephone Encounter (Signed)
Pt advised of Dr.Jaffe note. 

## 2019-11-15 NOTE — Telephone Encounter (Signed)
Patient called in again this morning and left a message to find out about when she can get the COVID-19 Vaccine.

## 2019-11-24 ENCOUNTER — Encounter: Payer: Self-pay | Admitting: Neurology

## 2019-11-24 NOTE — Progress Notes (Addendum)
Peggy Banks (Key: Drema Pry) Tysabri 300MG /15ML concentrate   Form Peggy Banks Form  Plan Contact [phone] phone [phone] fax Status  Approved; effective 12/20/19 to 12/18/20

## 2019-12-06 ENCOUNTER — Other Ambulatory Visit: Payer: Self-pay

## 2019-12-06 ENCOUNTER — Telehealth: Payer: Self-pay

## 2019-12-06 DIAGNOSIS — Z8616 Personal history of COVID-19: Secondary | ICD-10-CM

## 2019-12-06 HISTORY — DX: Personal history of COVID-19: Z86.16

## 2019-12-06 NOTE — Telephone Encounter (Signed)
Order has been completed for Tysabri infusion.

## 2019-12-06 NOTE — Telephone Encounter (Signed)
Prior Auth for Tysabri 300mg  approved. A new RX on file is needed.

## 2019-12-10 ENCOUNTER — Other Ambulatory Visit: Payer: Self-pay

## 2019-12-10 ENCOUNTER — Ambulatory Visit
Admission: EM | Admit: 2019-12-10 | Discharge: 2019-12-10 | Disposition: A | Discharge status: Home / Self Care | Payer: Managed Care, Other (non HMO) | Attending: Urgent Care | Admitting: Urgent Care

## 2019-12-10 DIAGNOSIS — Z23 Encounter for immunization: Secondary | ICD-10-CM | POA: Diagnosis not present

## 2019-12-10 DIAGNOSIS — T148XXA Other injury of unspecified body region, initial encounter: Secondary | ICD-10-CM

## 2019-12-10 DIAGNOSIS — S61216A Laceration without foreign body of right little finger without damage to nail, initial encounter: Secondary | ICD-10-CM

## 2019-12-10 MED ORDER — TETANUS-DIPHTH-ACELL PERTUSSIS 5-2.5-18.5 LF-MCG/0.5 IM SUSY
0.5000 mL | PREFILLED_SYRINGE | Freq: Once | INTRAMUSCULAR | Status: AC
  Administered 2019-12-10: 0.5 mL via INTRAMUSCULAR

## 2019-12-10 NOTE — ED Provider Notes (Signed)
[facility]   MRN: [id number] DOB: [birth year]  Subjective:   Peggy Banks is a 39 y.o. female presenting for suffering an avulsion laceration to right pinky finger while preparing dinner.  Patient tried to clean her wound but was bleeding so much she ended up wrapping it and just coming straight here.  Cannot recall her last Tdap.   Current Facility-Administered Medications:  .  acetaminophen (TYLENOL) tablet 650 mg, 650 mg, Oral, Once, Patel, Donika K, DO .  loratadine (CLARITIN) tablet 10 mg, 10 mg, Oral, Once, Patel, Donika K, DO .  natalizumab (TYSABRI) 300 mg in sodium chloride 0.9 % 100 mL IVPB, 300 mg, Intravenous, Once, Patel, Donika K, DO .  natalizumab (TYSABRI) 300 mg in sodium chloride 0.9 % 100 mL IVPB, 300 mg, Intravenous, Q28 days, Patel, Donika K, DO  Current Outpatient Medications:  .  acetaminophen (TYLENOL) 500 MG tablet, Take 1,000 mg by mouth every 6 (six) hours as needed for mild pain or headache (PRN infusion)., Disp: , Rfl:  .  AMBULATORY NON FORMULARY MEDICATION, 1 Units by Other route daily. Right AFO, Disp: 1 Units, Rfl: 0 .  AMBULATORY NON FORMULARY MEDICATION, 1 Units by Other route daily. Right AFO, Disp: 1 Units, Rfl: 0 .  B Complex Vitamins (VITAMIN B COMPLEX PO), Take by mouth., Disp: , Rfl:  .  baclofen (LIORESAL) 10 MG tablet, TAKE 1 TABLET BY MOUTH 3  TIMES DAILY, Disp: 270 tablet, Rfl: 3 .  cetirizine (ZYRTEC) 10 MG tablet, Take 10 mg by mouth daily., Disp: , Rfl:  .  Cholecalciferol (VITAMIN D PO), Take 5,000 Units by mouth., Disp: , Rfl:  .  Ferrous Gluconate-C-Folic Acid (IRON-C PO), Take by mouth., Disp: , Rfl:  .  lisinopril-hydrochlorothiazide (PRINZIDE,ZESTORETIC) 10-12.5 MG tablet, Take 1 tablet by mouth daily., Disp: , Rfl:  .  Multiple Vitamin (MULTIVITAMIN) tablet, Take 1 tablet by mouth daily., Disp: , Rfl:  .  Omega-3 Fatty Acids (FISH OIL) 1360 MG CAPS, Take 2 capsules by mouth daily., Disp: , Rfl:  .  QSYMIA 7.5-46 MG  CP24, , Disp: , Rfl:  .  rosuvastatin (CRESTOR) 10 MG tablet, Take 10 mg by mouth daily., Disp: , Rfl:    Allergies  Allergen Reactions  . Cefdinir Swelling    Past Medical History:  Diagnosis Date  . Hypertension   . MS (multiple sclerosis) ([place])      Past Surgical History:  Procedure Laterality Date  . CESAREAN SECTION    . EYE SURGERY     lasik bilat    Family History  Problem Relation Age of Onset  . Hypothyroidism Mother   . Hypertension Mother   . Hypertension Father   . Healthy Sister   . Cancer Maternal Grandfather   . Stroke Paternal Grandmother   . Other Paternal Grandfather        MVA    Social History   Tobacco Use  . Smoking status: Never Smoker  . Smokeless tobacco: Never Used  Vaping Use  . Vaping Use: Never used  Substance Use Topics  . Alcohol use: No    Comment: Occasional  . Drug use: No    ROS   Objective:   Vitals: BP 115/83 (BP Location: Left Arm)   Pulse 89   Temp 98.1 F (36.7 C) (Oral)   Resp 17   LMP 11/28/2019 (Exact Date)   SpO2 99%   Physical Exam Constitutional:      General: She is not in acute distress.  Appearance: Normal appearance. She is well-developed. She is not ill-appearing, toxic-appearing or diaphoretic.  HENT:     Head: Normocephalic and atraumatic.     Nose: Nose normal.     Mouth/Throat:     Mouth: Mucous membranes are moist.     Pharynx: Oropharynx is clear.  Eyes:     General: No scleral icterus.       Right eye: No discharge.        Left eye: No discharge.     Extraocular Movements: Extraocular movements intact.     Conjunctiva/sclera: Conjunctivae normal.     Pupils: Pupils are equal, round, and reactive to light.  Cardiovascular:     Rate and Rhythm: Normal rate.  Pulmonary:     Effort: Pulmonary effort is normal.  Musculoskeletal:       Hands:  Skin:    General: Skin is warm and dry.  Neurological:     General: No focal deficit present.     Mental Status: She is alert and  oriented to person, place, and time.  Psychiatric:        Mood and Affect: Mood normal.        Behavior: Behavior normal.        Thought Content: Thought content normal.        Judgment: Judgment normal.    1 cc of 2% lidocaine without epinephrine injected for vasoconstriction.  Xeroform dressing applied.  Pressure dressing with nonadherent dressing thereafter, secured with Coban.  Assessment and Plan :   PDMP not reviewed this encounter.  1. Skin avulsion   2. Laceration of right little finger without foreign body without damage to nail, initial encounter     The first pressure dressing applied failed and patient began to bleed through.  I subsequently applied another dressing with good success.  Tdap updated today.  Wound care reviewed. Counseled patient on potential for adverse effects with medications prescribed/recommended today, ER and return-to-clinic precautions discussed, patient verbalized understanding.    Jaynee Eagles, [place] 12/10/19 1545

## 2019-12-10 NOTE — ED Triage Notes (Signed)
Patient lacerated her right pinky while preparing dinner 2 hour ago. Pt is aox4 and ambulatory.

## 2019-12-10 NOTE — Discharge Instructions (Signed)
Please change your dressing 2-3 times daily. Do not apply any ointments or creams. Each time you change your dressing, make sure you clean gently around the perimeter of the wound with gentle soap and warm water. Pat your wound dry and let it air out if possible for 1-2 hours before reapplying another dressing.   If you develop redness, drainage of pus, hand pain and worsening swelling then please come back to our clinic as this is a sign of infection.  Otherwise please use Tylenol and/or ibuprofen to help you with your pain control.

## 2019-12-11 ENCOUNTER — Encounter (HOSPITAL_COMMUNITY): Payer: Managed Care, Other (non HMO)

## 2019-12-11 ENCOUNTER — Telehealth: Payer: Self-pay | Admitting: Neurology

## 2019-12-11 NOTE — Telephone Encounter (Signed)
Pt called

## 2019-12-11 NOTE — Telephone Encounter (Signed)
Patient is supposed to have an infusion today, but she cut her hand yesterday and had to go to the urgent care. She would like to know if she should postpone her infusion or if she can still get it?

## 2019-12-11 NOTE — Telephone Encounter (Signed)
Patient called back to add: she is getting her covid vaccine on Sunday and she is supposed to have her Tysabri infusion on Monday. She would like to verify this will be okay or does she need to space them out?

## 2019-12-11 NOTE — Telephone Encounter (Signed)
Patient called to check on the answer, she has an appointment at 9:00 AM.

## 2019-12-11 NOTE — Telephone Encounter (Signed)
Please advise, if okay to do, over the weekend, she received Tdap,

## 2019-12-12 NOTE — Telephone Encounter (Signed)
She may continue Tysabri as normal, the vaccine will not have any effect on this.

## 2019-12-13 NOTE — Telephone Encounter (Signed)
Left message okay to resume infusions.

## 2019-12-18 ENCOUNTER — Encounter (HOSPITAL_COMMUNITY): Payer: Managed Care, Other (non HMO)

## 2019-12-22 ENCOUNTER — Ambulatory Visit (HOSPITAL_COMMUNITY)
Admission: RE | Admit: 2019-12-22 | Discharge: 2019-12-22 | Disposition: A | Discharge status: Home / Self Care | Payer: Managed Care, Other (non HMO) | Source: Ambulatory Visit | Attending: Neurology | Admitting: Neurology

## 2019-12-22 ENCOUNTER — Other Ambulatory Visit: Payer: Self-pay

## 2019-12-22 DIAGNOSIS — G35 Multiple sclerosis: Secondary | ICD-10-CM | POA: Insufficient documentation

## 2019-12-22 MED ORDER — LORATADINE 10 MG PO TABS
10.0000 mg | ORAL_TABLET | Freq: Every day | ORAL | Status: DC
  Administered 2019-12-22: 10 mg via ORAL
  Filled 2019-12-22: qty 1

## 2019-12-22 MED ORDER — SODIUM CHLORIDE 0.9 % IV SOLN
INTRAVENOUS | Status: DC | PRN
  Administered 2019-12-22: 250 mL via INTRAVENOUS

## 2019-12-22 MED ORDER — ACETAMINOPHEN 500 MG PO TABS
1000.0000 mg | ORAL_TABLET | Freq: Once | ORAL | Status: AC
  Administered 2019-12-22: 1000 mg via ORAL
  Filled 2019-12-22: qty 2

## 2019-12-22 MED ORDER — SODIUM CHLORIDE 0.9 % IV SOLN
300.0000 mg | INTRAVENOUS | Status: DC
  Administered 2019-12-22: 300 mg via INTRAVENOUS
  Filled 2019-12-22: qty 15

## 2019-12-22 NOTE — Discharge Instructions (Signed)
Natalizumab injection What is this medicine? NATALIZUMAB (na ta LIZ you mab) is used to treat relapsing multiple sclerosis. This drug is not a cure. It is also used to treat Crohn's disease. This medicine may be used for other purposes; ask your health care provider or pharmacist if you have questions. COMMON BRAND NAME(S): Tysabri What should I tell my health care provider before I take this medicine? They need to know if you have any of these conditions:  immune system problems  progressive multifocal leukoencephalopathy (PML)  an unusual or allergic reaction to natalizumab, other medicines, foods, dyes, or preservatives  pregnant or trying to get pregnant  breast-feeding How should I use this medicine? This medicine is for infusion into a vein. It is given by a health care professional in a hospital or clinic setting. A special MedGuide will be given to you by the pharmacist with each prescription and refill. Be sure to read this information carefully each time. Talk to your pediatrician regarding the use of this medicine in children. This medicine is not approved for use in children. Overdosage: If you think you have taken too much of this medicine contact a poison control center or emergency room at once. NOTE: This medicine is only for you. Do not share this medicine with others. What if I miss a dose? It is important not to miss your dose. Call your doctor or health care professional if you are unable to keep an appointment. What may interact with this medicine? Do not take this medicine with any of the following medications:  biologic medicines such as adalimumab, certolizumab, etanercept, golimumab, infliximab This medicine may also interact with the following medications:  azathioprine  cyclosporine  interferons  6-mercaptopurine  methotrexate  other medicines that lower your chance of fighting an infection  steroid medicines like prednisone or  cortisone  vaccines This list may not describe all possible interactions. Give your health care provider a list of all the medicines, herbs, non-prescription drugs, or dietary supplements you use. Also tell them if you smoke, drink alcohol, or use illegal drugs. Some items may interact with your medicine. What should I watch for while using this medicine? Your condition will be monitored carefully while you are receiving this medicine. Visit your doctor for regular check ups. Tell your doctor or healthcare professional if your symptoms do not start to get better or if they get worse. Stay away from people who are sick. Call your doctor or health care professional for advice if you get a fever, chills or sore throat, or other symptoms of a cold or flu. Do not treat yourself. In some patients, this medicine may cause a serious brain infection that may cause death. If you have any problems seeing, thinking, speaking, walking, or standing, tell your doctor right away. If you cannot reach your doctor, get urgent medical care. What side effects may I notice from receiving this medicine? Side effects that you should report to your doctor or health care professional as soon as possible:  allergic reactions like skin rash, itching or hives, swelling of the face, lips, or tongue  breathing problems  changes in vision  chest pain  confusion  depressed mood  dizziness  feeling faint; lightheaded; falls  general ill feeling or flu-like symptoms  loss of memory  missed menstrual periods  muscle weakness  problems with balance, talking, or walking  signs and symptoms of liver injury like dark yellow or brown urine; general ill feeling or flu-like symptoms; light-colored  stools; loss of appetite; nausea; right upper belly pain; unusually weak or tired; yellowing of the eyes or skin  suicidal thoughts, mood changes  unusual bruising or bleeding  unusually weak or tired Side effects that  usually do not require medical attention (report to your doctor or health care professional if they continue or are bothersome):  headache  joint pain  muscle cramps  muscle pain  nausea, vomiting  pain, redness, or irritation at site where injected  tiredness This list may not describe all possible side effects. Call your doctor for medical advice about side effects. You may report side effects to FDA at 1-800-FDA-1088. Where should I keep my medicine? This drug is given in a hospital or clinic and will not be stored at home. NOTE: This sheet is a summary. It may not cover all possible information. If you have questions about this medicine, talk to your doctor, pharmacist, or health care provider.  2020 Elsevier/Gold Standard (2018-06-27 13:20:26)

## 2019-12-22 NOTE — Progress Notes (Signed)
Patient Care Center Note   Diagnosis:Multiple Sclerosis   Provider:Patel, Donika, DO   Procedure:IV Tysabri   Note:Sign and held order released for Tysabir 300 mg every 28 days x 5 doses. Patient received pre-medications and IV Tysabri infusion via PIV. Tolerated well. Patient declined one hour post-transfusion observation. Vitalsignsstable. Discharge instructions given. Patient alert, oriented and ambulatory at discharge. Current order is good for 4 more doses of Tysabri.

## 2020-01-11 ENCOUNTER — Telehealth: Payer: Self-pay | Admitting: Neurology

## 2020-01-11 NOTE — Telephone Encounter (Signed)
Spoke to [employer] at [phone] E-mail: [email or web address]. Was informed that patient could proceed with her infusion, however she may want to wait longer to receive her infusion to allow her body a chance to recover and have her natural antibodies recover and allow her immune system to return to it's regular state. Alvie Heidelberg did recommend that patient wait approx 5-6 weeks from her COVID dx to resume her infusion which is approx 1/23-1/28.

## 2020-01-11 NOTE — Telephone Encounter (Signed)
Patient currently has covid, tested 01/04/20 and is positive. She is due for an infusion next week for Tysabri and wants to know if it is okay to proceed with getting the infusion.

## 2020-01-12 NOTE — Telephone Encounter (Signed)
Please notify pt of these recommendations - ok to push her next infusion out 1-2 weeks.

## 2020-01-12 NOTE — Telephone Encounter (Signed)
Called patient and left a message for a call back.  

## 2020-01-15 NOTE — Telephone Encounter (Signed)
Patient informed that we will push her infusion out another two weeks.  She is scheduled for 1/17, will contact infusion center to schedule during the week on 1/24.

## 2020-01-15 NOTE — Telephone Encounter (Signed)
Patient returned call to [facility], notified patient a nurse would give her a call back to update her

## 2020-01-15 NOTE — Telephone Encounter (Signed)
Called Patient [place] and had Ms Drone moved to 01/29/20 at 8:30. Then called Ms. Vetere and she is aware of new date and time.

## 2020-01-19 ENCOUNTER — Other Ambulatory Visit: Payer: Self-pay | Admitting: Neurology

## 2020-01-22 ENCOUNTER — Encounter (HOSPITAL_COMMUNITY): Payer: Managed Care, Other (non HMO)

## 2020-01-29 ENCOUNTER — Other Ambulatory Visit: Payer: Self-pay

## 2020-01-29 ENCOUNTER — Ambulatory Visit (HOSPITAL_COMMUNITY)
Admission: RE | Admit: 2020-01-29 | Discharge: 2020-01-29 | Disposition: A | Discharge status: Home / Self Care | Payer: Managed Care, Other (non HMO) | Source: Ambulatory Visit | Attending: Neurology | Admitting: Neurology

## 2020-01-29 DIAGNOSIS — G35 Multiple sclerosis: Secondary | ICD-10-CM | POA: Insufficient documentation

## 2020-01-29 MED ORDER — ACETAMINOPHEN 325 MG PO TABS
650.0000 mg | ORAL_TABLET | Freq: Once | ORAL | Status: AC
  Administered 2020-01-29: 650 mg via ORAL
  Filled 2020-01-29: qty 2

## 2020-01-29 MED ORDER — SODIUM CHLORIDE 0.9 % IV SOLN
INTRAVENOUS | Status: DC | PRN
  Administered 2020-01-29: 250 mL via INTRAVENOUS

## 2020-01-29 MED ORDER — ACETAMINOPHEN 500 MG PO TABS: 1000.0000 mg | ORAL_TABLET | Freq: Once | ORAL | Status: DC

## 2020-01-29 MED ORDER — SODIUM CHLORIDE 0.9 % IV SOLN
300.0000 mg | INTRAVENOUS | Status: DC
  Administered 2020-01-29: 300 mg via INTRAVENOUS
  Filled 2020-01-29 (×2): qty 15

## 2020-01-29 MED ORDER — LORATADINE 10 MG PO TABS
10.0000 mg | ORAL_TABLET | Freq: Once | ORAL | Status: AC
  Administered 2020-01-29: 10 mg via ORAL
  Filled 2020-01-29: qty 1

## 2020-01-29 NOTE — Progress Notes (Signed)
Patient Care Center Note   Diagnosis:Multiple Sclerosis   Provider:Patel, Donika, DO   Procedure:IV Tysabri   Note:Patient received pre-medications and IV Tysabri infusion via PIV. Tolerated well. Patient declined one hour post-infusion observation. Vitalsignsstable. Discharge instructions given. Patient alert, oriented and ambulatory at discharge. Current order is good for 3 more doses of Tysabri.

## 2020-02-29 ENCOUNTER — Other Ambulatory Visit: Payer: Self-pay

## 2020-02-29 ENCOUNTER — Non-Acute Institutional Stay (HOSPITAL_COMMUNITY)
Admission: RE | Admit: 2020-02-29 | Discharge: 2020-02-29 | Disposition: A | Discharge status: Home / Self Care | Payer: Managed Care, Other (non HMO) | Source: Ambulatory Visit | Attending: Internal Medicine | Admitting: Internal Medicine

## 2020-02-29 DIAGNOSIS — G35 Multiple sclerosis: Secondary | ICD-10-CM | POA: Diagnosis present

## 2020-02-29 MED ORDER — SODIUM CHLORIDE 0.9 % IV SOLN
300.0000 mg | INTRAVENOUS | Status: DC
  Administered 2020-02-29: 300 mg via INTRAVENOUS
  Filled 2020-02-29: qty 15

## 2020-02-29 MED ORDER — ACETAMINOPHEN 325 MG PO TABS
650.0000 mg | ORAL_TABLET | Freq: Once | ORAL | Status: AC
  Administered 2020-02-29: 650 mg via ORAL
  Filled 2020-02-29: qty 2

## 2020-02-29 MED ORDER — SODIUM CHLORIDE 0.9 % IV SOLN
INTRAVENOUS | Status: DC | PRN
  Administered 2020-02-29: 250 mL via INTRAVENOUS

## 2020-02-29 MED ORDER — LORATADINE 10 MG PO TABS
10.0000 mg | ORAL_TABLET | Freq: Once | ORAL | Status: AC
  Administered 2020-02-29: 10 mg via ORAL
  Filled 2020-02-29: qty 1

## 2020-02-29 NOTE — Progress Notes (Signed)
Patient Care Center Note   Diagnosis:Multiple Sclerosis   Provider:Patel, Donika, DO   Procedure:IV Tysabri   Note:Patient received pre-medications and IV Tysabri infusion via PIV. Tolerated well. Patient declined one hour post-infusion observation. Vitalsignsstable. Discharge instructions given. Patient alert, oriented and ambulatory at discharge.Current order is good for 2 more doses of Tysabri.

## 2020-03-18 ENCOUNTER — Ambulatory Visit: Payer: Managed Care, Other (non HMO) | Admitting: Neurology

## 2020-03-27 NOTE — Progress Notes (Unsigned)
Follow-up Visit   Date: 03/28/20    Peggy Banks MRN: [id number] DOB: [birth year]   Interim History: Peggy Banks is a 40 y.o. right-handed African American female with hypertension returning to the clinic for follow-up of multiple sclerosis.  The patient was accompanied to the clinic by self.    She is here for 1 year follow-up visit.  She had COVID infection in January and therefore her [name] was delayed by two weeks. She recovered well with conservative management.  Multiple sclerosis is stable, no new neurological complaints. She had one fall in December, no injuries.   She is due for surveillance imaging.    The past two years have been difficult due to family loss. Her father and grandmother passed away in 2018/04/30 and her mother was diagnosed with breast cancer.  Fortunately, her mother was treated and cancer-free.   She remains on baclofen 10mg  three times daily.  Medications:   Current Outpatient Medications on File Prior to Visit  Medication Sig Dispense Refill  . acetaminophen (TYLENOL) 500 MG tablet Take 1,000 mg by mouth every 6 (six) hours as needed for mild pain or headache (PRN infusion).    . baclofen (LIORESAL) 10 MG tablet TAKE 1 TABLET BY MOUTH 3  TIMES DAILY 270 tablet 1  . Cholecalciferol (VITAMIN D PO) Take 5,000 Units by mouth.    Marland Kitchen lisinopril-hydrochlorothiazide (PRINZIDE,ZESTORETIC) 10-12.5 MG tablet Take 1 tablet by mouth daily.    . Multiple Vitamin (MULTIVITAMIN) tablet Take 1 tablet by mouth daily.    . Omega-3 Fatty Acids (FISH OIL) 1360 MG CAPS Take 2 capsules by mouth daily.    . rosuvastatin (CRESTOR) 10 MG tablet Take 10 mg by mouth daily.    . AMBULATORY NON FORMULARY MEDICATION 1 Units by Other route daily. Right AFO 1 Units 0   Current Facility-Administered Medications on File Prior to Visit  Medication Dose Route Frequency Provider Last Rate Last Admin  . acetaminophen (TYLENOL) tablet 650 mg  650 mg Oral Once Posey Pronto, Donika K, DO      .  loratadine (CLARITIN) tablet 10 mg  10 mg Oral Once Patel, Donika K, DO      . natalizumab (TYSABRI) 300 mg in sodium chloride 0.9 % 100 mL IVPB  300 mg Intravenous Once Narda Amber K, DO        Allergies:  Allergies  Allergen Reactions  . Cefdinir Swelling    Vital Signs:  BP 110/76   Pulse 82   Ht 5\' 2"  (1.575 m)   Wt 205 lb (93 kg)   SpO2 98%   BMI 37.49 kg/m   Neurological Exam: MENTAL STATUS including orientation to time, place, person, recent and remote memory, attention span and concentration, language, and fund of knowledge is normal.  Speech is not dysarthric.  CRANIAL NERVES:  Pupils round and reactive to light.  Extraocular muscles intact.  Face is symmetric. Tongue is midline.    MOTOR:  Motor strength is 5/5 in all extremities, except right foot dorsiflexion 5-/5, eversion 5-/5, inversion 4/5, toe extension and flexion 5/5.  There mild RLE spasticity.  MSRs:  Right  Left brachioradialis 3+  brachioradialis 2+  biceps 3+  biceps 2+  triceps 3+  triceps 2+  patellar 3+  patellar 3+  ankle jerk 3+  ankle jerk 2+   SENSORY: Vibration is absent at the toes bilaterally  COORDINATION/GAIT:  Gait appears stable today with minimal R knee hyperextension, trace dragging of the right foot and inversion of the foot  Data: MRI brain personally reviewed dated 02/23/2006 which shows extensive scatter T2 hyperintensities in the subcortical and periventricular regions, with a larger confluent area on the left frontal horn and right posterior horn of the lateral ventricle.  There is also involvement of the corpus callosum. There is small foci of enhancement involving the subcortical parito-occiptal region.  MRI brain and cervical spine with and without contrast 02/07/2016:  Findings consistent with multiple sclerosis. Disease is extensive throughout the brain and cervical cord and there is white matter atrophy. Four foci  of enhancement/active demyelination are noted.  MRI thoracic spine wwo contrast 03/10/2016:  1. Widespread severe thoracic spinal cord signal abnormality compatible with chronic demyelinating disease. No areas of acute thoracic cord demyelination are identified. 2. Isolated small right paracentral thoracic disc herniation at T10-T11. Borderline to mild associated spinal stenosis at that level.  MRI brain and cervical spine wwo contrast 03/07/2017: Extensive cerebral white matter changes involving the supratentorial and infratentorial brain, consistent with history of multiple sclerosis, overall similar appearance and not significantly progressed relative to most recent MRI from 02/07/2016. Single punctate nodular focus of enhancement at the parasagittal high right frontal lobe, compatible with active demyelination.  MRI CERVICAL SPINE IMPRESSION: 1. Extensive patchy cord signal abnormality throughout the cervical spinal cord, consistent with demyelinating disease, overall similar relative to previous exam. No evidence for active demyelination. 2. Small central disc protrusions at C4-5 and C6-7 without stenosis.  IMPRESSION/PLAN: Relapsing remitting multiple sclerosis, symptom manifestation in 2005, diagnosed in 2018.  Imaging with disease burden involving the juxtacortical, periventricular, brainstem, cerebellum, and nearly every level of the cervical and thoracic spine. - Started on Tysabri in April 2018.  JCV titers negative - Clinically, she has right knee hyperextension and spasticity, overall stable.   PLAN: 1.  Continue Tysabri infusion every 4 weeks.  I will transfer her orders to the [address] infusion center. 2.  MRI brain and cervical spine wwo contrast  3.  Check CBC, CMP, and JCV today and follow every 6 months 4.  Continue vitamin D 5000 units 5.  Continue baclofen 10mg  three times daily  Return to clinic in 1 year    Thank you for allowing me to participate in patient's care.   If I can answer any additional questions, I would be pleased to do so.    Sincerely,    Donika K. Posey Pronto, DO

## 2020-03-28 ENCOUNTER — Other Ambulatory Visit: Payer: Self-pay

## 2020-03-28 ENCOUNTER — Ambulatory Visit: Payer: Managed Care, Other (non HMO) | Admitting: Neurology

## 2020-03-28 ENCOUNTER — Encounter: Payer: Self-pay | Admitting: Neurology

## 2020-03-28 VITALS — BP 110/76 | HR 82 | Ht 62.0 in | Wt 205.0 lb

## 2020-03-28 DIAGNOSIS — G35 Multiple sclerosis: Secondary | ICD-10-CM | POA: Diagnosis not present

## 2020-03-28 NOTE — Patient Instructions (Addendum)
Check labs  MRI cervical spine and brain   We will contact you about transfering your Tysabri infusion to [facility] infusion center  Return to clinic 1 year

## 2020-03-29 ENCOUNTER — Non-Acute Institutional Stay (HOSPITAL_COMMUNITY)
Admission: RE | Admit: 2020-03-29 | Discharge: 2020-03-29 | Disposition: A | Discharge status: Home / Self Care | Payer: Managed Care, Other (non HMO) | Source: Ambulatory Visit | Attending: Internal Medicine | Admitting: Internal Medicine

## 2020-03-29 DIAGNOSIS — G35 Multiple sclerosis: Secondary | ICD-10-CM | POA: Diagnosis present

## 2020-03-29 MED ORDER — ACETAMINOPHEN 325 MG PO TABS
650.0000 mg | ORAL_TABLET | Freq: Once | ORAL | Status: AC
  Administered 2020-03-29: 650 mg via ORAL
  Filled 2020-03-29: qty 2

## 2020-03-29 MED ORDER — SODIUM CHLORIDE 0.9 % IV SOLN
300.0000 mg | INTRAVENOUS | Status: DC
  Administered 2020-03-29: 300 mg via INTRAVENOUS
  Filled 2020-03-29: qty 15

## 2020-03-29 MED ORDER — LORATADINE 10 MG PO TABS
10.0000 mg | ORAL_TABLET | Freq: Every day | ORAL | Status: DC
  Administered 2020-03-29: 10 mg via ORAL
  Filled 2020-03-29: qty 1

## 2020-03-29 MED ORDER — SODIUM CHLORIDE 0.9 % IV SOLN
INTRAVENOUS | Status: DC | PRN
  Administered 2020-03-29: 250 mL via INTRAVENOUS

## 2020-03-29 NOTE — Progress Notes (Signed)
Patient Care Center Note   Diagnosis:Multiple Sclerosis   Provider:Patel, Donika, DO   Procedure:IV Tysabri   Note:Patient received pre-medications and IV Tysabri infusion via PIV. Tolerated well. Patient declined one hour post-infusionobservation. Vitalsignsstable. Patient will be receiving future infusions at [facility] infusion center. Patient alert, oriented and ambulatory at discharge.

## 2020-03-29 NOTE — Discharge Instructions (Signed)
Natalizumab injection What is this medicine? NATALIZUMAB (na ta LIZ you mab) is used to treat relapsing multiple sclerosis. This drug is not a cure. It is also used to treat Crohn's disease. This medicine may be used for other purposes; ask your health care provider or pharmacist if you have questions. COMMON BRAND NAME(S): Tysabri What should I tell my health care provider before I take this medicine? They need to know if you have any of these conditions:  immune system problems  progressive multifocal leukoencephalopathy (PML)  an unusual or allergic reaction to natalizumab, other medicines, foods, dyes, or preservatives  pregnant or trying to get pregnant  breast-feeding How should I use this medicine? This medicine is for infusion into a vein. It is given by a health care professional in a hospital or clinic setting. A special MedGuide will be given to you by the pharmacist with each prescription and refill. Be sure to read this information carefully each time. Talk to your pediatrician regarding the use of this medicine in children. This medicine is not approved for use in children. Overdosage: If you think you have taken too much of this medicine contact a poison control center or emergency room at once. NOTE: This medicine is only for you. Do not share this medicine with others. What if I miss a dose? It is important not to miss your dose. Call your doctor or health care professional if you are unable to keep an appointment. What may interact with this medicine? Do not take this medicine with any of the following medications:  biologic medicines such as adalimumab, certolizumab, etanercept, golimumab, infliximab This medicine may also interact with the following medications:  azathioprine  cyclosporine  interferons  6-mercaptopurine  methotrexate  other medicines that lower your chance of fighting an infection  steroid medicines like prednisone or  cortisone  vaccines This list may not describe all possible interactions. Give your health care provider a list of all the medicines, herbs, non-prescription drugs, or dietary supplements you use. Also tell them if you smoke, drink alcohol, or use illegal drugs. Some items may interact with your medicine. What should I watch for while using this medicine? Your condition will be monitored carefully while you are receiving this medicine. Visit your doctor for regular check ups. Tell your doctor or healthcare professional if your symptoms do not start to get better or if they get worse. Stay away from people who are sick. Call your doctor or health care professional for advice if you get a fever, chills or sore throat, or other symptoms of a cold or flu. Do not treat yourself. In some patients, this medicine may cause a serious brain infection that may cause death. If you have any problems seeing, thinking, speaking, walking, or standing, tell your doctor right away. If you cannot reach your doctor, get urgent medical care. What side effects may I notice from receiving this medicine? Side effects that you should report to your doctor or health care professional as soon as possible:  allergic reactions like skin rash, itching or hives, swelling of the face, lips, or tongue  breathing problems  changes in vision  chest pain  confusion  depressed mood  dizziness  feeling faint; lightheaded; falls  general ill feeling or flu-like symptoms  loss of memory  missed menstrual periods  muscle weakness  problems with balance, talking, or walking  signs and symptoms of liver injury like dark yellow or brown urine; general ill feeling or flu-like symptoms; light-colored  stools; loss of appetite; nausea; right upper belly pain; unusually weak or tired; yellowing of the eyes or skin  suicidal thoughts, mood changes  unusual bruising or bleeding  unusually weak or tired Side effects that  usually do not require medical attention (report to your doctor or health care professional if they continue or are bothersome):  headache  joint pain  muscle cramps  muscle pain  nausea, vomiting  pain, redness, or irritation at site where injected  tiredness This list may not describe all possible side effects. Call your doctor for medical advice about side effects. You may report side effects to FDA at 1-800-FDA-1088. Where should I keep my medicine? This drug is given in a hospital or clinic and will not be stored at home. NOTE: This sheet is a summary. It may not cover all possible information. If you have questions about this medicine, talk to your doctor, pharmacist, or health care provider.  2021 Elsevier/Gold Standard (2018-06-27 13:20:26)

## 2020-04-01 ENCOUNTER — Other Ambulatory Visit: Payer: Self-pay

## 2020-04-01 ENCOUNTER — Other Ambulatory Visit: Payer: Self-pay | Admitting: Internal Medicine

## 2020-04-01 ENCOUNTER — Other Ambulatory Visit: Payer: Managed Care, Other (non HMO)

## 2020-04-01 ENCOUNTER — Telehealth: Payer: Self-pay | Admitting: Pharmacy Technician

## 2020-04-01 ENCOUNTER — Telehealth: Payer: Self-pay

## 2020-04-01 DIAGNOSIS — G35 Multiple sclerosis: Secondary | ICD-10-CM

## 2020-04-01 DIAGNOSIS — Z1231 Encounter for screening mammogram for malignant neoplasm of breast: Secondary | ICD-10-CM

## 2020-04-01 LAB — COMPREHENSIVE METABOLIC PANEL
ALT: 12 U/L (ref 0–35)
AST: 14 U/L (ref 0–37)
Albumin: 4.3 g/dL (ref 3.5–5.2)
Alkaline Phosphatase: 40 U/L (ref 39–117)
BUN: 11 mg/dL (ref 6–23)
CO2: 29 mEq/L (ref 19–32)
Calcium: 9.5 mg/dL (ref 8.4–10.5)
Chloride: 102 mEq/L (ref 96–112)
Creatinine, Ser: 0.63 mg/dL (ref 0.40–1.20)
GFR: 111.77 mL/min (ref >60.00)
Glucose, Bld: 105 mg/dL — ABNORMAL HIGH (ref 70–99)
Potassium: 3.9 mEq/L (ref 3.5–5.1)
Sodium: 138 mEq/L (ref 135–145)
Total Bilirubin: 0.3 mg/dL (ref 0.2–1.2)
Total Protein: 7.5 g/dL (ref 6.0–8.3)

## 2020-04-01 LAB — CBC
HCT: 32.3 % — ABNORMAL LOW (ref 36.0–46.0)
Hemoglobin: 10.8 g/dL — ABNORMAL LOW (ref 12.0–15.0)
MCHC: 33.4 g/dL (ref 30.0–36.0)
MCV: 80.4 fl (ref 78.0–100.0)
Platelets: 338 10*3/uL (ref 150.0–400.0)
RBC: 4.01 Mil/uL (ref 3.87–5.11)
RDW: 15.5 % (ref 11.5–15.5)
WBC: 5.2 10*3/uL (ref 4.0–10.5)

## 2020-04-01 NOTE — Telephone Encounter (Signed)
Patient called and left a message requesting a call back about where her new infusion location will be.

## 2020-04-01 NOTE — Telephone Encounter (Signed)
Received notification from [facility] regarding a prior authorization for TYSABRI. Authorization has been APPROVED from 11/01/19 to 05/01/20.   Authorization/TYSABRI ENROLLMENT # [id number] Phone # [phone].  Patient will need to enroll every 6 month.  Per [facility] program Dr will need to send info for re-enrollment.

## 2020-04-01 NOTE — Telephone Encounter (Signed)
[name] at [facility] [address] [place],  [place]  [address]

## 2020-04-01 NOTE — Telephone Encounter (Signed)
Crystal called from Infusion and stated that patient called and informed them that her infusion location needed to be being switched? Crystal wanted to clarify and update her records if needed.   Informed Crystal that I would check in with Dr. Posey Pronto and give her a call back.

## 2020-04-02 NOTE — Telephone Encounter (Signed)
Called Crystal from the Infusion center and left a message for a call back. Need to provide her with new Infusion details:  [place] at [facility] [address] [place], [place] [address]

## 2020-04-03 NOTE — Telephone Encounter (Signed)
Crystal returned call and was provided patients new Infusion location and address. Crystal stated she will get patient updated and scheduled there for her April infusion.

## 2020-04-09 ENCOUNTER — Telehealth: Payer: Self-pay

## 2020-04-09 NOTE — Telephone Encounter (Signed)
Peggy Banks returned my call from the lab and informed me that she called [facility] and they have received the patients blood vial for the JC virus antibody titer. She informed me that [facility] stated the blood sample is sent to [place] and it takes 15 days (not including weekends) to receive results.

## 2020-04-09 NOTE — Telephone Encounter (Signed)
-----   Message from Alda Berthold, DO sent at 04/09/2020 11:05 AM EDT ----- Can you follow-up on her JC virus antibody titers?  They have still not resulted. Thanks.

## 2020-04-11 LAB — STRATIFY JCV AB (W/ INDEX) W/ RFLX
Index Value: 0.26 — ABNORMAL HIGH
Stratify JCV (TM) Ab w/Reflex Inhibition: UNDETERMINED — AB

## 2020-04-11 LAB — RFLX STRATIFY JCV (TM) AB INHIBITION: JCV Antibody by Inhibition: NEGATIVE

## 2020-04-20 ENCOUNTER — Ambulatory Visit
Admission: RE | Admit: 2020-04-20 | Discharge: 2020-04-20 | Disposition: A | Discharge status: Home / Self Care | Payer: Managed Care, Other (non HMO) | Source: Ambulatory Visit | Attending: Neurology | Admitting: Neurology

## 2020-04-20 ENCOUNTER — Other Ambulatory Visit: Payer: Self-pay

## 2020-04-20 DIAGNOSIS — G35 Multiple sclerosis: Secondary | ICD-10-CM

## 2020-04-20 MED ORDER — GADOBENATE DIMEGLUMINE 529 MG/ML IV SOLN
20.0000 mL | Freq: Once | INTRAVENOUS | Status: AC | PRN
  Administered 2020-04-20: 20 mL via INTRAVENOUS

## 2020-04-22 ENCOUNTER — Telehealth: Payer: Self-pay

## 2020-04-22 NOTE — Telephone Encounter (Signed)
-----   Message from Alda Berthold, DO sent at 04/22/2020  8:43 AM EDT ----- Please let pt know that her MRI brain and cervical spine is overall stable.  Continue Tysabri as planned.

## 2020-04-22 NOTE — Telephone Encounter (Signed)
Called patient and informed her of results. Patient verbalized understanding and had no questions or concerns.

## 2020-04-23 ENCOUNTER — Telehealth: Payer: Self-pay | Admitting: Pharmacy Technician

## 2020-04-23 NOTE — Telephone Encounter (Signed)
Auth Submission: Payer: [employer] Medication & CPT/J Code(s) submitted: Tysabri (Natalizumab) [id number] Route of submission PHONE [phone] Units/visits requested: 12 Reference number: [id number] Requested to be expedited.  Will update once we receive a response.

## 2020-04-24 ENCOUNTER — Telehealth: Payer: Self-pay | Admitting: Neurology

## 2020-04-24 NOTE — Telephone Encounter (Signed)
Hey so for this patient the [facility] is not covered through her insurance for the Tysabri medication. She will need to continue to use [facility] and they will need to get the medication through her specialty pharmacy [facility] like they have been doing. That is covered through insurance. That way they will be able to renew the medication for this year.   Just FYI. I'm not sure if you will need to change orders for this or anything. Thanks!

## 2020-04-25 NOTE — Telephone Encounter (Signed)
Received notification from [facility] regarding a prior authorization for TYSABRI (JCODE: [id number]). Authorization has been APPROVED from 04/23/20 to 04/23/21.   Authorization # [id number] Phone # [phone]  Will reach out to scheduling to have patient scheduled asap.

## 2020-04-26 NOTE — Telephone Encounter (Signed)
Patient's insurance did approve her Tysabri to be provided via [facility] and provide infusion services at [facility].  Patient has been called and confirmed appointment for next (and future) infusions on 05/02/20 at [place].  Orpah Cobb

## 2020-04-30 ENCOUNTER — Ambulatory Visit: Payer: Managed Care, Other (non HMO)

## 2020-05-02 ENCOUNTER — Ambulatory Visit (INDEPENDENT_AMBULATORY_CARE_PROVIDER_SITE_OTHER): Payer: Managed Care, Other (non HMO) | Admitting: *Deleted

## 2020-05-02 ENCOUNTER — Other Ambulatory Visit: Payer: Self-pay

## 2020-05-02 VITALS — BP 123/81 | HR 80 | Temp 97.9°F | Resp 18

## 2020-05-02 DIAGNOSIS — G35 Multiple sclerosis: Secondary | ICD-10-CM | POA: Diagnosis not present

## 2020-05-02 MED ORDER — SODIUM CHLORIDE 0.9% FLUSH: 3.0000 mL | Freq: Once | INTRAVENOUS | Status: DC | PRN

## 2020-05-02 MED ORDER — SODIUM CHLORIDE 0.9 % IV SOLN: INTRAVENOUS | Status: DC

## 2020-05-02 MED ORDER — ANTICOAGULANT SODIUM CITRATE 4% (200MG/5ML) IV SOLN
5.0000 mL | Freq: Once | Status: DC | PRN
  Filled 2020-05-02: qty 5

## 2020-05-02 MED ORDER — SODIUM CHLORIDE 0.9 % IV SOLN
300.0000 mg | Freq: Once | INTRAVENOUS | Status: AC
Start: 2020-05-02 — End: 2020-05-02
  Administered 2020-05-02: 300 mg via INTRAVENOUS
  Filled 2020-05-02: qty 15

## 2020-05-02 MED ORDER — HEPARIN SOD (PORK) LOCK FLUSH 100 UNIT/ML IV SOLN: 500.0000 [IU] | Freq: Once | INTRAVENOUS | Status: DC | PRN

## 2020-05-02 MED ORDER — HEPARIN SOD (PORK) LOCK FLUSH 100 UNIT/ML IV SOLN: 250.0000 [IU] | Freq: Once | INTRAVENOUS | Status: DC | PRN

## 2020-05-02 MED ORDER — LORATADINE 10 MG PO TABS: 10.0000 mg | ORAL_TABLET | Freq: Once | ORAL | Status: DC

## 2020-05-02 MED ORDER — SODIUM CHLORIDE 0.9% FLUSH: 10.0000 mL | Freq: Once | INTRAVENOUS | Status: DC | PRN

## 2020-05-02 MED ORDER — ACETAMINOPHEN 325 MG PO TABS: 650.0000 mg | ORAL_TABLET | Freq: Once | ORAL | Status: DC

## 2020-05-02 MED ORDER — ALTEPLASE 2 MG IJ SOLR: 2.0000 mg | Freq: Once | INTRAMUSCULAR | Status: DC | PRN

## 2020-05-02 NOTE — Progress Notes (Signed)
Diagnosis: Multiple Sclerosis  Provider:  Marshell Garfinkel, MD  Procedure: Infusion  IV Type: Peripheral, IV Location: L Antecubital  Tysabri (Natalizumab), Dose: 300 mg  Infusion Start Time: 6256  Infusion Stop Time: 3893  Post Infusion IV Care: Observation period completed  Discharge: Condition: Good, Destination: Home . AVS provided to patient.   Performed by:  Ludwig Lean, RN

## 2020-05-28 ENCOUNTER — Ambulatory Visit (INDEPENDENT_AMBULATORY_CARE_PROVIDER_SITE_OTHER): Payer: Managed Care, Other (non HMO)

## 2020-05-28 ENCOUNTER — Other Ambulatory Visit: Payer: Self-pay

## 2020-05-28 VITALS — BP 110/77 | HR 82 | Temp 98.1°F | Resp 20

## 2020-05-28 DIAGNOSIS — G35 Multiple sclerosis: Secondary | ICD-10-CM | POA: Diagnosis not present

## 2020-05-28 MED ORDER — ACETAMINOPHEN 325 MG PO TABS: 650.0000 mg | ORAL_TABLET | Freq: Once | ORAL | Status: DC

## 2020-05-28 MED ORDER — ALTEPLASE 2 MG IJ SOLR: 2.0000 mg | Freq: Once | INTRAMUSCULAR | Status: DC | PRN

## 2020-05-28 MED ORDER — SODIUM CHLORIDE 0.9 % IV SOLN: INTRAVENOUS | Status: DC

## 2020-05-28 MED ORDER — SODIUM CHLORIDE 0.9 % IV SOLN
300.0000 mg | Freq: Once | INTRAVENOUS | Status: AC
  Administered 2020-05-28: 300 mg via INTRAVENOUS
  Filled 2020-05-28: qty 15

## 2020-05-28 MED ORDER — SODIUM CHLORIDE 0.9% FLUSH: 10.0000 mL | Freq: Once | INTRAVENOUS | Status: DC | PRN

## 2020-05-28 MED ORDER — LORATADINE 10 MG PO TABS: 10.0000 mg | ORAL_TABLET | Freq: Once | ORAL | Status: DC

## 2020-05-28 MED ORDER — SODIUM CHLORIDE 0.9% FLUSH: 3.0000 mL | Freq: Once | INTRAVENOUS | Status: DC | PRN

## 2020-05-28 MED ORDER — HEPARIN SOD (PORK) LOCK FLUSH 100 UNIT/ML IV SOLN: 250.0000 [IU] | Freq: Once | INTRAVENOUS | Status: DC | PRN

## 2020-05-28 MED ORDER — ANTICOAGULANT SODIUM CITRATE 4% (200MG/5ML) IV SOLN
5.0000 mL | Freq: Once | Status: DC | PRN
  Filled 2020-05-28: qty 5

## 2020-05-28 MED ORDER — HEPARIN SOD (PORK) LOCK FLUSH 100 UNIT/ML IV SOLN: 500.0000 [IU] | Freq: Once | INTRAVENOUS | Status: DC | PRN

## 2020-05-28 NOTE — Progress Notes (Signed)
Diagnosis:Multiple Sclerosis  Provider:  Marshell Garfinkel, MD  Procedure: Infusion  IV Type: Peripheral, IV Location: L Hand  Tysabri (Natalizumab), Dose: 300 mg  Infusion Start Time: 0937  Infusion Stop Time: 8828  Post Infusion IV Care: Patient declined observation and Peripheral IV Discontinued  Discharge: Condition: Good, Destination: Home . AVS provided to patient.   Performed by:  Arnoldo Morale, RN

## 2020-06-25 ENCOUNTER — Other Ambulatory Visit: Payer: Self-pay

## 2020-06-25 ENCOUNTER — Ambulatory Visit (INDEPENDENT_AMBULATORY_CARE_PROVIDER_SITE_OTHER): Payer: Managed Care, Other (non HMO)

## 2020-06-25 VITALS — BP 123/85 | HR 72 | Temp 97.7°F | Resp 20

## 2020-06-25 DIAGNOSIS — G35 Multiple sclerosis: Secondary | ICD-10-CM | POA: Diagnosis not present

## 2020-06-25 MED ORDER — SODIUM CHLORIDE 0.9 % IV SOLN: INTRAVENOUS | Status: DC

## 2020-06-25 MED ORDER — FAMOTIDINE IN NACL 20-0.9 MG/50ML-% IV SOLN: 20.0000 mg | Freq: Once | INTRAVENOUS | Status: DC | PRN

## 2020-06-25 MED ORDER — SODIUM CHLORIDE 0.9% FLUSH: 3.0000 mL | Freq: Once | INTRAVENOUS | Status: DC | PRN

## 2020-06-25 MED ORDER — METHYLPREDNISOLONE SODIUM SUCC 125 MG IJ SOLR: 125.0000 mg | Freq: Once | INTRAMUSCULAR | Status: DC | PRN

## 2020-06-25 MED ORDER — HEPARIN SOD (PORK) LOCK FLUSH 100 UNIT/ML IV SOLN: 250.0000 [IU] | Freq: Once | INTRAVENOUS | Status: DC | PRN

## 2020-06-25 MED ORDER — LORATADINE 10 MG PO TABS
10.0000 mg | ORAL_TABLET | Freq: Once | ORAL | Status: AC
  Administered 2020-06-25: 10 mg via ORAL
  Filled 2020-06-25: qty 1

## 2020-06-25 MED ORDER — NATALIZUMAB 300 MG/15ML IV CONC
300.0000 mg | Freq: Once | INTRAVENOUS | Status: AC
  Administered 2020-06-25: 300 mg via INTRAVENOUS
  Filled 2020-06-25 (×2): qty 15

## 2020-06-25 MED ORDER — ALTEPLASE 2 MG IJ SOLR: 2.0000 mg | Freq: Once | INTRAMUSCULAR | Status: DC | PRN

## 2020-06-25 MED ORDER — EPINEPHRINE 0.3 MG/0.3ML IJ SOAJ: 0.3000 mg | Freq: Once | INTRAMUSCULAR | Status: DC | PRN

## 2020-06-25 MED ORDER — HEPARIN SOD (PORK) LOCK FLUSH 100 UNIT/ML IV SOLN: 500.0000 [IU] | Freq: Once | INTRAVENOUS | Status: DC | PRN

## 2020-06-25 MED ORDER — ANTICOAGULANT SODIUM CITRATE 4% (200MG/5ML) IV SOLN: 5.0000 mL | Freq: Once | Status: DC | PRN

## 2020-06-25 MED ORDER — SODIUM CHLORIDE 0.9% FLUSH: 10.0000 mL | Freq: Once | INTRAVENOUS | Status: DC | PRN

## 2020-06-25 MED ORDER — ALBUTEROL SULFATE HFA 108 (90 BASE) MCG/ACT IN AERS: 2.0000 | INHALATION_SPRAY | Freq: Once | RESPIRATORY_TRACT | Status: DC | PRN

## 2020-06-25 MED ORDER — DIPHENHYDRAMINE HCL 50 MG/ML IJ SOLN: 50.0000 mg | Freq: Once | INTRAMUSCULAR | Status: DC | PRN

## 2020-06-25 MED ORDER — SODIUM CHLORIDE 0.9 % IV SOLN: Freq: Once | INTRAVENOUS | Status: DC | PRN

## 2020-06-25 MED ORDER — ACETAMINOPHEN 325 MG PO TABS
650.0000 mg | ORAL_TABLET | Freq: Once | ORAL | Status: AC
  Administered 2020-06-25: 650 mg via ORAL
  Filled 2020-06-25: qty 2

## 2020-06-25 NOTE — Progress Notes (Signed)
Diagnosis: Multiple Sclerosis  Provider:  Marshell Garfinkel, MD  Procedure: Infusion  IV Type: Peripheral, IV Location: R Hand  Tysabri (Natalizumab), Dose: 300 mg  Infusion Start Time: 7276  Infusion Stop Time: 1848  Post Infusion IV Care: Patient declined observation and Peripheral IV Discontinued  Discharge: Condition: Good, Destination: Home . AVS provided to patient.   Performed by:  Koren Shiver, RN

## 2020-07-23 ENCOUNTER — Ambulatory Visit: Payer: Managed Care, Other (non HMO)

## 2020-07-30 ENCOUNTER — Other Ambulatory Visit: Payer: Self-pay

## 2020-07-30 ENCOUNTER — Ambulatory Visit (INDEPENDENT_AMBULATORY_CARE_PROVIDER_SITE_OTHER): Payer: Managed Care, Other (non HMO)

## 2020-07-30 VITALS — BP 111/73 | HR 74 | Temp 98.0°F | Resp 16 | Ht 62.0 in | Wt 214.6 lb

## 2020-07-30 DIAGNOSIS — G35 Multiple sclerosis: Secondary | ICD-10-CM | POA: Diagnosis not present

## 2020-07-30 MED ORDER — LORATADINE 10 MG PO TABS
10.0000 mg | ORAL_TABLET | Freq: Once | ORAL | Status: AC
  Administered 2020-07-30: 10 mg via ORAL
  Filled 2020-07-30: qty 1

## 2020-07-30 MED ORDER — ANTICOAGULANT SODIUM CITRATE 4% (200MG/5ML) IV SOLN
5.0000 mL | Freq: Once | Status: DC | PRN
  Filled 2020-07-30: qty 5

## 2020-07-30 MED ORDER — ALTEPLASE 2 MG IJ SOLR: 2.0000 mg | Freq: Once | INTRAMUSCULAR | Status: DC | PRN

## 2020-07-30 MED ORDER — HEPARIN SOD (PORK) LOCK FLUSH 100 UNIT/ML IV SOLN
500.0000 [IU] | Freq: Once | INTRAVENOUS | Status: DC | PRN
Start: 2020-07-30 — End: 2020-07-30

## 2020-07-30 MED ORDER — SODIUM CHLORIDE 0.9 % IV SOLN: INTRAVENOUS | Status: DC

## 2020-07-30 MED ORDER — SODIUM CHLORIDE 0.9% FLUSH: 3.0000 mL | Freq: Once | INTRAVENOUS | Status: DC | PRN

## 2020-07-30 MED ORDER — ACETAMINOPHEN 325 MG PO TABS
650.0000 mg | ORAL_TABLET | Freq: Once | ORAL | Status: AC
  Administered 2020-07-30: 650 mg via ORAL
  Filled 2020-07-30: qty 2

## 2020-07-30 MED ORDER — HEPARIN SOD (PORK) LOCK FLUSH 100 UNIT/ML IV SOLN: 250.0000 [IU] | Freq: Once | INTRAVENOUS | Status: DC | PRN

## 2020-07-30 MED ORDER — SODIUM CHLORIDE 0.9% FLUSH
10.0000 mL | Freq: Once | INTRAVENOUS | Status: DC | PRN
Start: 2020-07-30 — End: 2020-07-30

## 2020-07-30 MED ORDER — SODIUM CHLORIDE 0.9 % IV SOLN
300.0000 mg | Freq: Once | INTRAVENOUS | Status: AC
  Administered 2020-07-30: 300 mg via INTRAVENOUS
  Filled 2020-07-30: qty 15

## 2020-07-30 NOTE — Progress Notes (Signed)
Diagnosis: Crohn's Disease  Provider:  Marshell Garfinkel, MD  Procedure: Infusion  IV Type: Peripheral, IV Location: L Antecubital  Tysabri (Natalizumab), Dose: 300 mg  Infusion Start Time: 0930  Infusion Stop Time: [id number]  Post Infusion IV Care: Patient declined observation  Discharge: Condition: Good, Destination: Home . AVS declined  per patient.   Performed by:  Oneal Schoenberger, Sherlon Handing, LPN

## 2020-08-20 ENCOUNTER — Ambulatory Visit: Payer: Managed Care, Other (non HMO)

## 2020-08-27 ENCOUNTER — Ambulatory Visit: Payer: Managed Care, Other (non HMO)

## 2020-08-28 ENCOUNTER — Ambulatory Visit (INDEPENDENT_AMBULATORY_CARE_PROVIDER_SITE_OTHER): Payer: Managed Care, Other (non HMO)

## 2020-08-28 ENCOUNTER — Other Ambulatory Visit: Payer: Self-pay

## 2020-08-28 VITALS — BP 137/86 | HR 83 | Temp 97.6°F | Resp 18

## 2020-08-28 DIAGNOSIS — G35 Multiple sclerosis: Secondary | ICD-10-CM | POA: Diagnosis not present

## 2020-08-28 MED ORDER — HEPARIN SOD (PORK) LOCK FLUSH 100 UNIT/ML IV SOLN: 500.0000 [IU] | Freq: Once | INTRAVENOUS | Status: DC | PRN

## 2020-08-28 MED ORDER — ANTICOAGULANT SODIUM CITRATE 4% (200MG/5ML) IV SOLN
5.0000 mL | Freq: Once | Status: DC | PRN
  Filled 2020-08-28: qty 5

## 2020-08-28 MED ORDER — LORATADINE 10 MG PO TABS: 10.0000 mg | ORAL_TABLET | Freq: Once | ORAL | Status: DC

## 2020-08-28 MED ORDER — ACETAMINOPHEN 325 MG PO TABS: 650.0000 mg | ORAL_TABLET | Freq: Once | ORAL | Status: DC

## 2020-08-28 MED ORDER — ALTEPLASE 2 MG IJ SOLR: 2.0000 mg | Freq: Once | INTRAMUSCULAR | Status: DC | PRN

## 2020-08-28 MED ORDER — HEPARIN SOD (PORK) LOCK FLUSH 100 UNIT/ML IV SOLN: 250.0000 [IU] | Freq: Once | INTRAVENOUS | Status: DC | PRN

## 2020-08-28 MED ORDER — SODIUM CHLORIDE 0.9% FLUSH: 10.0000 mL | Freq: Once | INTRAVENOUS | Status: DC | PRN

## 2020-08-28 MED ORDER — SODIUM CHLORIDE 0.9 % IV SOLN
300.0000 mg | Freq: Once | INTRAVENOUS | Status: AC
  Administered 2020-08-28: 300 mg via INTRAVENOUS
  Filled 2020-08-28: qty 15

## 2020-08-28 MED ORDER — SODIUM CHLORIDE 0.9% FLUSH: 3.0000 mL | Freq: Once | INTRAVENOUS | Status: DC | PRN

## 2020-08-28 MED ORDER — SODIUM CHLORIDE 0.9 % IV SOLN: INTRAVENOUS | Status: DC

## 2020-08-28 NOTE — Patient Instructions (Signed)
Natalizumab injection What is this medication? NATALIZUMAB (na ta LIZ you mab) is used to treat relapsing multiple sclerosis.This drug is not a cure. It is also used to treat Crohn's disease. This medicine may be used for other purposes; ask your health care provider orpharmacist if you have questions. COMMON BRAND NAME(S): Tysabri What should I tell my care team before I take this medication? They need to know if you have any of these conditions: immune system problems progressive multifocal leukoencephalopathy (PML) an unusual or allergic reaction to natalizumab, other medicines, foods, dyes, or preservatives pregnant or trying to get pregnant breast-feeding How should I use this medication? This medicine is for infusion into a vein. It is given by a health careprofessional in a hospital or clinic setting. A special MedGuide will be given to you by the pharmacist with eachprescription and refill. Be sure to read this information carefully each time. Talk to your pediatrician regarding the use of this medicine in children. Thismedicine is not approved for use in children. Overdosage: If you think you have taken too much of this medicine contact apoison control center or emergency room at once. NOTE: This medicine is only for you. Do not share this medicine with others. What if I miss a dose? It is important not to miss your dose. Call your doctor or health careprofessional if you are unable to keep an appointment. What may interact with this medication? Do not take this medicine with any of the following medications: biologic medicines such as adalimumab, certolizumab, etanercept, golimumab, infliximab This medicine may also interact with the following medications: azathioprine cyclosporine interferons 6-mercaptopurine methotrexate other medicines that lower your chance of fighting an infection steroid medicines like prednisone or cortisone vaccines This list may not describe all  possible interactions. Give your health care provider a list of all the medicines, herbs, non-prescription drugs, or dietary supplements you use. Also tell them if you smoke, drink alcohol, or use illegaldrugs. Some items may interact with your medicine. What should I watch for while using this medication? Your condition will be monitored carefully while you are receiving this medicine. Visit your doctor for regular check ups. Tell your doctor or healthcare professional if your symptoms do not start to get better or if theyget worse. Stay away from people who are sick. Call your doctor or health care professional for advice if you get a fever, chills or sore throat, or othersymptoms of a cold or flu. Do not treat yourself. In some patients, this medicine may cause a serious brain infection that may cause death. If you have any problems seeing, thinking, speaking, walking, or standing, tell your doctor right away. If you cannot reach your doctor, geturgent medical care. What side effects may I notice from receiving this medication? Side effects that you should report to your doctor or health care professionalas soon as possible: allergic reactions like skin rash, itching or hives, swelling of the face, lips, or tongue breathing problems changes in vision chest pain confusion depressed mood dizziness feeling faint; lightheaded; falls general ill feeling or flu-like symptoms loss of memory missed menstrual periods muscle weakness problems with balance, talking, or walking signs and symptoms of liver injury like dark yellow or brown urine; general ill feeling or flu-like symptoms; light-colored stools; loss of appetite; nausea; right upper belly pain; unusually weak or tired; yellowing of the eyes or skin suicidal thoughts, mood changes unusual bruising or bleeding unusually weak or tired Side effects that usually do not require medical attention (report  to yourdoctor or health care professional  if they continue or are bothersome): headache joint pain muscle cramps muscle pain nausea, vomiting pain, redness, or irritation at site where injected tiredness This list may not describe all possible side effects. Call your doctor for medical advice about side effects. You may report side effects to FDA at1-800-FDA-1088. Where should I keep my medication? This drug is given in a hospital or clinic and will not be stored at home. NOTE: This sheet is a summary. It may not cover all possible information. If you have questions about this medicine, talk to your doctor, pharmacist, orhealth care provider.  2022 Elsevier/Gold Standard (2018-06-27 13:20:26)

## 2020-08-28 NOTE — Progress Notes (Signed)
Diagnosis: Multiple Sclerosis  Provider:  Marshell Garfinkel, MD  Procedure: Infusion  IV Type: Peripheral, IV Location: L Antecubital  Tysabri (Natalizumab), Dose: 300 mg  Infusion Start Time: [id number]  Infusion Stop Time: 1050  Post Infusion IV Care: Patient declined observation and Peripheral IV Discontinued  Discharge: Condition: Good, Destination: Home . AVS provided to patient.   Performed by:  Koren Shiver, RN

## 2020-08-31 ENCOUNTER — Other Ambulatory Visit: Payer: Self-pay | Admitting: Neurology

## 2020-09-17 ENCOUNTER — Ambulatory Visit: Payer: Managed Care, Other (non HMO)

## 2020-09-24 ENCOUNTER — Ambulatory Visit (INDEPENDENT_AMBULATORY_CARE_PROVIDER_SITE_OTHER): Payer: Managed Care, Other (non HMO)

## 2020-09-24 ENCOUNTER — Other Ambulatory Visit: Payer: Self-pay

## 2020-09-24 VITALS — BP 127/84 | HR 75 | Temp 97.9°F | Resp 16 | Ht 62.0 in | Wt 215.0 lb

## 2020-09-24 DIAGNOSIS — G35 Multiple sclerosis: Secondary | ICD-10-CM

## 2020-09-24 MED ORDER — SODIUM CHLORIDE 0.9 % IV SOLN
300.0000 mg | Freq: Once | INTRAVENOUS | Status: AC
  Administered 2020-09-24: 300 mg via INTRAVENOUS
  Filled 2020-09-24: qty 15

## 2020-09-24 MED ORDER — LORATADINE 10 MG PO TABS: 10.0000 mg | ORAL_TABLET | Freq: Once | ORAL | Status: DC

## 2020-09-24 MED ORDER — ACETAMINOPHEN 325 MG PO TABS: 650.0000 mg | ORAL_TABLET | Freq: Once | ORAL | Status: DC

## 2020-09-24 MED ORDER — SODIUM CHLORIDE 0.9 % IV SOLN: INTRAVENOUS | Status: DC

## 2020-09-24 MED ORDER — ALBUTEROL SULFATE HFA 108 (90 BASE) MCG/ACT IN AERS: 2.0000 | INHALATION_SPRAY | Freq: Once | RESPIRATORY_TRACT | Status: DC | PRN

## 2020-09-24 MED ORDER — EPINEPHRINE 0.3 MG/0.3ML IJ SOAJ: 0.3000 mg | Freq: Once | INTRAMUSCULAR | Status: DC | PRN

## 2020-09-24 MED ORDER — DIPHENHYDRAMINE HCL 50 MG/ML IJ SOLN: 50.0000 mg | Freq: Once | INTRAMUSCULAR | Status: DC | PRN

## 2020-09-24 MED ORDER — METHYLPREDNISOLONE SODIUM SUCC 125 MG IJ SOLR: 125.0000 mg | Freq: Once | INTRAMUSCULAR | Status: DC | PRN

## 2020-09-24 MED ORDER — FAMOTIDINE IN NACL 20-0.9 MG/50ML-% IV SOLN: 20.0000 mg | Freq: Once | INTRAVENOUS | Status: DC | PRN

## 2020-09-24 MED ORDER — SODIUM CHLORIDE 0.9 % IV SOLN: Freq: Once | INTRAVENOUS | Status: DC | PRN

## 2020-09-24 NOTE — Progress Notes (Signed)
Diagnosis: Multiple Sclerosis  Provider:  Marshell Garfinkel, MD  Procedure: Infusion  IV Type: Peripheral, IV Location: L Hand  Tysabri (Natalizumab), Dose: 300 mg  Infusion Start Time: 7510  Infusion Stop Time: 10.53  Post Infusion IV Care: Patient declined observation and Peripheral IV Discontinued  Discharge: Condition: Good, Destination: Home . AVS provided to patient.   Performed by:  Randall Colden, Sherlon Handing, LPN

## 2020-10-07 ENCOUNTER — Other Ambulatory Visit: Payer: Self-pay | Admitting: Neurology

## 2020-10-15 ENCOUNTER — Ambulatory Visit: Payer: Managed Care, Other (non HMO)

## 2020-10-22 ENCOUNTER — Ambulatory Visit (INDEPENDENT_AMBULATORY_CARE_PROVIDER_SITE_OTHER): Payer: Managed Care, Other (non HMO)

## 2020-10-22 ENCOUNTER — Other Ambulatory Visit: Payer: Self-pay

## 2020-10-22 VITALS — BP 110/74 | HR 75 | Temp 97.8°F | Resp 16 | Ht 62.0 in | Wt 217.0 lb

## 2020-10-22 DIAGNOSIS — G35 Multiple sclerosis: Secondary | ICD-10-CM

## 2020-10-22 MED ORDER — SODIUM CHLORIDE 0.9% FLUSH: 3.0000 mL | Freq: Once | INTRAVENOUS | Status: DC | PRN

## 2020-10-22 MED ORDER — EPINEPHRINE 0.3 MG/0.3ML IJ SOAJ: 0.3000 mg | Freq: Once | INTRAMUSCULAR | Status: DC | PRN

## 2020-10-22 MED ORDER — ACETAMINOPHEN 325 MG PO TABS: 650.0000 mg | ORAL_TABLET | Freq: Once | ORAL | Status: DC

## 2020-10-22 MED ORDER — ALBUTEROL SULFATE HFA 108 (90 BASE) MCG/ACT IN AERS: 2.0000 | INHALATION_SPRAY | Freq: Once | RESPIRATORY_TRACT | Status: DC | PRN

## 2020-10-22 MED ORDER — ALTEPLASE 2 MG IJ SOLR: 2.0000 mg | Freq: Once | INTRAMUSCULAR | Status: DC | PRN

## 2020-10-22 MED ORDER — ANTICOAGULANT SODIUM CITRATE 4% (200MG/5ML) IV SOLN
5.0000 mL | Freq: Once | Status: DC | PRN
  Filled 2020-10-22: qty 5

## 2020-10-22 MED ORDER — METHYLPREDNISOLONE SODIUM SUCC 125 MG IJ SOLR: 125.0000 mg | Freq: Once | INTRAMUSCULAR | Status: DC | PRN

## 2020-10-22 MED ORDER — LORATADINE 10 MG PO TABS: 10.0000 mg | ORAL_TABLET | Freq: Once | ORAL | Status: DC

## 2020-10-22 MED ORDER — HEPARIN SOD (PORK) LOCK FLUSH 100 UNIT/ML IV SOLN: 250.0000 [IU] | Freq: Once | INTRAVENOUS | Status: DC | PRN

## 2020-10-22 MED ORDER — SODIUM CHLORIDE 0.9 % IV SOLN: Freq: Once | INTRAVENOUS | Status: DC | PRN

## 2020-10-22 MED ORDER — DIPHENHYDRAMINE HCL 50 MG/ML IJ SOLN: 50.0000 mg | Freq: Once | INTRAMUSCULAR | Status: DC | PRN

## 2020-10-22 MED ORDER — SODIUM CHLORIDE 0.9% FLUSH: 10.0000 mL | Freq: Once | INTRAVENOUS | Status: DC | PRN

## 2020-10-22 MED ORDER — SODIUM CHLORIDE 0.9 % IV SOLN
300.0000 mg | Freq: Once | INTRAVENOUS | Status: AC
  Administered 2020-10-22: 300 mg via INTRAVENOUS
  Filled 2020-10-22: qty 15

## 2020-10-22 MED ORDER — FAMOTIDINE IN NACL 20-0.9 MG/50ML-% IV SOLN: 20.0000 mg | Freq: Once | INTRAVENOUS | Status: DC | PRN

## 2020-10-22 MED ORDER — SODIUM CHLORIDE 0.9 % IV SOLN: INTRAVENOUS | Status: DC

## 2020-10-22 MED ORDER — HEPARIN SOD (PORK) LOCK FLUSH 100 UNIT/ML IV SOLN
500.0000 [IU] | Freq: Once | INTRAVENOUS | Status: DC | PRN
Start: 2020-10-22 — End: 2020-10-22

## 2020-10-22 NOTE — Progress Notes (Signed)
Diagnosis: Multiple Sclerosis  Provider:  Marshell Garfinkel, MD  Procedure: Infusion  IV Type: Peripheral, IV Location: L Hand  Tysabri (Natalizumab), Dose: 300 mg  Infusion Start Time: 2119  Infusion Stop Time: 4174  Post Infusion IV Care: Peripheral IV Discontinued PT DECLINED OBSERVATION.  Discharge: Condition: Good, Destination: Home . AVS provided to patient.   Performed by:  Larna Capelle, Sherlon Handing, LPN

## 2020-11-07 ENCOUNTER — Telehealth: Payer: Self-pay | Admitting: Pharmacy Technician

## 2020-11-07 NOTE — Telephone Encounter (Addendum)
Dr. Posey Pronto,   [employer] has faxed a Tysabri Re-authorization questionnaire form to your office for patient to continue in the Patient Assistance Program. The form is missing information, (section G). To prevent delay in treatment please complete and re-fax forms.  Per [employer], I will not be able to arrange shipment of medication from the PAP until the forms are completed.  Patient next scheduled treatment: 11/19/20  Thanks Kim.

## 2020-11-08 NOTE — Telephone Encounter (Signed)
Please let me know when forms have been completed and faxed (missing info-section G) so I can set up delivery of her Tysabri. Ty

## 2020-11-08 NOTE — Telephone Encounter (Signed)
The fax number is correct. I will be on the look out for paperwork to come. Thank you Joelene Millin.

## 2020-11-08 NOTE — Telephone Encounter (Signed)
I have checked all my papers and do not see anything from [facility] [place].

## 2020-11-08 NOTE — Telephone Encounter (Signed)
Have spoken with [employer] and forms will be re-faxed to [phone]. Rep: [email or web address]  [email or web address]  please verify fax number Thanks

## 2020-11-11 ENCOUNTER — Other Ambulatory Visit: Payer: Self-pay

## 2020-11-11 ENCOUNTER — Telehealth: Payer: Self-pay

## 2020-11-11 DIAGNOSIS — Z79899 Other long term (current) drug therapy: Secondary | ICD-10-CM

## 2020-11-11 DIAGNOSIS — G35 Multiple sclerosis: Secondary | ICD-10-CM

## 2020-11-11 NOTE — Telephone Encounter (Signed)
Peggy Banks, I have faxed her paperwork to [facility] and made sure section G was filled out. Also, patient is coming in tomorrow to have her JCV antibodies lab work done as well. Let me know if you need anything else.

## 2020-11-11 NOTE — Telephone Encounter (Signed)
Called patient and informed her that per Dr. Posey Pronto ok to have labs done after her MRI. Patient verbalized understanding and had no further questions or concerns.

## 2020-11-11 NOTE — Telephone Encounter (Signed)
Called and informed patient that she is due for lab work for her JCV antibodies. Patient stated she can do it tomorrow after her Mammogram. Patient wanted to know if it was ok to get labs done after her mammogram. She wants to ensure her mammogram scanning will not interfere with labs. Informed patient that I will ask Dr. Posey Pronto and let her know.

## 2020-11-12 ENCOUNTER — Other Ambulatory Visit: Payer: Self-pay | Admitting: Internal Medicine

## 2020-11-12 ENCOUNTER — Ambulatory Visit
Admission: RE | Admit: 2020-11-12 | Discharge: 2020-11-12 | Disposition: A | Discharge status: Home / Self Care | Payer: Managed Care, Other (non HMO) | Source: Ambulatory Visit | Attending: Internal Medicine | Admitting: Internal Medicine

## 2020-11-12 ENCOUNTER — Telehealth: Payer: Self-pay | Admitting: Neurology

## 2020-11-12 DIAGNOSIS — R928 Other abnormal and inconclusive findings on diagnostic imaging of breast: Secondary | ICD-10-CM

## 2020-11-12 DIAGNOSIS — Z1231 Encounter for screening mammogram for malignant neoplasm of breast: Secondary | ICD-10-CM

## 2020-11-12 NOTE — Telephone Encounter (Signed)
Pt stated the form that was sent in from [facility]- is missing section G. That needs to be done to [facility]

## 2020-11-12 NOTE — Telephone Encounter (Signed)
Form has been faxed and section G has been completed.

## 2020-11-13 ENCOUNTER — Ambulatory Visit: Payer: Managed Care, Other (non HMO)

## 2020-11-13 NOTE — Telephone Encounter (Signed)
F/u FYI NOTE: All paperwork have been completed, medication has been ordered, and patient is scheduled for treatment on 11/19/20. Ty Kim.

## 2020-11-19 ENCOUNTER — Other Ambulatory Visit: Payer: Self-pay

## 2020-11-19 ENCOUNTER — Other Ambulatory Visit (INDEPENDENT_AMBULATORY_CARE_PROVIDER_SITE_OTHER): Payer: Managed Care, Other (non HMO)

## 2020-11-19 ENCOUNTER — Ambulatory Visit (INDEPENDENT_AMBULATORY_CARE_PROVIDER_SITE_OTHER): Payer: Managed Care, Other (non HMO)

## 2020-11-19 VITALS — BP 108/72 | HR 82 | Temp 98.6°F | Resp 20 | Ht 62.0 in | Wt 217.8 lb

## 2020-11-19 DIAGNOSIS — G35 Multiple sclerosis: Secondary | ICD-10-CM

## 2020-11-19 DIAGNOSIS — Z79899 Other long term (current) drug therapy: Secondary | ICD-10-CM

## 2020-11-19 LAB — COMPREHENSIVE METABOLIC PANEL WITH GFR
ALT: 13 U/L (ref 0–35)
AST: 13 U/L (ref 0–37)
Albumin: 4.3 g/dL (ref 3.5–5.2)
Alkaline Phosphatase: 40 U/L (ref 39–117)
BUN: 9 mg/dL (ref 6–23)
CO2: 28 meq/L (ref 19–32)
Calcium: 9.3 mg/dL (ref 8.4–10.5)
Chloride: 103 meq/L (ref 96–112)
Creatinine, Ser: 0.61 mg/dL (ref 0.40–1.20)
GFR: 112.14 mL/min
Glucose, Bld: 101 mg/dL — ABNORMAL HIGH (ref 70–99)
Potassium: 3.7 meq/L (ref 3.5–5.1)
Sodium: 138 meq/L (ref 135–145)
Total Bilirubin: 0.3 mg/dL (ref 0.2–1.2)
Total Protein: 7.5 g/dL (ref 6.0–8.3)

## 2020-11-19 LAB — CBC WITH DIFFERENTIAL/PLATELET
Basophils Absolute: 0 K/uL (ref 0.0–0.1)
Basophils Relative: 0.3 % (ref 0.0–3.0)
Eosinophils Absolute: 0.1 K/uL (ref 0.0–0.7)
Eosinophils Relative: 1.5 % (ref 0.0–5.0)
HCT: 33.1 % — ABNORMAL LOW (ref 36.0–46.0)
Hemoglobin: 10.7 g/dL — ABNORMAL LOW (ref 12.0–15.0)
Lymphocytes Relative: 52 % — ABNORMAL HIGH (ref 12.0–46.0)
Lymphs Abs: 2.7 K/uL (ref 0.7–4.0)
MCHC: 32.4 g/dL (ref 30.0–36.0)
MCV: 81 fl (ref 78.0–100.0)
Monocytes Absolute: 0.4 K/uL (ref 0.1–1.0)
Monocytes Relative: 7.8 % (ref 3.0–12.0)
Neutro Abs: 2 K/uL (ref 1.4–7.7)
Neutrophils Relative %: 38.4 % — ABNORMAL LOW (ref 43.0–77.0)
Platelets: 291 K/uL (ref 150.0–400.0)
RBC: 4.08 Mil/uL (ref 3.87–5.11)
RDW: 15.6 % — ABNORMAL HIGH (ref 11.5–15.5)
WBC: 5.2 K/uL (ref 4.0–10.5)

## 2020-11-19 MED ORDER — SODIUM CHLORIDE 0.9 % IV SOLN
300.0000 mg | Freq: Once | INTRAVENOUS | Status: AC
  Administered 2020-11-19: 300 mg via INTRAVENOUS
  Filled 2020-11-19: qty 15

## 2020-11-19 MED ORDER — ANTICOAGULANT SODIUM CITRATE 4% (200MG/5ML) IV SOLN
5.0000 mL | Freq: Once | Status: DC | PRN
  Filled 2020-11-19: qty 5

## 2020-11-19 MED ORDER — SODIUM CHLORIDE 0.9 % IV SOLN: INTRAVENOUS | Status: DC

## 2020-11-19 MED ORDER — ACETAMINOPHEN 325 MG PO TABS: 650.0000 mg | ORAL_TABLET | Freq: Once | ORAL | Status: DC

## 2020-11-19 MED ORDER — ALTEPLASE 2 MG IJ SOLR: 2.0000 mg | Freq: Once | INTRAMUSCULAR | Status: DC | PRN

## 2020-11-19 MED ORDER — SODIUM CHLORIDE 0.9% FLUSH: 10.0000 mL | Freq: Once | INTRAVENOUS | Status: DC | PRN

## 2020-11-19 MED ORDER — LORATADINE 10 MG PO TABS: 10.0000 mg | ORAL_TABLET | Freq: Once | ORAL | Status: DC

## 2020-11-19 MED ORDER — SODIUM CHLORIDE 0.9% FLUSH: 3.0000 mL | Freq: Once | INTRAVENOUS | Status: DC | PRN

## 2020-11-19 MED ORDER — HEPARIN SOD (PORK) LOCK FLUSH 100 UNIT/ML IV SOLN: 500.0000 [IU] | Freq: Once | INTRAVENOUS | Status: DC | PRN

## 2020-11-19 MED ORDER — HEPARIN SOD (PORK) LOCK FLUSH 100 UNIT/ML IV SOLN: 250.0000 [IU] | Freq: Once | INTRAVENOUS | Status: DC | PRN

## 2020-11-19 NOTE — Progress Notes (Signed)
Diagnosis: Multiple Sclerosis  Provider:  Marshell Garfinkel, MD  Procedure: Infusion  IV Type: Peripheral, IV Location: L Antecubital  Tysabri (Natalizumab), Dose: 300 mg  Infusion Start Time: 0922  Infusion Stop Time: 9201  Post Infusion IV Care: Peripheral IV Discontinued Pt declined waiting period.  Discharge: Condition: Good, Destination: Home . AVS provided to patient.   Performed by:  Koren Shiver, RN

## 2020-11-25 NOTE — Progress Notes (Signed)
Diagnosis: Multiple Sclerosis  Provider:  Marshell Garfinkel, MD  Procedure: Infusion  IV Type: Peripheral, IV Location: L Antecubital  Tysabri (Natalizumab), Dose: 300 mg  Infusion Start Time: 0922  Infusion Stop Time: 7225  Post Infusion IV Care: Peripheral IV Discontinued Pt declined waiting period.  Discharge: Condition: Good, Destination: Home . AVS provided to patient.   Performed by:  Koren Shiver RN

## 2020-11-29 LAB — STRATIFY JCV AB (W/ INDEX) W/ RFLX
Index Value: 0.25 — ABNORMAL HIGH
Stratify JCV (TM) Ab w/Reflex Inhibition: UNDETERMINED — AB

## 2020-11-29 LAB — RFLX STRATIFY JCV (TM) AB INHIBITION: JCV Antibody by Inhibition: NEGATIVE

## 2020-12-06 ENCOUNTER — Other Ambulatory Visit: Payer: Self-pay | Admitting: Obstetrics and Gynecology

## 2020-12-06 ENCOUNTER — Other Ambulatory Visit: Payer: Self-pay

## 2020-12-06 ENCOUNTER — Ambulatory Visit
Admission: RE | Admit: 2020-12-06 | Discharge: 2020-12-06 | Disposition: A | Discharge status: Home / Self Care | Payer: Managed Care, Other (non HMO) | Source: Ambulatory Visit | Attending: Internal Medicine | Admitting: Internal Medicine

## 2020-12-06 ENCOUNTER — Encounter (HOSPITAL_COMMUNITY)
Admission: RE | Admit: 2020-12-06 | Discharge: 2020-12-06 | Disposition: A | Discharge status: Home / Self Care | Payer: Managed Care, Other (non HMO) | Source: Ambulatory Visit | Attending: Obstetrics and Gynecology | Admitting: Obstetrics and Gynecology

## 2020-12-06 DIAGNOSIS — Z01810 Encounter for preprocedural cardiovascular examination: Secondary | ICD-10-CM | POA: Diagnosis present

## 2020-12-06 DIAGNOSIS — R928 Other abnormal and inconclusive findings on diagnostic imaging of breast: Secondary | ICD-10-CM

## 2020-12-06 LAB — CBC
HCT: 35.2 % — ABNORMAL LOW (ref 36.0–46.0)
Hemoglobin: 11 g/dL — ABNORMAL LOW (ref 12.0–15.0)
MCH: 26.3 pg (ref 26.0–34.0)
MCHC: 31.3 g/dL (ref 30.0–36.0)
MCV: 84.2 fL (ref 80.0–100.0)
Platelets: 356 10*3/uL (ref 150–400)
RBC: 4.18 MIL/uL (ref 3.87–5.11)
RDW: 14.3 % (ref 11.5–15.5)
WBC: 7.3 10*3/uL (ref 4.0–10.5)
nRBC: 1.2 % — ABNORMAL HIGH (ref 0.0–0.2)

## 2020-12-06 LAB — COMPREHENSIVE METABOLIC PANEL
ALT: 14 U/L (ref 0–44)
AST: 16 U/L (ref 15–41)
Albumin: 4.5 g/dL (ref 3.5–5.0)
Alkaline Phosphatase: 43 U/L (ref 38–126)
Anion gap: 11 (ref 5–15)
BUN: 10 mg/dL (ref 6–20)
CO2: 25 mmol/L (ref 22–32)
Calcium: 9 mg/dL (ref 8.9–10.3)
Chloride: 100 mmol/L (ref 98–111)
Creatinine, Ser: 0.61 mg/dL (ref 0.44–1.00)
GFR, Estimated: >60 mL/min (ref >60)
Glucose, Bld: 86 mg/dL (ref 70–99)
Potassium: 3.7 mmol/L (ref 3.5–5.1)
Sodium: 136 mmol/L (ref 135–145)
Total Bilirubin: 0.6 mg/dL (ref 0.3–1.2)
Total Protein: 8.1 g/dL (ref 6.5–8.1)

## 2020-12-10 ENCOUNTER — Telehealth: Payer: Self-pay | Admitting: Neurology

## 2020-12-10 ENCOUNTER — Ambulatory Visit: Payer: Managed Care, Other (non HMO)

## 2020-12-10 NOTE — Telephone Encounter (Signed)
Okay to continue with infusion. If she wants to delay until next week, thanks okay, too.

## 2020-12-10 NOTE — Telephone Encounter (Signed)
Called patient and informed her that per Dr. Posey Pronto is Okay for her to continue with her infusion. If she wants to delay until next week, thanks okay, too. Patient verbalized understanding and had no further questions or concerns.

## 2020-12-10 NOTE — Telephone Encounter (Signed)
Procedure on Friday-is she able to continue her infusion. Should she wait after the procedure? She is having a Hysteroscopy.

## 2020-12-11 ENCOUNTER — Other Ambulatory Visit: Payer: Self-pay

## 2020-12-11 ENCOUNTER — Encounter (HOSPITAL_BASED_OUTPATIENT_CLINIC_OR_DEPARTMENT_OTHER): Payer: Self-pay | Admitting: Obstetrics and Gynecology

## 2020-12-11 DIAGNOSIS — N390 Urinary tract infection, site not specified: Secondary | ICD-10-CM

## 2020-12-11 HISTORY — DX: Urinary tract infection, site not specified: N39.0

## 2020-12-11 NOTE — Progress Notes (Addendum)
Spoke w/ via phone for pre-op interview---pt Lab needs dos----  urine preg             Lab results------cbc cmp 12-06-2020 epic, ekg 12-06-2020 chart/epic COVID test -----patient states asymptomatic no test needed Arrive at -------830 am 12-13-2020 NPO after MN NO Solid Food.  Clear liquids from MN until---730 am Med rec completed Medications to take morning of surgery ----- baclofen, claritin,  Diabetic medication -----none day of surgery Patient instructed no nail polish to be worn day of surgery Patient instructed to bring photo id and insurance card day of surgery Patient aware to have Driver (ride ) / caregiver    for 24 hours after surgery  mother tanya kuechle will stay Patient Special Instructions -----none Pre-Op special Istructions -----none Patient verbalized understanding of instructions that were given at this phone interview. Patient denies shortness of breath, chest pain, fever, cough at this phone interview.   [facility] neurology dr patel 03-08-2020 epic (ms)

## 2020-12-11 NOTE — H&P (Signed)
Peggy Banks is an 40 y.o. female G1P1 with AUB - for hysteroscopy, D&C, myosure for submucosal fibroid removal.  Has 2x2 cm submucosal fibroid and multiple large intramural fibroids.  .  D/W pt r/b/a, process and expectations of surgery.  Pt's medical history complicated by Multiple Sclerosis.   Pertinent Gynecological History: No Abn Pap H/o trich G1P1 LTCS - arrest of dilation  Menstrual History:  No LMP recorded.    Past Medical History:  Diagnosis Date   Hypertension    MS (multiple sclerosis) ([place])     Past Surgical History:  Procedure Laterality Date   CESAREAN SECTION     EYE SURGERY     lasik bilat    Family History  Problem Relation Age of Onset   Breast cancer Mother    Hypothyroidism Mother    Hypertension Mother    Hypertension Father    Healthy Sister    Cancer Maternal Grandfather    Stroke Paternal Grandmother    Other Paternal Grandfather        MVA    Social History:  reports that she has never smoked. She has never used smokeless tobacco. She reports current alcohol use. She reports that she does not use drugs.  Allergies:  Allergies  Allergen Reactions   Cefdinir Swelling    Meds: baclofen, claritin, fish oil, lisinopril/HCTZ; metformin, MVI, rosuvastation, Tysabri, Vit D  Review of Systems  Constitutional: Negative.   Respiratory: Negative.    Cardiovascular: Negative.   Gastrointestinal: Negative.   Genitourinary:  Positive for menstrual problem.  Musculoskeletal: Negative.   Skin: Negative.   Neurological: Negative.   Psychiatric/Behavioral: Negative.     There were no vitals taken for this visit. Physical Exam Constitutional:      Appearance: Normal appearance.  HENT:     Head: Normocephalic and atraumatic.  Cardiovascular:     Rate and Rhythm: Normal rate and regular rhythm.  Pulmonary:     Effort: Pulmonary effort is normal.     Breath sounds: Normal breath sounds.  Abdominal:     General: Bowel sounds are normal.      Palpations: Abdomen is soft.  Musculoskeletal:        General: Normal range of motion.     Cervical back: Normal range of motion and neck supple.  Skin:    General: Skin is warm and dry.  Neurological:     General: No focal deficit present.     Mental Status: She is alert and oriented to person, place, and time.  Psychiatric:        Mood and Affect: Mood normal.        Behavior: Behavior normal.   EMB benign Nl ovaries, L adnexal simple cyst Multiple fibroids (5.5 x 4.1, 6.5 x 4.7, 3.5 x 3.1, 2.4 x 2.2cm)  Assessment/Plan: 40yo with AUB and submucosal fibroid D/w pt r/b/a of surgery, also process and expectations Possible need for additional surgery as reasonably sized fibroid Will proceed  Aureliano Oshields Bovard-Stuckert 12/11/2020, 11:54 AM

## 2020-12-13 ENCOUNTER — Other Ambulatory Visit: Payer: Self-pay

## 2020-12-13 ENCOUNTER — Ambulatory Visit (HOSPITAL_BASED_OUTPATIENT_CLINIC_OR_DEPARTMENT_OTHER)
Admission: RE | Admit: 2020-12-13 | Discharge: 2020-12-13 | Disposition: A | Discharge status: Home / Self Care | Payer: Managed Care, Other (non HMO) | Source: Ambulatory Visit | Attending: Obstetrics and Gynecology | Admitting: Obstetrics and Gynecology

## 2020-12-13 ENCOUNTER — Encounter (HOSPITAL_BASED_OUTPATIENT_CLINIC_OR_DEPARTMENT_OTHER)
Admission: RE | Disposition: A | Discharge status: Home / Self Care | Payer: Self-pay | Source: Ambulatory Visit | Attending: Obstetrics and Gynecology

## 2020-12-13 ENCOUNTER — Ambulatory Visit (HOSPITAL_BASED_OUTPATIENT_CLINIC_OR_DEPARTMENT_OTHER): Payer: Managed Care, Other (non HMO) | Admitting: Anesthesiology

## 2020-12-13 ENCOUNTER — Encounter (HOSPITAL_BASED_OUTPATIENT_CLINIC_OR_DEPARTMENT_OTHER): Payer: Self-pay | Admitting: Obstetrics and Gynecology

## 2020-12-13 DIAGNOSIS — I10 Essential (primary) hypertension: Secondary | ICD-10-CM | POA: Diagnosis not present

## 2020-12-13 DIAGNOSIS — D251 Intramural leiomyoma of uterus: Secondary | ICD-10-CM | POA: Insufficient documentation

## 2020-12-13 DIAGNOSIS — E119 Type 2 diabetes mellitus without complications: Secondary | ICD-10-CM | POA: Insufficient documentation

## 2020-12-13 DIAGNOSIS — D252 Subserosal leiomyoma of uterus: Secondary | ICD-10-CM | POA: Insufficient documentation

## 2020-12-13 DIAGNOSIS — G35 Multiple sclerosis: Secondary | ICD-10-CM | POA: Diagnosis not present

## 2020-12-13 DIAGNOSIS — D250 Submucous leiomyoma of uterus: Secondary | ICD-10-CM | POA: Diagnosis not present

## 2020-12-13 DIAGNOSIS — N939 Abnormal uterine and vaginal bleeding, unspecified: Secondary | ICD-10-CM

## 2020-12-13 DIAGNOSIS — Z7984 Long term (current) use of oral hypoglycemic drugs: Secondary | ICD-10-CM | POA: Insufficient documentation

## 2020-12-13 HISTORY — PX: DILATATION & CURETTAGE/HYSTEROSCOPY WITH MYOSURE: SHX6511

## 2020-12-13 HISTORY — DX: Presence of spectacles and contact lenses: Z97.3

## 2020-12-13 HISTORY — DX: Abnormal uterine and vaginal bleeding, unspecified: N93.9

## 2020-12-13 HISTORY — DX: Hyperlipidemia, unspecified: E78.5

## 2020-12-13 HISTORY — DX: Type 2 diabetes mellitus without complications: E11.9

## 2020-12-13 LAB — CBC
HCT: 32 % — ABNORMAL LOW (ref 36.0–46.0)
Hemoglobin: 10 g/dL — ABNORMAL LOW (ref 12.0–15.0)
MCH: 26.5 pg (ref 26.0–34.0)
MCHC: 31.3 g/dL (ref 30.0–36.0)
MCV: 84.7 fL (ref 80.0–100.0)
Platelets: 237 10*3/uL (ref 150–400)
RBC: 3.78 MIL/uL — ABNORMAL LOW (ref 3.87–5.11)
RDW: 14.4 % (ref 11.5–15.5)
WBC: 4.9 10*3/uL (ref 4.0–10.5)
nRBC: 1.2 % — ABNORMAL HIGH (ref 0.0–0.2)

## 2020-12-13 LAB — BASIC METABOLIC PANEL
Anion gap: 7 (ref 5–15)
BUN: 10 mg/dL (ref 6–20)
CO2: 23 mmol/L (ref 22–32)
Calcium: 8.3 mg/dL — ABNORMAL LOW (ref 8.9–10.3)
Chloride: 106 mmol/L (ref 98–111)
Creatinine, Ser: 0.6 mg/dL (ref 0.44–1.00)
GFR, Estimated: >60 mL/min (ref >60)
Glucose, Bld: 103 mg/dL — ABNORMAL HIGH (ref 70–99)
Potassium: 3.7 mmol/L (ref 3.5–5.1)
Sodium: 136 mmol/L (ref 135–145)

## 2020-12-13 LAB — POCT PREGNANCY, URINE: Preg Test, Ur: NEGATIVE

## 2020-12-13 LAB — GLUCOSE, CAPILLARY: Glucose-Capillary: 114 mg/dL — ABNORMAL HIGH (ref 70–99)

## 2020-12-13 SURGERY — DILATATION & CURETTAGE/HYSTEROSCOPY WITH MYOSURE
Anesthesia: General | Site: Vagina

## 2020-12-13 MED ORDER — SCOPOLAMINE 1 MG/3DAYS TD PT72
1.0000 | MEDICATED_PATCH | TRANSDERMAL | Status: DC
  Administered 2020-12-13: 1.5 mg via TRANSDERMAL

## 2020-12-13 MED ORDER — MIDAZOLAM HCL 2 MG/2ML IJ SOLN
INTRAMUSCULAR | Status: AC
  Filled 2020-12-13: qty 2

## 2020-12-13 MED ORDER — FENTANYL CITRATE (PF) 100 MCG/2ML IJ SOLN: 25.0000 ug | INTRAMUSCULAR | Status: DC | PRN

## 2020-12-13 MED ORDER — LIDOCAINE 2% (20 MG/ML) 5 ML SYRINGE
INTRAMUSCULAR | Status: DC | PRN
  Administered 2020-12-13: 40 mg via INTRAVENOUS

## 2020-12-13 MED ORDER — DEXAMETHASONE SODIUM PHOSPHATE 10 MG/ML IJ SOLN
INTRAMUSCULAR | Status: DC | PRN
  Administered 2020-12-13: 5 mg via INTRAVENOUS

## 2020-12-13 MED ORDER — PHENYLEPHRINE 40 MCG/ML (10ML) SYRINGE FOR IV PUSH (FOR BLOOD PRESSURE SUPPORT)
PREFILLED_SYRINGE | INTRAVENOUS | Status: AC
  Filled 2020-12-13: qty 10

## 2020-12-13 MED ORDER — ACETAMINOPHEN 10 MG/ML IV SOLN: 1000.0000 mg | Freq: Once | INTRAVENOUS | Status: DC | PRN

## 2020-12-13 MED ORDER — LIDOCAINE HCL 1 % IJ SOLN
INTRAMUSCULAR | Status: DC | PRN
  Administered 2020-12-13: 10 mL

## 2020-12-13 MED ORDER — PHENYLEPHRINE 40 MCG/ML (10ML) SYRINGE FOR IV PUSH (FOR BLOOD PRESSURE SUPPORT)
PREFILLED_SYRINGE | INTRAVENOUS | Status: DC | PRN
  Administered 2020-12-13: 80 ug via INTRAVENOUS

## 2020-12-13 MED ORDER — KETOROLAC TROMETHAMINE 30 MG/ML IJ SOLN
INTRAMUSCULAR | Status: DC | PRN
  Administered 2020-12-13: 30 mg via INTRAVENOUS

## 2020-12-13 MED ORDER — IBUPROFEN 800 MG PO TABS: 800.0000 mg | ORAL_TABLET | Freq: Three times a day (TID) | ORAL | 1 refills | Status: AC | PRN

## 2020-12-13 MED ORDER — OXYCODONE HCL 5 MG PO TABS: 5.0000 mg | ORAL_TABLET | Freq: Four times a day (QID) | ORAL | 0 refills | Status: DC | PRN

## 2020-12-13 MED ORDER — SOD CITRATE-CITRIC ACID 500-334 MG/5ML PO SOLN: 30.0000 mL | ORAL | Status: DC

## 2020-12-13 MED ORDER — FENTANYL CITRATE (PF) 100 MCG/2ML IJ SOLN
INTRAMUSCULAR | Status: AC
  Filled 2020-12-13: qty 2

## 2020-12-13 MED ORDER — OXYCODONE HCL 5 MG/5ML PO SOLN: 5.0000 mg | Freq: Once | ORAL | Status: DC | PRN

## 2020-12-13 MED ORDER — PROPOFOL 10 MG/ML IV BOLUS
INTRAVENOUS | Status: AC
  Filled 2020-12-13: qty 20

## 2020-12-13 MED ORDER — SCOPOLAMINE 1 MG/3DAYS TD PT72
MEDICATED_PATCH | TRANSDERMAL | Status: AC
  Filled 2020-12-13: qty 1

## 2020-12-13 MED ORDER — DEXAMETHASONE SODIUM PHOSPHATE 10 MG/ML IJ SOLN
INTRAMUSCULAR | Status: AC
  Filled 2020-12-13: qty 1

## 2020-12-13 MED ORDER — ACETAMINOPHEN 160 MG/5ML PO SOLN: 325.0000 mg | ORAL | Status: DC | PRN

## 2020-12-13 MED ORDER — LIDOCAINE 2% (20 MG/ML) 5 ML SYRINGE
INTRAMUSCULAR | Status: AC
  Filled 2020-12-13: qty 5

## 2020-12-13 MED ORDER — FENTANYL CITRATE (PF) 100 MCG/2ML IJ SOLN
INTRAMUSCULAR | Status: DC | PRN
  Administered 2020-12-13 (×2): 25 ug via INTRAVENOUS
  Administered 2020-12-13 (×3): 50 ug via INTRAVENOUS

## 2020-12-13 MED ORDER — KETOROLAC TROMETHAMINE 30 MG/ML IJ SOLN
INTRAMUSCULAR | Status: AC
  Filled 2020-12-13: qty 1

## 2020-12-13 MED ORDER — GABAPENTIN 300 MG PO CAPS
300.0000 mg | ORAL_CAPSULE | ORAL | Status: AC
  Administered 2020-12-13: 300 mg via ORAL

## 2020-12-13 MED ORDER — AMISULPRIDE (ANTIEMETIC) 5 MG/2ML IV SOLN: 10.0000 mg | Freq: Once | INTRAVENOUS | Status: DC | PRN

## 2020-12-13 MED ORDER — OXYCODONE HCL 5 MG PO TABS: 5.0000 mg | ORAL_TABLET | Freq: Once | ORAL | Status: DC | PRN

## 2020-12-13 MED ORDER — ACETAMINOPHEN 325 MG PO TABS: 325.0000 mg | ORAL_TABLET | ORAL | Status: DC | PRN

## 2020-12-13 MED ORDER — ONDANSETRON HCL 4 MG/2ML IJ SOLN
INTRAMUSCULAR | Status: DC | PRN
  Administered 2020-12-13: 4 mg via INTRAVENOUS

## 2020-12-13 MED ORDER — PROPOFOL 10 MG/ML IV BOLUS
INTRAVENOUS | Status: DC | PRN
  Administered 2020-12-13: 50 mg via INTRAVENOUS
  Administered 2020-12-13: 200 mg via INTRAVENOUS

## 2020-12-13 MED ORDER — MIDAZOLAM HCL 5 MG/5ML IJ SOLN
INTRAMUSCULAR | Status: DC | PRN
  Administered 2020-12-13: 2 mg via INTRAVENOUS

## 2020-12-13 MED ORDER — ONDANSETRON HCL 4 MG/2ML IJ SOLN
INTRAMUSCULAR | Status: AC
  Filled 2020-12-13: qty 2

## 2020-12-13 MED ORDER — LACTATED RINGERS IV SOLN: INTRAVENOUS | Status: DC

## 2020-12-13 MED ORDER — GABAPENTIN 300 MG PO CAPS
ORAL_CAPSULE | ORAL | Status: AC
  Filled 2020-12-13: qty 1

## 2020-12-13 MED ORDER — POVIDONE-IODINE 10 % EX SWAB: 2.0000 "application " | Freq: Once | CUTANEOUS | Status: DC

## 2020-12-13 MED ORDER — SODIUM CHLORIDE 0.9 % IR SOLN
Status: DC | PRN
  Administered 2020-12-13: 3000 mL

## 2020-12-13 MED ORDER — PROMETHAZINE HCL 25 MG/ML IJ SOLN: 6.2500 mg | INTRAMUSCULAR | Status: DC | PRN

## 2020-12-13 MED ORDER — ACETAMINOPHEN 500 MG PO TABS
1000.0000 mg | ORAL_TABLET | ORAL | Status: AC
  Administered 2020-12-13: 1000 mg via ORAL

## 2020-12-13 SURGICAL SUPPLY — 16 items
CATH ROBINSON RED A/P 16FR (CATHETERS) IMPLANT
DEVICE MYOSURE LITE (MISCELLANEOUS) IMPLANT
DEVICE MYOSURE REACH (MISCELLANEOUS) ×2 IMPLANT
DILATOR CANAL MILEX (MISCELLANEOUS) IMPLANT
DRSG TELFA 3X8 NADH (GAUZE/BANDAGES/DRESSINGS) ×2 IMPLANT
GAUZE 4X4 16PLY ~~LOC~~+RFID DBL (SPONGE) ×4 IMPLANT
GLOVE SURG ENC MOIS LTX SZ6.5 (GLOVE) ×2 IMPLANT
GLOVE SURG UNDER POLY LF SZ7 (GLOVE) ×4 IMPLANT
GOWN STRL REUS W/TWL LRG LVL3 (GOWN DISPOSABLE) ×2 IMPLANT
IV NS IRRIG 3000ML ARTHROMATIC (IV SOLUTION) ×4 IMPLANT
KIT PROCEDURE FLUENT (KITS) ×2 IMPLANT
KIT TURNOVER CYSTO (KITS) ×2 IMPLANT
PACK VAGINAL MINOR WOMEN LF (CUSTOM PROCEDURE TRAY) ×2 IMPLANT
PAD OB MATERNITY 4.3X12.25 (PERSONAL CARE ITEMS) ×2 IMPLANT
SEAL CERVICAL OMNI LOK (ABLATOR) IMPLANT
SEAL ROD LENS SCOPE MYOSURE (ABLATOR) ×2 IMPLANT

## 2020-12-13 NOTE — Anesthesia Postprocedure Evaluation (Signed)
Anesthesia Post Note  Patient: Peggy Banks  Procedure(s) Performed: DILATATION & CURETTAGE/HYSTEROSCOPY WITH MYOSURE (Vagina )     Patient location during evaluation: PACU Anesthesia Type: General Level of consciousness: awake and alert Pain management: pain level controlled Vital Signs Assessment: post-procedure vital signs reviewed and stable Respiratory status: spontaneous breathing, nonlabored ventilation, respiratory function stable and patient connected to nasal cannula oxygen Cardiovascular status: blood pressure returned to baseline and stable Postop Assessment: no apparent nausea or vomiting Anesthetic complications: no   No notable events documented.  Last Vitals:  Vitals:   12/13/20 1200 12/13/20 1230  BP: 133/80 130/80  Pulse: 87 86  Resp: 14 16  Temp: 36.5 C (!) 36.4 C  SpO2: 95% 96%    Last Pain:  Vitals:   12/13/20 1230  TempSrc:   PainSc: 2                  Effie Berkshire

## 2020-12-13 NOTE — Anesthesia Procedure Notes (Signed)
Procedure Name: LMA Insertion Date/Time: 12/13/2020 10:17 AM Performed by: Rogers Blocker, CRNA Pre-anesthesia Checklist: Patient identified, Emergency Drugs available, Suction available and Patient being monitored Patient Re-evaluated:Patient Re-evaluated prior to induction Oxygen Delivery Method: Circle System Utilized Preoxygenation: Pre-oxygenation with 100% oxygen Induction Type: IV induction Ventilation: Mask ventilation without difficulty LMA: LMA inserted LMA Size: 4.0 Number of attempts: 1 Airway Equipment and Method: Bite block Placement Confirmation: positive ETCO2 Tube secured with: Tape Dental Injury: Teeth and Oropharynx as per pre-operative assessment

## 2020-12-13 NOTE — Discharge Instructions (Addendum)
DISCHARGE INSTRUCTIONS: D&C The following instructions have been prepared to help you care for yourself upon your return home.   Personal hygiene:  Use sanitary pads for vaginal drainage, not tampons.  Shower the day after your procedure.  NO tub baths, pools or Jacuzzis for 2-3 weeks.  Wipe front to back after using the bathroom.  Activity and limitations:  Do NOT drive or operate any equipment for 24 hours. The effects of anesthesia are still present and drowsiness may result.  Do NOT rest in bed all day.  Walking is encouraged.  Walk up and down stairs slowly.  You may resume your normal activity in one to two days or as indicated by your physician.  Sexual activity: NO intercourse for at least 2 weeks after the procedure, or as indicated by your physician.  Diet: Eat a light meal as desired this evening. You may resume your usual diet tomorrow.  Return to work: You may resume your work activities in one to two days or as indicated by your doctor.  What to expect after your surgery: Expect to have vaginal bleeding/discharge for 2-3 days and spotting for up to 10 days. It is not unusual to have soreness for up to 1-2 weeks. You may have a slight burning sensation when you urinate for the first day. Mild cramps may continue for a couple of days. You may have a regular period in 2-6 weeks.  Call your doctor for any of the following:  Excessive vaginal bleeding, saturating and changing one pad every hour.  Inability to urinate 6 hours after discharge from hospital.  Pain not relieved by pain medication.  Fever of 100.4 F or greater.  Unusual vaginal discharge or odor.  No Tylenol or acetaminophen-containing products until 4pm today if needed  No ibuprofen, Advil, Aleve, Motrin, ketorolac, meloxicam, naproxen, or other NSAIDS until after 5pm today if needed.   Post Anesthesia Home Care Instructions  Activity: Get plenty of rest for the remainder of the day. A responsible  individual must stay with you for 24 hours following the procedure.  For the next 24 hours, DO NOT: -Drive a car -Paediatric nurse -Drink alcoholic beverages -Take any medication unless instructed by your physician -Make any legal decisions or sign important papers.  Meals: Start with liquid foods such as gelatin or soup. Progress to regular foods as tolerated. Avoid greasy, spicy, heavy foods. If nausea and/or vomiting occur, drink only clear liquids until the nausea and/or vomiting subsides. Call your physician if vomiting continues.  Special Instructions/Symptoms: Your throat may feel dry or sore from the anesthesia or the breathing tube placed in your throat during surgery. If this causes discomfort, gargle with warm salt water. The discomfort should disappear within 24 hours.  If you had a scopolamine patch placed behind your ear for the management of post- operative nausea and/or vomiting:  1. The medication in the patch is effective for 72 hours, after which it should be removed.  Wrap patch in a tissue and discard in the trash. Wash hands thoroughly with soap and water. 2. You may remove the patch earlier than 72 hours if you experience unpleasant side effects which may include dry mouth, dizziness or visual disturbances. 3. Avoid touching the patch. Wash your hands with soap and water after contact with the patch.

## 2020-12-13 NOTE — Brief Op Note (Signed)
12/13/2020  11:16 AM  PATIENT:  Peggy Banks  40 y.o. female  PRE-OPERATIVE DIAGNOSIS:  submucous leiomyoma of uterus  POST-OPERATIVE DIAGNOSIS:  submucous leiomyoma of uterus  PROCEDURE:  Procedure(s): DILATATION & CURETTAGE/HYSTEROSCOPY WITH MYOSURE (N/A)  SURGEON:  Surgeon(s) and Role:    * Bovard-Stuckert, Tahmid Stonehocker, MD - Primary  ANESTHESIA:   local and general by LMA  EBL:  50 mL IVF per anesthesia, voided directly before procedure.  Deficit 1750cc  BLOOD ADMINISTERED:none  DRAINS: none   LOCAL MEDICATIONS USED:  LIDOCAINE   SPECIMEN:  Source of Specimen:  endometrial currettings, polyp vs fibroid  DISPOSITION OF SPECIMEN:  PATHOLOGY  COUNTS:  YES  TOURNIQUET:  * No tourniquets in log *  DICTATION: .Other Dictation: Dictation Number [id number]  PLAN OF CARE: Discharge to home after PACU  PATIENT DISPOSITION:  PACU - hemodynamically stable.   Delay start of Pharmacological VTE agent (>24hrs) due to surgical blood loss or risk of bleeding: not applicable

## 2020-12-13 NOTE — Transfer of Care (Addendum)
  Immediate Anesthesia Transfer of Care Note  Patient: Peggy Banks  Procedure(s) Performed: Procedure(s) (LRB): DILATATION & CURETTAGE/HYSTEROSCOPY WITH MYOSURE (N/A)  Patient Location: PACU  Anesthesia Type: General  Level of Consciousness: awake, oriented, sedated and patient cooperative  Airway & Oxygen Therapy: Patient Spontanous Breathing and Patient connected to face mask oxygen  Post-op Assessment: Report given to PACU RN and Post -op Vital signs reviewed and stable  Post vital signs: Reviewed and stable  Complications: No apparent anesthesia complications Last Vitals:  Vitals Value Taken Time  BP 130/84 12/13/20 1106  Temp 36.3 C 12/13/20 1106  Pulse 96 12/13/20 1111  Resp 12 12/13/20 1111  SpO2 100 % 12/13/20 1111  Vitals shown include unvalidated device data.  Last Pain:  Vitals:   12/13/20 1106  TempSrc:   PainSc: 0-No pain      Patients Stated Pain Goal: 5 (11/06/09 1735)  Complications: No notable events documented.

## 2020-12-13 NOTE — Anesthesia Preprocedure Evaluation (Addendum)
Anesthesia Evaluation  Patient identified by MRN, date of birth, ID band Patient awake    Reviewed: Allergy & Precautions, NPO status , Patient's Chart, lab work & pertinent test results  Airway Mallampati: II  TM Distance: >3 FB Neck ROM: Full    Dental  (+) Teeth Intact, Dental Advisory Given   Pulmonary former smoker,    breath sounds clear to auscultation       Cardiovascular hypertension, Pt. on medications  Rhythm:Regular Rate:Normal     Neuro/Psych negative neurological ROS  negative psych ROS   GI/Hepatic negative GI ROS, Neg liver ROS,   Endo/Other  diabetes, Type 2, Oral Hypoglycemic Agents  Renal/GU negative Renal ROS     Musculoskeletal negative musculoskeletal ROS (+)   Abdominal Normal abdominal exam  (+)   Peds  Hematology negative hematology ROS (+)   Anesthesia Other Findings   Reproductive/Obstetrics                            Anesthesia Physical Anesthesia Plan  ASA: 3  Anesthesia Plan: General   Post-op Pain Management:    Induction: Intravenous  PONV Risk Score and Plan: 4 or greater and Ondansetron, Dexamethasone, Midazolam and Scopolamine patch - Pre-op  Airway Management Planned: LMA  Additional Equipment: None  Intra-op Plan:   Post-operative Plan: Extubation in OR  Informed Consent: I have reviewed the patients History and Physical, chart, labs and discussed the procedure including the risks, benefits and alternatives for the proposed anesthesia with the patient or authorized representative who has indicated his/her understanding and acceptance.     Dental advisory given  Plan Discussed with: CRNA  Anesthesia Plan Comments:        Anesthesia Quick Evaluation

## 2020-12-13 NOTE — Interval H&P Note (Signed)
History and Physical Interval Note:  12/13/2020 9:08 AM  Peggy Banks  has presented today for surgery, with the diagnosis of submucous leiomyoma of uterus.  The various methods of treatment have been discussed with the patient and family. After consideration of risks, benefits and other options for treatment, the patient has consented to  Procedure(s): Latty (N/A) as a surgical intervention.  The patient's history has been reviewed, patient examined, no change in status, stable for surgery.  I have reviewed the patient's chart and labs.  Questions were answered to the patient's satisfaction.     Tressa Maldonado Bovard-Stuckert

## 2020-12-14 NOTE — Op Note (Signed)
Peggy Banks, STEELMAN MEDICAL RECORD NO: [id number] ACCOUNT NO: [id number] DATE OF BIRTH: September 18, 1980 FACILITY: [facility] LOCATION: [facility] PHYSICIAN: Janyth Contes, MD  Operative Report   DATE OF PROCEDURE: 12/13/2020  PREOPERATIVE DIAGNOSIS:  Multiple large intramural, subserosal fibroids and a submucosal fibroid of the uterus.  PROCEDURE:  Hysteroscopy, D and C with MyoSure.  SURGEON:  Janyth Contes, MD  ANESTHESIA:  Lidocaine for paracervical block and general anesthesia by LMA.  ESTIMATED BLOOD LOSS:  Approximately 50 mL.  INTRAVENOUS FLUID: Per anesthesia, voided directly before the procedure. Deficit 1750 mL.  COMPLICATIONS:  The fluid management system seemed to be malfunctioning.  There was a point where the deficit was more than the fluid that had been yielded by the system.  It was restarted and the fluid loss was manually calculated. The deficit did not  seem to be accurate. The patient's bleeding was under control.  On reentry into her uterus, we were not able to visualize a perforation and with reentry that deficit did not have a large jump as it would be expected with the perforation.  The patient had  labs checked in the PACU following the procedure.  She was hemodynamically stable throughout.  PATHOLOGY:  Endometrial curettings with polyp versus fibroid.  DESCRIPTION OF PROCEDURE:  After informed consent was reviewed with the patient including risks, benefits and alternatives of surgical procedure, she was transported to the operating room and placed on the table in supine position.  General anesthesia  was induced and found to be adequate.  After LMA was established, she was placed in the [place] stirrups, prepped and draped in the normal sterile fashion.  An appropriate time-out was performed, and using an open-sided speculum, her cervix was  visualized.  A paracervical block was placed and the anterior lip of the cervix was grasped with a single tooth  tenaculum.  The cervix was dilated to accommodate a hysteroscope.  At the time of sounding, her uterus was noted to sound to 14 cm, which was  consistent with an ultrasound in the office.  The hysteroscope was introduced into her cavity.  A large cavity was noted, but both ostia were visualized.  The MyoSure device was used to remove a somewhat pulpy tissue on the posterior wall of the uterus  as well as some polyps and other tissue that was noted consistent with the ultrasound as well.  The above-mentioned findings were noted.  Therefore, the procedure was abandoned.  The patient was awakened in stable condition.  The instruments were removed  from her vagina.  The blood loss seemed appropriate.  Labs were checked in the PACU.  The patient tolerated the procedure well.  Sponge, lap and needle counts were correct x 2 per the operating staff.   [facility] D: 12/13/2020 11:25:36 am T: 12/14/2020 12:17:00 am  JOB: [id number]/ [id number]

## 2020-12-16 ENCOUNTER — Encounter (HOSPITAL_BASED_OUTPATIENT_CLINIC_OR_DEPARTMENT_OTHER): Payer: Self-pay | Admitting: Obstetrics and Gynecology

## 2020-12-16 LAB — SURGICAL PATHOLOGY

## 2020-12-16 NOTE — Progress Notes (Signed)
Patient states she has had problems with both eyes since surgery. The "whites" of both eyes have been extremely red. States she uses moisturizing drops on a regular basis to prevent dry eyes. Advised that this will be reported to anesthesia department for further follow up.

## 2020-12-16 NOTE — Progress Notes (Signed)
12/16/2020, 1030  Spoke with patient on routine post-op call. States she has has extremely red eyes(OU/whites), since surgery. Uses moisturizing drops routinely. Advised that Anesthesia would be informed of problem.  Gave Dr. Nyoka Cowden, anesthesiologist, information and contact sheet with MRN.

## 2020-12-17 ENCOUNTER — Ambulatory Visit (INDEPENDENT_AMBULATORY_CARE_PROVIDER_SITE_OTHER): Payer: Managed Care, Other (non HMO)

## 2020-12-17 ENCOUNTER — Other Ambulatory Visit: Payer: Self-pay

## 2020-12-17 VITALS — BP 116/78 | HR 83 | Temp 98.4°F | Resp 18 | Ht 62.0 in | Wt 217.0 lb

## 2020-12-17 DIAGNOSIS — G35 Multiple sclerosis: Secondary | ICD-10-CM | POA: Diagnosis not present

## 2020-12-17 MED ORDER — HEPARIN SOD (PORK) LOCK FLUSH 100 UNIT/ML IV SOLN: 500.0000 [IU] | Freq: Once | INTRAVENOUS | Status: DC | PRN

## 2020-12-17 MED ORDER — SODIUM CHLORIDE 0.9 % IV SOLN: Freq: Once | INTRAVENOUS | Status: DC | PRN

## 2020-12-17 MED ORDER — ALBUTEROL SULFATE HFA 108 (90 BASE) MCG/ACT IN AERS: 2.0000 | INHALATION_SPRAY | Freq: Once | RESPIRATORY_TRACT | Status: DC | PRN

## 2020-12-17 MED ORDER — SODIUM CHLORIDE 0.9 % IV SOLN
300.0000 mg | Freq: Once | INTRAVENOUS | Status: AC
  Administered 2020-12-17: 300 mg via INTRAVENOUS
  Filled 2020-12-17: qty 15

## 2020-12-17 MED ORDER — METHYLPREDNISOLONE SODIUM SUCC 125 MG IJ SOLR: 125.0000 mg | Freq: Once | INTRAMUSCULAR | Status: DC | PRN

## 2020-12-17 MED ORDER — SODIUM CHLORIDE 0.9% FLUSH: 10.0000 mL | Freq: Once | INTRAVENOUS | Status: DC | PRN

## 2020-12-17 MED ORDER — ACETAMINOPHEN 325 MG PO TABS: 650.0000 mg | ORAL_TABLET | Freq: Once | ORAL | Status: DC

## 2020-12-17 MED ORDER — SODIUM CHLORIDE 0.9% FLUSH: 3.0000 mL | Freq: Once | INTRAVENOUS | Status: DC | PRN

## 2020-12-17 MED ORDER — LORATADINE 10 MG PO TABS: 10.0000 mg | ORAL_TABLET | Freq: Once | ORAL | Status: DC

## 2020-12-17 MED ORDER — ANTICOAGULANT SODIUM CITRATE 4% (200MG/5ML) IV SOLN
5.0000 mL | Freq: Once | Status: DC | PRN
  Filled 2020-12-17: qty 5

## 2020-12-17 MED ORDER — ALTEPLASE 2 MG IJ SOLR: 2.0000 mg | Freq: Once | INTRAMUSCULAR | Status: DC | PRN

## 2020-12-17 MED ORDER — SODIUM CHLORIDE 0.9 % IV SOLN: INTRAVENOUS | Status: DC

## 2020-12-17 MED ORDER — FAMOTIDINE IN NACL 20-0.9 MG/50ML-% IV SOLN: 20.0000 mg | Freq: Once | INTRAVENOUS | Status: DC | PRN

## 2020-12-17 MED ORDER — DIPHENHYDRAMINE HCL 50 MG/ML IJ SOLN: 50.0000 mg | Freq: Once | INTRAMUSCULAR | Status: DC | PRN

## 2020-12-17 MED ORDER — EPINEPHRINE 0.3 MG/0.3ML IJ SOAJ: 0.3000 mg | Freq: Once | INTRAMUSCULAR | Status: DC | PRN

## 2020-12-17 MED ORDER — HEPARIN SOD (PORK) LOCK FLUSH 100 UNIT/ML IV SOLN: 250.0000 [IU] | Freq: Once | INTRAVENOUS | Status: DC | PRN

## 2020-12-17 NOTE — Addendum Note (Signed)
Addendum  created 12/17/20 1100 by Pervis Hocking, DO   Clinical Note Signed

## 2020-12-17 NOTE — Progress Notes (Signed)
Diagnosis: Multiple Sclerosis  Provider:  Marshell Garfinkel, MD  Procedure: Infusion  IV Type: Peripheral, IV Location: R Hand  Tysabri (Natalizumab), Dose: 300 mg  Infusion Start Time: 1991  Infusion Stop Time: 0950  Post Infusion IV Care: Patient declined observation and Peripheral IV Discontinued  Discharge: Condition: Good, Destination: Home . AVS provided to patient.   Performed by:  Koren Shiver, RN

## 2020-12-17 NOTE — Progress Notes (Signed)
Pt came in to [facility] to be evaluated for red sclerae B/L POD #4 from D+C/hysteroscopy w/ LMA. Looks to be ruptured blood vessels in B/L sclerae, vision has not been affected. Pt denies having had any nausea or coughing postoperatively so I am unsure what might have cause these blood vessels to rupture- usually caused by retching or coughing. I advised patient to see her eye doctor as soon as they can get her in.

## 2020-12-17 NOTE — Addendum Note (Signed)
Addendum  created 12/17/20 0115 by Belinda Block, MD   Intraprocedure Event edited

## 2020-12-27 ENCOUNTER — Ambulatory Visit
Admission: EM | Admit: 2020-12-27 | Discharge: 2020-12-27 | Disposition: A | Discharge status: Home / Self Care | Payer: Managed Care, Other (non HMO) | Attending: Physician Assistant | Admitting: Physician Assistant

## 2020-12-27 ENCOUNTER — Other Ambulatory Visit: Payer: Self-pay

## 2020-12-27 ENCOUNTER — Encounter: Payer: Self-pay | Admitting: Neurology

## 2020-12-27 ENCOUNTER — Encounter: Payer: Self-pay | Admitting: Emergency Medicine

## 2020-12-27 DIAGNOSIS — J069 Acute upper respiratory infection, unspecified: Secondary | ICD-10-CM

## 2020-12-27 MED ORDER — OSELTAMIVIR PHOSPHATE 75 MG PO CAPS: 75.0000 mg | ORAL_CAPSULE | Freq: Two times a day (BID) | ORAL | 0 refills | Status: DC

## 2020-12-27 NOTE — ED Provider Notes (Signed)
[facility]    CSN: [id number] Arrival date & time: 12/27/20  1737      History   Chief Complaint Chief Complaint  Patient presents with   Otalgia   Sore Throat    HPI Peggy Banks is a 40 y.o. female.   Patient here today for evaluation of left ear and left throat pain that started this morning.  She states she is also had some drainage down her throat.  She has had mild cough.  She has not had fever but has had some body aches.  The history is provided by the patient.  Otalgia Associated symptoms: congestion, cough and sore throat   Associated symptoms: no abdominal pain, no diarrhea, no fever and no vomiting   Sore Throat Pertinent negatives include no abdominal pain and no shortness of breath.   Past Medical History:  Diagnosis Date   Abnormal uterine bleeding (AUB) 12/13/2020   DM TYPE 2    History of COVID-19 12/2019   mild symptoms x 1 week all symptoms resolved   Hyperlipidemia    Hypertension    MS (multiple sclerosis) [facility])    sees dr patle neurology q year lov 03-08-2020 epic   UTI (urinary tract infection) 12/11/2020   started clindomycin 300 mg tid on 12-11-2020   Wears glasses     Patient Active Problem List   Diagnosis Date Noted   Abnormal uterine bleeding (AUB) 12/13/2020   Obesity with body mass index 30 or greater 06/07/2018   Multiple sclerosis, relapsing-remitting ([place]) 02/27/2016   Spasticity 01/31/2016   Right leg weakness 01/31/2016    Past Surgical History:  Procedure Laterality Date   CESAREAN SECTION  2010   [place] N/A 12/13/2020   Procedure: [place];  Surgeon: Janyth Contes, MD;  Location: [place];  Service: Gynecology;  Laterality: N/A;   EYE SURGERY  2011   lasik bilat    OB History   No obstetric history on file.      Home Medications    Prior to Admission medications   Medication Sig Start Date End  Date Taking? Authorizing Provider  oseltamivir (TAMIFLU) 75 MG capsule Take 1 capsule (75 mg total) by mouth every 12 (twelve) hours. 12/27/20  Yes Francene Finders, PA-C  acetaminophen (TYLENOL) 500 MG tablet Take 1,000 mg by mouth every 6 (six) hours as needed for mild pain or headache (PRN infusion). PRE MED FOR MS INFUSION    [provider]  baclofen (LIORESAL) 10 MG tablet TAKE 1 TABLET BY MOUTH 3  TIMES DAILY 10/08/20   Narda Amber K, DO  Cholecalciferol (VITAMIN D PO) Take 5,000 Units by mouth.    [provider]  clindamycin (CLEOCIN) 300 MG capsule Take 300 mg by mouth 3 (three) times daily. For uti  started 12-11-2020 prescribed by surgeon dr bovard-stuckard    [provider]  ibuprofen (ADVIL) 800 MG tablet Take 1 tablet (800 mg total) by mouth every 8 (eight) hours as needed for moderate pain. 12/13/20   Bovard-Stuckert, Jeral Fruit, MD  lisinopril-hydrochlorothiazide (PRINZIDE,ZESTORETIC) 10-12.5 MG tablet Take 1 tablet by mouth daily.    [provider]  loratadine (CLARITIN) 10 MG tablet Take 10 mg by mouth daily.    [provider]  metFORMIN (GLUCOPHAGE) 500 MG tablet Take by mouth every evening.    [provider]  Multiple Vitamin (MULTIVITAMIN) tablet Take 1 tablet by mouth daily.    [provider]  Natalizumab (TYSABRI IV) Inject into the vein every 28 (twenty-eight) days.    [provider]  Omega-3 Fatty Acids (FISH OIL) 1360 MG CAPS Take 2 capsules by mouth daily.    [provider]  oxyCODONE (ROXICODONE) 5 MG immediate release tablet Take 1 tablet (5 mg total) by mouth every 6 (six) hours as needed for severe pain. 12/13/20   Bovard-Stuckert, Jeral Fruit, MD  rosuvastatin (CRESTOR) 10 MG tablet Take 10 mg by mouth at bedtime.    [provider]    Family History Family History  Problem Relation Age of Onset   Breast cancer Mother    Hypothyroidism Mother    Hypertension Mother    Hypertension  Father    Healthy Sister    Cancer Maternal Grandfather    Stroke Paternal Grandmother    Other Paternal Grandfather        MVA    Social History Social History   Tobacco Use   Smoking status: Former    Packs/day: 0.50    Years: 12.00    Pack years: 6.00    Types: Cigarettes    Quit date: 01/05/2010    Years since quitting: 10.9   Smokeless tobacco: Never  Vaping Use   Vaping Use: Never used  Substance Use Topics   Alcohol use: Yes    Comment: Occasional   Drug use: No     Allergies   Cefdinir   Review of Systems Review of Systems  Constitutional:  Negative for chills and fever.  HENT:  Positive for congestion, ear pain and sore throat.   Eyes:  Negative for discharge and redness.  Respiratory:  Positive for cough. Negative for shortness of breath and wheezing.   Gastrointestinal:  Negative for abdominal pain, diarrhea, nausea and vomiting.  Musculoskeletal:  Positive for myalgias.    Physical Exam Triage Vital Signs ED Triage Vitals [12/27/20 1755]  Enc Vitals Group     BP 124/87     Pulse Rate 90     Resp 16     Temp 98.7 F (37.1 C)     Temp Source Oral     SpO2 98 %     Weight      Height      Head Circumference      Peak Flow      Pain Score 7     Pain Loc      Pain Edu?      Excl. in [place]?    No data found.  Updated Vital Signs BP 124/87 (BP Location: Right Arm)    Pulse 90    Temp 98.7 F (37.1 C) (Oral)    Resp 16    SpO2 98%      Physical Exam Vitals and nursing note reviewed.  Constitutional:      General: She is not in acute distress.    Appearance: Normal appearance. She is not ill-appearing.  HENT:     Head: Normocephalic and atraumatic.     Right Ear: Tympanic membrane normal.     Left Ear: Tympanic membrane normal.     Nose: Congestion present.     Mouth/Throat:     Mouth: Mucous membranes are moist.     Pharynx: No oropharyngeal exudate or posterior oropharyngeal erythema.  Eyes:     Conjunctiva/sclera: Conjunctivae  normal.  Cardiovascular:     Rate and Rhythm: Normal rate and regular rhythm.     Heart sounds: Normal heart sounds. No murmur heard. Pulmonary:  Effort: Pulmonary effort is normal. No respiratory distress.     Breath sounds: Normal breath sounds. No wheezing, rhonchi or rales.  Skin:    General: Skin is warm and dry.  Neurological:     Mental Status: She is alert.  Psychiatric:        Mood and Affect: Mood normal.        Thought Content: Thought content normal.     UC Treatments / Results  Labs (all labs ordered are listed, but only abnormal results are displayed) Labs Reviewed  COVID-19, FLU A+B NAA    EKG   Radiology No results found.  Procedures Procedures (including critical care time)  Medications Ordered in UC Medications - No data to display  Initial Impression / Assessment and Plan / UC Course  I have reviewed the triage vital signs and the nursing notes.  Pertinent labs & imaging results that were available during my care of the patient were reviewed by me and considered in my medical decision making (see chart for details).    Suspect likely viral etiology of symptoms.  Given current flu outbreak will treat with Tamiflu and will screen for COVID and flu.  Recommend symptomatic treatment otherwise.  Encouraged follow-up with any further concerns.  Final Clinical Impressions(s) / UC Diagnoses   Final diagnoses:  Acute upper respiratory infection   Discharge Instructions   None    ED Prescriptions     Medication Sig Dispense Auth. Provider   oseltamivir (TAMIFLU) 75 MG capsule Take 1 capsule (75 mg total) by mouth every 12 (twelve) hours. 10 capsule Francene Finders, PA-C      PDMP not reviewed this encounter.   Francene Finders, PA-C 12/27/20 [phone]

## 2020-12-27 NOTE — ED Triage Notes (Signed)
Left ear and left throat pain with post nasal drip starting this morning, mild cough

## 2020-12-28 LAB — COVID-19, FLU A+B NAA
Influenza A, NAA: NOT DETECTED
Influenza B, NAA: NOT DETECTED
SARS-CoV-2, NAA: NOT DETECTED

## 2021-01-07 ENCOUNTER — Ambulatory Visit: Payer: Managed Care, Other (non HMO)

## 2021-01-10 ENCOUNTER — Other Ambulatory Visit: Payer: Self-pay

## 2021-01-10 ENCOUNTER — Ambulatory Visit: Payer: Managed Care, Other (non HMO)

## 2021-01-14 ENCOUNTER — Other Ambulatory Visit: Payer: Self-pay

## 2021-01-14 ENCOUNTER — Ambulatory Visit (INDEPENDENT_AMBULATORY_CARE_PROVIDER_SITE_OTHER): Payer: Managed Care, Other (non HMO)

## 2021-01-14 VITALS — BP 114/77 | HR 81 | Temp 98.6°F | Resp 16 | Ht 62.0 in | Wt 213.2 lb

## 2021-01-14 DIAGNOSIS — G35 Multiple sclerosis: Secondary | ICD-10-CM | POA: Diagnosis not present

## 2021-01-14 MED ORDER — FAMOTIDINE IN NACL 20-0.9 MG/50ML-% IV SOLN: 20.0000 mg | Freq: Once | INTRAVENOUS | Status: DC | PRN

## 2021-01-14 MED ORDER — HEPARIN SOD (PORK) LOCK FLUSH 100 UNIT/ML IV SOLN: 250.0000 [IU] | Freq: Once | INTRAVENOUS | Status: DC | PRN

## 2021-01-14 MED ORDER — ALBUTEROL SULFATE HFA 108 (90 BASE) MCG/ACT IN AERS: 2.0000 | INHALATION_SPRAY | Freq: Once | RESPIRATORY_TRACT | Status: DC | PRN

## 2021-01-14 MED ORDER — SODIUM CHLORIDE 0.9 % IV SOLN: Freq: Once | INTRAVENOUS | Status: DC | PRN

## 2021-01-14 MED ORDER — ANTICOAGULANT SODIUM CITRATE 4% (200MG/5ML) IV SOLN
5.0000 mL | Freq: Once | Status: DC | PRN
  Filled 2021-01-14: qty 5

## 2021-01-14 MED ORDER — SODIUM CHLORIDE 0.9 % IV SOLN
300.0000 mg | Freq: Once | INTRAVENOUS | Status: AC
  Administered 2021-01-14: 300 mg via INTRAVENOUS
  Filled 2021-01-14: qty 15

## 2021-01-14 MED ORDER — ACETAMINOPHEN 325 MG PO TABS: 650.0000 mg | ORAL_TABLET | Freq: Once | ORAL | Status: DC

## 2021-01-14 MED ORDER — SODIUM CHLORIDE 0.9% FLUSH: 10.0000 mL | Freq: Once | INTRAVENOUS | Status: DC | PRN

## 2021-01-14 MED ORDER — METHYLPREDNISOLONE SODIUM SUCC 125 MG IJ SOLR: 125.0000 mg | Freq: Once | INTRAMUSCULAR | Status: DC | PRN

## 2021-01-14 MED ORDER — EPINEPHRINE 0.3 MG/0.3ML IJ SOAJ: 0.3000 mg | Freq: Once | INTRAMUSCULAR | Status: DC | PRN

## 2021-01-14 MED ORDER — SODIUM CHLORIDE 0.9% FLUSH: 3.0000 mL | Freq: Once | INTRAVENOUS | Status: DC | PRN

## 2021-01-14 MED ORDER — ALTEPLASE 2 MG IJ SOLR: 2.0000 mg | Freq: Once | INTRAMUSCULAR | Status: DC | PRN

## 2021-01-14 MED ORDER — LORATADINE 10 MG PO TABS: 10.0000 mg | ORAL_TABLET | Freq: Once | ORAL | Status: DC

## 2021-01-14 MED ORDER — SODIUM CHLORIDE 0.9 % IV SOLN: INTRAVENOUS | Status: DC

## 2021-01-14 MED ORDER — HEPARIN SOD (PORK) LOCK FLUSH 100 UNIT/ML IV SOLN: 500.0000 [IU] | Freq: Once | INTRAVENOUS | Status: DC | PRN

## 2021-01-14 MED ORDER — DIPHENHYDRAMINE HCL 50 MG/ML IJ SOLN: 50.0000 mg | Freq: Once | INTRAMUSCULAR | Status: DC | PRN

## 2021-01-14 NOTE — Progress Notes (Signed)
Diagnosis: Multiple Sclerosis  Provider:  Marshell Garfinkel, MD  Procedure: Infusion  IV Type: Peripheral, IV Location: R Hand  Tysabri (Natalizumab), Dose: 300 mg  Infusion Start Time: 0907  Infusion Stop Time: 1010  Post Infusion IV Care: Patient declined observation and Peripheral IV Discontinued  Discharge: Condition: Good, Destination: Home . AVS provided to patient.   Performed by:  Koren Shiver, RN

## 2021-02-03 ENCOUNTER — Other Ambulatory Visit: Payer: Self-pay | Admitting: Pharmacy Technician

## 2021-02-04 ENCOUNTER — Ambulatory Visit: Payer: Managed Care, Other (non HMO)

## 2021-02-11 ENCOUNTER — Ambulatory Visit (INDEPENDENT_AMBULATORY_CARE_PROVIDER_SITE_OTHER): Payer: Managed Care, Other (non HMO)

## 2021-02-11 ENCOUNTER — Other Ambulatory Visit: Payer: Self-pay

## 2021-02-11 VITALS — BP 104/70 | HR 81 | Temp 98.7°F | Resp 81 | Ht 62.0 in | Wt 211.4 lb

## 2021-02-11 DIAGNOSIS — G35 Multiple sclerosis: Secondary | ICD-10-CM | POA: Diagnosis not present

## 2021-02-11 MED ORDER — LORATADINE 10 MG PO TABS: 10.0000 mg | ORAL_TABLET | Freq: Once | ORAL | Status: DC

## 2021-02-11 MED ORDER — ACETAMINOPHEN 325 MG PO TABS: 650.0000 mg | ORAL_TABLET | Freq: Once | ORAL | Status: DC

## 2021-02-11 MED ORDER — SODIUM CHLORIDE 0.9 % IV SOLN: INTRAVENOUS | Status: DC

## 2021-02-11 MED ORDER — DIPHENHYDRAMINE HCL 50 MG/ML IJ SOLN: 50.0000 mg | Freq: Once | INTRAMUSCULAR | Status: DC | PRN

## 2021-02-11 MED ORDER — SODIUM CHLORIDE 0.9 % IV SOLN
300.0000 mg | Freq: Once | INTRAVENOUS | Status: AC
  Administered 2021-02-11: 300 mg via INTRAVENOUS
  Filled 2021-02-11: qty 15

## 2021-02-11 MED ORDER — ALBUTEROL SULFATE HFA 108 (90 BASE) MCG/ACT IN AERS: 2.0000 | INHALATION_SPRAY | Freq: Once | RESPIRATORY_TRACT | Status: DC | PRN

## 2021-02-11 MED ORDER — EPINEPHRINE 0.3 MG/0.3ML IJ SOAJ: 0.3000 mg | Freq: Once | INTRAMUSCULAR | Status: DC | PRN

## 2021-02-11 MED ORDER — SODIUM CHLORIDE 0.9 % IV SOLN: Freq: Once | INTRAVENOUS | Status: DC | PRN

## 2021-02-11 MED ORDER — METHYLPREDNISOLONE SODIUM SUCC 125 MG IJ SOLR: 125.0000 mg | Freq: Once | INTRAMUSCULAR | Status: DC | PRN

## 2021-02-11 MED ORDER — FAMOTIDINE IN NACL 20-0.9 MG/50ML-% IV SOLN: 20.0000 mg | Freq: Once | INTRAVENOUS | Status: DC | PRN

## 2021-02-11 NOTE — Progress Notes (Signed)
Diagnosis: Multiple Sclerosis  Provider:  Marshell Garfinkel, MD  Procedure: Infusion  IV Type: Peripheral, IV Location: L Hand  Tysabri (Natalizumab), Dose: 300 mg  Infusion Start Time: 8270BE  Infusion Stop Time: 1000  Post Infusion IV Care: Patient declined observation and Peripheral IV Discontinued  Discharge: Condition: Good, Destination: Home . AVS provided to patient.   Performed by:  Koren Shiver, RN

## 2021-03-04 ENCOUNTER — Ambulatory Visit: Payer: Managed Care, Other (non HMO)

## 2021-03-10 ENCOUNTER — Other Ambulatory Visit: Payer: Self-pay | Admitting: Neurology

## 2021-03-11 ENCOUNTER — Ambulatory Visit (INDEPENDENT_AMBULATORY_CARE_PROVIDER_SITE_OTHER): Payer: Managed Care, Other (non HMO)

## 2021-03-11 ENCOUNTER — Other Ambulatory Visit: Payer: Self-pay

## 2021-03-11 VITALS — BP 111/75 | HR 88 | Temp 98.8°F | Resp 18 | Ht 62.0 in | Wt 215.4 lb

## 2021-03-11 DIAGNOSIS — G35 Multiple sclerosis: Secondary | ICD-10-CM

## 2021-03-11 MED ORDER — ALTEPLASE 2 MG IJ SOLR: 2.0000 mg | Freq: Once | INTRAMUSCULAR | Status: DC | PRN

## 2021-03-11 MED ORDER — METHYLPREDNISOLONE SODIUM SUCC 125 MG IJ SOLR: 125.0000 mg | Freq: Once | INTRAMUSCULAR | Status: DC | PRN

## 2021-03-11 MED ORDER — SODIUM CHLORIDE 0.9% FLUSH: 3.0000 mL | Freq: Once | INTRAVENOUS | Status: DC | PRN

## 2021-03-11 MED ORDER — HEPARIN SOD (PORK) LOCK FLUSH 100 UNIT/ML IV SOLN: 250.0000 [IU] | Freq: Once | INTRAVENOUS | Status: DC | PRN

## 2021-03-11 MED ORDER — HEPARIN SOD (PORK) LOCK FLUSH 100 UNIT/ML IV SOLN: 500.0000 [IU] | Freq: Once | INTRAVENOUS | Status: DC | PRN

## 2021-03-11 MED ORDER — FAMOTIDINE IN NACL 20-0.9 MG/50ML-% IV SOLN: 20.0000 mg | Freq: Once | INTRAVENOUS | Status: DC | PRN

## 2021-03-11 MED ORDER — DIPHENHYDRAMINE HCL 50 MG/ML IJ SOLN: 50.0000 mg | Freq: Once | INTRAMUSCULAR | Status: DC | PRN

## 2021-03-11 MED ORDER — SODIUM CHLORIDE 0.9 % IV SOLN: Freq: Once | INTRAVENOUS | Status: DC | PRN

## 2021-03-11 MED ORDER — LORATADINE 10 MG PO TABS: 10.0000 mg | ORAL_TABLET | Freq: Once | ORAL | Status: DC

## 2021-03-11 MED ORDER — SODIUM CHLORIDE 0.9 % IV SOLN
300.0000 mg | Freq: Once | INTRAVENOUS | Status: AC
  Administered 2021-03-11: 300 mg via INTRAVENOUS
  Filled 2021-03-11: qty 15

## 2021-03-11 MED ORDER — ANTICOAGULANT SODIUM CITRATE 4% (200MG/5ML) IV SOLN: 5.0000 mL | Freq: Once | Status: DC | PRN

## 2021-03-11 MED ORDER — ALBUTEROL SULFATE HFA 108 (90 BASE) MCG/ACT IN AERS: 2.0000 | INHALATION_SPRAY | Freq: Once | RESPIRATORY_TRACT | Status: DC | PRN

## 2021-03-11 MED ORDER — ACETAMINOPHEN 325 MG PO TABS: 650.0000 mg | ORAL_TABLET | Freq: Once | ORAL | Status: DC

## 2021-03-11 MED ORDER — EPINEPHRINE 0.3 MG/0.3ML IJ SOAJ: 0.3000 mg | Freq: Once | INTRAMUSCULAR | Status: DC | PRN

## 2021-03-11 MED ORDER — SODIUM CHLORIDE 0.9% FLUSH: 10.0000 mL | Freq: Once | INTRAVENOUS | Status: DC | PRN

## 2021-03-11 MED ORDER — SODIUM CHLORIDE 0.9 % IV SOLN: INTRAVENOUS | Status: DC

## 2021-03-11 NOTE — Progress Notes (Signed)
Diagnosis: Multiple Sclerosis ? ?Provider:  Marshell Garfinkel, MD ? ?Procedure: Infusion ? ?IV Type: Peripheral, IV Location: L Hand ? ?Tysabri (Natalizumab), Dose: 300 mg ? ?Infusion Start Time: 09.03 03/11/2021 ? ?Infusion Stop Time: 10.10 03/11/2021 ? ?Post Infusion IV Care: Peripheral IV Discontinued.Pt declined observation at this time. ? ?Discharge: Condition: Good, Destination: Home . AVS provided to patient.  ? ?Performed by:  Sibbie Flammia, RN  ?  ?

## 2021-04-01 ENCOUNTER — Ambulatory Visit: Payer: Managed Care, Other (non HMO)

## 2021-04-01 NOTE — Progress Notes (Signed)
? ? ?Follow-up Visit ? ? ?Date: 04/02/21 ?  ? ?Peggy Banks ?MRN: [id number] ?DOB: 1980/04/07 ? ? ?Interim History: ?Peggy Banks is a 41 y.o. right-handed [place] American female with hypertension returning to the clinic for follow-up of multiple sclerosis.  The patient was accompanied to the clinic by self.   ? ?She is here for 1 year visit.  She had eye exam in August and which had shown some changes in the left optic nerve.  She does not have any vision changes, eye pain, or color vision.  No numbness/tingling or weakness.  She had one fall without injuries. ? ?She remains on baclofen '10mg'$  three times daily. ? ?Medications:   ?Current Outpatient Medications on File Prior to Visit  ?Medication Sig Dispense Refill  ? acetaminophen (TYLENOL) 500 MG tablet Take 1,000 mg by mouth every 6 (six) hours as needed for mild pain or headache (PRN infusion). PRE MED FOR MS INFUSION    ? baclofen (LIORESAL) 10 MG tablet TAKE 1 TABLET BY MOUTH 3  TIMES DAILY 270 tablet 3  ? Cholecalciferol (VITAMIN D PO) Take 5,000 Units by mouth.    ? ibuprofen (ADVIL) 800 MG tablet Take 1 tablet (800 mg total) by mouth every 8 (eight) hours as needed for moderate pain. 30 tablet 1  ? lisinopril-hydrochlorothiazide (PRINZIDE,ZESTORETIC) 10-12.5 MG tablet Take 1 tablet by mouth daily.    ? loratadine (CLARITIN) 10 MG tablet Take 10 mg by mouth daily.    ? metFORMIN (GLUCOPHAGE) 500 MG tablet Take by mouth every evening.    ? Multiple Vitamin (MULTIVITAMIN) tablet Take 1 tablet by mouth daily.    ? Natalizumab (TYSABRI IV) Inject into the vein every 28 (twenty-eight) days.    ? Omega-3 Fatty Acids (FISH OIL) 1360 MG CAPS Take 2 capsules by mouth daily.    ? oseltamivir (TAMIFLU) 75 MG capsule Take 1 capsule (75 mg total) by mouth every 12 (twelve) hours. 10 capsule 0  ? oxyCODONE (ROXICODONE) 5 MG immediate release tablet Take 1 tablet (5 mg total) by mouth every 6 (six) hours as needed for severe pain. 5 tablet 0  ? rosuvastatin (CRESTOR) 10 MG  tablet Take 10 mg by mouth at bedtime.    ? ?No current facility-administered medications on file prior to visit.  ? ? ?Allergies:  ?Allergies  ?Allergen Reactions  ? Cefdinir Swelling  ? ? ?Vital Signs:  ?BP 121/81   Pulse 89   Ht '5\' 2"'$  (1.575 m)   Wt 215 lb (97.5 kg)   SpO2 97%   BMI 39.32 kg/m?  ? ?Neurological Exam: ?MENTAL STATUS including orientation to time, place, person, recent and remote memory, attention span and concentration, language, and fund of knowledge is normal.  Speech is not dysarthric. ? ?CRANIAL NERVES:  Fundoscopic exam shows mild left optic nerve atrophy, compared to the right.  Pupils round and reactive to light.  Extraocular muscles intact.  Face is symmetric. Tongue is midline.   ? ?MOTOR:  Motor strength is 5/5 in all extremities, except right foot dorsiflexion 5-/5, eversion 5-/5, inversion 5-/5.  There mild RLE spasticity. ? ?MSRs:  ?Right  Left ?brachioradialis 3+  brachioradialis 2+  ?biceps 3+  biceps 2+  ?triceps 3+  triceps 2+  ?patellar 3+  patellar 3+  ?ankle jerk 3+  ankle jerk 2+  ? ?SENSORY: Vibration is intact at the knees and MCP bilaterally ? ?COORDINATION/GAIT:  Gait is stable, minimal, if any, right knee hyperextension and trace right foot inversion. ? ?Data: ?MRI brain and cervical spine wwo contrast 04/20/2020: ?  ?1. Extensive T2/FLAIR hyperintensity involving the supratentorial and infratentorial cerebral white matter, consistent with known history of multiple sclerosis. Overall, appearance is relatively ?stable from previous, with no definite new lesions or evidence for significant disease progression. Single punctate nodular focus of enhancement about a juxta cortical lesion at the posterior parasagittal right frontal lobe consistent with active demyelination. ?2. Mildly advanced cerebral atrophy for age, stable. ?  ?MRI CERVICAL SPINE IMPRESSION: ?  ?1. Extensive patchy and confluent signal  abnormality throughout the cervical and visualized upper thoracic spinal cord, consistent with history of multiple sclerosis. No definite new lesions or evidence for significant disease progression. No active demyelination. ?2. Small central disc protrusion at C5-6 without significant stenosis, stable. ?  ?Labs 11/2020:  JCV negative ? ?IMPRESSION/PLAN: ?Relpasing remitting multiple sclerosis (diagnosed 2018, symptom onset 2005). Imaging with disease burden involving the juxtacortical, periventricular, brainstem, cerebellum, and nearly every level of the cervical and thoracic spine.  She has been clinically stable without relapse or progression on imaging since starting Tysabri in April 2018.  JCV is negative. ?Overall, she is asymptomatic except mild right knee hyperextension and spasticity, which has improved on muscle relaxants.  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   ? ?PLAN: ?1.  Continue Tysabri infusion every 4 weeks ?2.  MRI brain and cervical spine wwo contrast ?3.  Check CBC, CMP, and JCV in May and follow every 6 months ?4.  Continue vitamin D 5000 units ?5.  Continue baclofen '10mg'$  TID ? ?Return to clinic in 1 year ? ? ? ?Thank you for allowing me to participate in patient's care.  If I can answer any additional questions, I would be pleased to do so.   ? ?Sincerely, ? ? ? ?Garima Chronis K. Posey Pronto, DO ? ? ?

## 2021-04-02 ENCOUNTER — Ambulatory Visit: Payer: Managed Care, Other (non HMO) | Admitting: Neurology

## 2021-04-02 ENCOUNTER — Encounter: Payer: Self-pay | Admitting: Neurology

## 2021-04-02 ENCOUNTER — Other Ambulatory Visit: Payer: Self-pay

## 2021-04-02 VITALS — BP 121/81 | HR 89 | Ht 62.0 in | Wt 215.0 lb

## 2021-04-02 DIAGNOSIS — G35 Multiple sclerosis: Secondary | ICD-10-CM

## 2021-04-02 NOTE — Patient Instructions (Signed)
MRI brain and cervical spine  ? ?Check labs ? ?Return to clinic in 1 year ?

## 2021-04-08 ENCOUNTER — Ambulatory Visit (INDEPENDENT_AMBULATORY_CARE_PROVIDER_SITE_OTHER): Payer: Managed Care, Other (non HMO)

## 2021-04-08 ENCOUNTER — Telehealth: Payer: Self-pay | Admitting: Pharmacy Technician

## 2021-04-08 VITALS — BP 103/71 | HR 82 | Temp 97.7°F | Resp 18 | Ht 62.0 in | Wt 216.0 lb

## 2021-04-08 DIAGNOSIS — G35 Multiple sclerosis: Secondary | ICD-10-CM

## 2021-04-08 MED ORDER — ACETAMINOPHEN 325 MG PO TABS: 650.0000 mg | ORAL_TABLET | Freq: Once | ORAL | Status: DC

## 2021-04-08 MED ORDER — SODIUM CHLORIDE 0.9 % IV SOLN: INTRAVENOUS | Status: DC

## 2021-04-08 MED ORDER — SODIUM CHLORIDE 0.9 % IV SOLN
300.0000 mg | Freq: Once | INTRAVENOUS | Status: AC
  Administered 2021-04-08: 300 mg via INTRAVENOUS
  Filled 2021-04-08: qty 15

## 2021-04-08 MED ORDER — LORATADINE 10 MG PO TABS: 10.0000 mg | ORAL_TABLET | Freq: Once | ORAL | Status: DC

## 2021-04-08 NOTE — Progress Notes (Signed)
Diagnosis: Multiple Sclerosis ? ?Provider:  Marshell Garfinkel, MD ? ?Procedure: Infusion ? ?IV Type: Peripheral, IV Location: R Hand ? ?Tysabri (Natalizumab), Dose: 300 mg ? ?Infusion Start Time: 09.13 ? ?Infusion Stop Time: 10.17 ? ?Post Infusion IV Care: Peripheral IV Discontinued. Pt declined observation only stayed 20 minutes after infusion. ? ?Discharge: Condition: Good, Destination: Home . AVS provided to patient.  ? ?Performed by:  Saryiah Bencosme, RN  ?  ?

## 2021-04-08 NOTE — Telephone Encounter (Addendum)
Auth Submission: APPROVED - [place] RENEWAL ?Payer: [employer] ?Medication & CPT/J Code(s) submitted: Tysabri (Natalizumab) U9249 ?Route of submission (phone, fax, portal): PHONE: [phone] ?FAX: [phone] ?Auth type: MANUFACT ASSIST ?Units/visits requested: '300MG'$  Q28DAY ?Reference number: [id number] ?Approval from:  04/14/21 - 04/15/22 ? ? ?  ?

## 2021-04-21 ENCOUNTER — Ambulatory Visit
Admission: RE | Admit: 2021-04-21 | Discharge: 2021-04-21 | Disposition: A | Discharge status: Home / Self Care | Payer: Managed Care, Other (non HMO) | Source: Ambulatory Visit | Attending: Neurology | Admitting: Neurology

## 2021-04-21 ENCOUNTER — Encounter: Payer: Self-pay | Admitting: Neurology

## 2021-04-21 DIAGNOSIS — G35 Multiple sclerosis: Secondary | ICD-10-CM

## 2021-04-21 MED ORDER — GADOBENATE DIMEGLUMINE 529 MG/ML IV SOLN
20.0000 mL | Freq: Once | INTRAVENOUS | Status: AC | PRN
  Administered 2021-04-21: 20 mL via INTRAVENOUS

## 2021-05-06 ENCOUNTER — Ambulatory Visit (INDEPENDENT_AMBULATORY_CARE_PROVIDER_SITE_OTHER): Payer: Managed Care, Other (non HMO)

## 2021-05-06 VITALS — BP 128/77 | HR 90 | Temp 97.6°F | Resp 16 | Ht 62.0 in | Wt 215.0 lb

## 2021-05-06 DIAGNOSIS — G35 Multiple sclerosis: Secondary | ICD-10-CM

## 2021-05-06 MED ORDER — SODIUM CHLORIDE 0.9 % IV SOLN: INTRAVENOUS | Status: DC

## 2021-05-06 MED ORDER — SODIUM CHLORIDE 0.9 % IV SOLN
300.0000 mg | Freq: Once | INTRAVENOUS | Status: AC
  Administered 2021-05-06: 300 mg via INTRAVENOUS
  Filled 2021-05-06: qty 15

## 2021-05-06 MED ORDER — LORATADINE 10 MG PO TABS: 10.0000 mg | ORAL_TABLET | Freq: Once | ORAL | Status: DC

## 2021-05-06 MED ORDER — ACETAMINOPHEN 325 MG PO TABS: 650.0000 mg | ORAL_TABLET | Freq: Once | ORAL | Status: DC

## 2021-05-06 NOTE — Progress Notes (Signed)
Diagnosis: Multiple Sclerosis ? ?Provider:  Marshell Garfinkel, MD ? ?Procedure: Infusion ? ?IV Type: Peripheral, IV Location: L Hand ? ?Tysabri (Natalizumab),, Dose: 300 mg ? ?Infusion Start Time: [id number] ? ?Infusion Stop Time: 7416 ? ?Post Infusion IV Care: Observation period completed and Peripheral IV Discontinued ? ?Discharge: Condition: Good, Destination: Home . AVS provided to patient.  ? ?Performed by:  Hazleigh Mccleave, Sherlon Handing, LPN  ?  ?

## 2021-06-03 ENCOUNTER — Ambulatory Visit (INDEPENDENT_AMBULATORY_CARE_PROVIDER_SITE_OTHER): Payer: Managed Care, Other (non HMO)

## 2021-06-03 ENCOUNTER — Other Ambulatory Visit (INDEPENDENT_AMBULATORY_CARE_PROVIDER_SITE_OTHER): Payer: Managed Care, Other (non HMO)

## 2021-06-03 VITALS — BP 99/66 | HR 74 | Temp 97.7°F | Resp 16 | Ht 62.0 in | Wt 212.8 lb

## 2021-06-03 DIAGNOSIS — G35 Multiple sclerosis: Secondary | ICD-10-CM | POA: Diagnosis not present

## 2021-06-03 LAB — COMPREHENSIVE METABOLIC PANEL
ALT: 15 U/L (ref 0–35)
AST: 15 U/L (ref 0–37)
Albumin: 4.4 g/dL (ref 3.5–5.2)
Alkaline Phosphatase: 44 U/L (ref 39–117)
BUN: 13 mg/dL (ref 6–23)
CO2: 29 mEq/L (ref 19–32)
Calcium: 9.8 mg/dL (ref 8.4–10.5)
Chloride: 101 mEq/L (ref 96–112)
Creatinine, Ser: 0.65 mg/dL (ref 0.40–1.20)
GFR: 110.02 mL/min (ref >60.00)
Glucose, Bld: 114 mg/dL — ABNORMAL HIGH (ref 70–99)
Potassium: 3.8 mEq/L (ref 3.5–5.1)
Sodium: 138 mEq/L (ref 135–145)
Total Bilirubin: 0.3 mg/dL (ref 0.2–1.2)
Total Protein: 7.7 g/dL (ref 6.0–8.3)

## 2021-06-03 LAB — CBC
HCT: 33.1 % — ABNORMAL LOW (ref 36.0–46.0)
Hemoglobin: 10.7 g/dL — ABNORMAL LOW (ref 12.0–15.0)
MCHC: 32.4 g/dL (ref 30.0–36.0)
MCV: 80 fl (ref 78.0–100.0)
Platelets: 349 10*3/uL (ref 150.0–400.0)
RBC: 4.14 Mil/uL (ref 3.87–5.11)
RDW: 15.4 % (ref 11.5–15.5)
WBC: 4.6 10*3/uL (ref 4.0–10.5)

## 2021-06-03 MED ORDER — LORATADINE 10 MG PO TABS: 10.0000 mg | ORAL_TABLET | Freq: Once | ORAL | Status: DC

## 2021-06-03 MED ORDER — SODIUM CHLORIDE 0.9 % IV SOLN: INTRAVENOUS | Status: DC

## 2021-06-03 MED ORDER — SODIUM CHLORIDE 0.9 % IV SOLN
300.0000 mg | Freq: Once | INTRAVENOUS | Status: AC
  Administered 2021-06-03: 300 mg via INTRAVENOUS
  Filled 2021-06-03: qty 15

## 2021-06-03 MED ORDER — ACETAMINOPHEN 325 MG PO TABS: 650.0000 mg | ORAL_TABLET | Freq: Once | ORAL | Status: DC

## 2021-06-03 NOTE — Progress Notes (Signed)
Diagnosis: Multiple Sclerosis  Provider:  Marshell Garfinkel, MD  Procedure: Infusion  IV Type: Peripheral, IV Location: L Hand  Tysabri (Natalizumab), Dose: 300 mg  Infusion Start Time: 7159  Infusion Stop Time: 1000  Post Infusion IV Care: Patient declined observation and Peripheral IV Discontinued  Discharge: Condition: Good, Destination: Home . AVS declined.  Performed by:  Adelina Mings, LPN

## 2021-06-10 LAB — STRATIFY JCV AB (W/ INDEX) W/ RFLX
Index Value: 0.25 — ABNORMAL HIGH
Stratify JCV (TM) Ab w/Reflex Inhibition: UNDETERMINED — AB

## 2021-06-10 LAB — RFLX STRATIFY JCV (TM) AB INHIBITION: JCV Antibody by Inhibition: NEGATIVE

## 2021-07-01 ENCOUNTER — Ambulatory Visit (INDEPENDENT_AMBULATORY_CARE_PROVIDER_SITE_OTHER): Payer: Managed Care, Other (non HMO)

## 2021-07-01 VITALS — BP 102/70 | HR 85 | Temp 97.8°F | Resp 18 | Ht 62.0 in | Wt 208.4 lb

## 2021-07-01 DIAGNOSIS — G35 Multiple sclerosis: Secondary | ICD-10-CM | POA: Diagnosis not present

## 2021-07-01 MED ORDER — ACETAMINOPHEN 325 MG PO TABS: 650.0000 mg | ORAL_TABLET | Freq: Once | ORAL | Status: DC

## 2021-07-01 MED ORDER — LORATADINE 10 MG PO TABS: 10.0000 mg | ORAL_TABLET | Freq: Once | ORAL | Status: DC

## 2021-07-01 MED ORDER — SODIUM CHLORIDE 0.9 % IV SOLN: INTRAVENOUS | Status: DC

## 2021-07-01 MED ORDER — SODIUM CHLORIDE 0.9 % IV SOLN
300.0000 mg | Freq: Once | INTRAVENOUS | Status: AC
  Administered 2021-07-01: 300 mg via INTRAVENOUS
  Filled 2021-07-01: qty 15

## 2021-07-01 NOTE — Progress Notes (Signed)
Diagnosis: Multiple Sclerosis  Provider:  Marshell Garfinkel, MD  Procedure: Infusion  IV Type: Peripheral, IV Location: L Hand  Tysabri (Natalizumab), Dose: 300 mg  Infusion Start Time: 0909  Infusion Stop Time: 1013  Post Infusion IV Care: Peripheral IV Discontinued.Pt refused observation.   Discharge: Condition: Good, Destination: Home . AVS provided to patient.   Performed by:  Arnoldo Morale, RN

## 2021-07-29 ENCOUNTER — Ambulatory Visit (INDEPENDENT_AMBULATORY_CARE_PROVIDER_SITE_OTHER): Payer: Managed Care, Other (non HMO)

## 2021-07-29 VITALS — BP 102/70 | HR 75 | Temp 98.2°F | Resp 18 | Ht 62.0 in | Wt 207.0 lb

## 2021-07-29 DIAGNOSIS — G35 Multiple sclerosis: Secondary | ICD-10-CM

## 2021-07-29 MED ORDER — ACETAMINOPHEN 325 MG PO TABS: 650.0000 mg | ORAL_TABLET | Freq: Once | ORAL | Status: DC

## 2021-07-29 MED ORDER — LORATADINE 10 MG PO TABS: 10.0000 mg | ORAL_TABLET | Freq: Once | ORAL | Status: DC

## 2021-07-29 MED ORDER — SODIUM CHLORIDE 0.9 % IV SOLN: INTRAVENOUS | Status: DC

## 2021-07-29 MED ORDER — SODIUM CHLORIDE 0.9 % IV SOLN
300.0000 mg | Freq: Once | INTRAVENOUS | Status: AC
  Administered 2021-07-29: 300 mg via INTRAVENOUS
  Filled 2021-07-29: qty 15

## 2021-07-29 NOTE — Progress Notes (Signed)
Diagnosis: Multiple Sclerosis  Provider:  Marshell Garfinkel, MD  Procedure: Infusion  IV Type: Peripheral, IV Location: L Hand  Tysabri (Natalizumab), Dose: 300 mg  Infusion Start Time: 0904  Infusion Stop Time: 2003  Post Infusion IV Care: Patient declined observation and Peripheral IV Discontinued  Discharge: Condition: Good, Destination: Home . AVS provided to patient.   Performed by:  Koren Shiver, RN

## 2021-08-26 ENCOUNTER — Ambulatory Visit (INDEPENDENT_AMBULATORY_CARE_PROVIDER_SITE_OTHER): Payer: Managed Care, Other (non HMO)

## 2021-08-26 VITALS — BP 109/74 | HR 78 | Temp 97.9°F | Resp 18 | Ht 62.0 in | Wt 207.4 lb

## 2021-08-26 DIAGNOSIS — G35 Multiple sclerosis: Secondary | ICD-10-CM

## 2021-08-26 MED ORDER — LORATADINE 10 MG PO TABS: 10.0000 mg | ORAL_TABLET | Freq: Once | ORAL | Status: DC

## 2021-08-26 MED ORDER — SODIUM CHLORIDE 0.9 % IV SOLN
300.0000 mg | Freq: Once | INTRAVENOUS | Status: AC
  Administered 2021-08-26: 300 mg via INTRAVENOUS
  Filled 2021-08-26: qty 15

## 2021-08-26 MED ORDER — ACETAMINOPHEN 325 MG PO TABS: 650.0000 mg | ORAL_TABLET | Freq: Once | ORAL | Status: DC

## 2021-08-26 MED ORDER — SODIUM CHLORIDE 0.9 % IV SOLN: INTRAVENOUS | Status: DC

## 2021-08-26 NOTE — Progress Notes (Signed)
Diagnosis: Multiple Sclerosis  Provider:  Marshell Garfinkel MD  Procedure: Infusion  IV Type: Peripheral, IV Location: L Hand  Tysabri (Natalizumab), Dose: 300 mg  Infusion Start Time: 0903  Infusion Stop Time: 1008  Post Infusion IV Care: Patient declined observation and Peripheral IV Discontinued  Discharge: Condition: Good, Destination: Home . AVS provided to patient.   Performed by:  Adelina Mings, LPN

## 2021-09-23 ENCOUNTER — Ambulatory Visit (INDEPENDENT_AMBULATORY_CARE_PROVIDER_SITE_OTHER): Payer: Managed Care, Other (non HMO)

## 2021-09-23 VITALS — BP 102/71 | HR 75 | Temp 98.3°F | Resp 18 | Ht 62.0 in | Wt 209.4 lb

## 2021-09-23 DIAGNOSIS — G35 Multiple sclerosis: Secondary | ICD-10-CM

## 2021-09-23 DIAGNOSIS — G35A Relapsing-remitting multiple sclerosis: Secondary | ICD-10-CM

## 2021-09-23 MED ORDER — SODIUM CHLORIDE 0.9 % IV SOLN: INTRAVENOUS | Status: DC

## 2021-09-23 MED ORDER — LORATADINE 10 MG PO TABS: 10.0000 mg | ORAL_TABLET | Freq: Once | ORAL | Status: DC

## 2021-09-23 MED ORDER — ACETAMINOPHEN 325 MG PO TABS: 650.0000 mg | ORAL_TABLET | Freq: Once | ORAL | Status: DC

## 2021-09-23 MED ORDER — SODIUM CHLORIDE 0.9 % IV SOLN
300.0000 mg | Freq: Once | INTRAVENOUS | Status: AC
  Administered 2021-09-23: 300 mg via INTRAVENOUS
  Filled 2021-09-23: qty 15

## 2021-09-23 NOTE — Progress Notes (Signed)
Diagnosis: Multiple Sclerosis  Provider:  Marshell Garfinkel MD  Procedure: Infusion  IV Type: Peripheral, IV Location: R Antecubital  Tysabri (Natalizumab), Dose: 300 mg  Infusion Start Time: 8871  Infusion Stop Time: 9597  Post Infusion IV Care: Patient declined observation and Peripheral IV Discontinued  Discharge: Condition: Good, Destination: Home . AVS provided to patient.   Performed by:  Adelina Mings, LPN

## 2021-10-21 ENCOUNTER — Ambulatory Visit (INDEPENDENT_AMBULATORY_CARE_PROVIDER_SITE_OTHER): Payer: Managed Care, Other (non HMO)

## 2021-10-21 VITALS — BP 105/71 | HR 76 | Temp 98.2°F | Resp 16 | Ht 62.0 in | Wt 206.6 lb

## 2021-10-21 DIAGNOSIS — G35 Multiple sclerosis: Secondary | ICD-10-CM

## 2021-10-21 MED ORDER — LORATADINE 10 MG PO TABS: 10.0000 mg | ORAL_TABLET | Freq: Once | ORAL | Status: DC

## 2021-10-21 MED ORDER — ACETAMINOPHEN 325 MG PO TABS: 650.0000 mg | ORAL_TABLET | Freq: Once | ORAL | Status: DC

## 2021-10-21 MED ORDER — SODIUM CHLORIDE 0.9 % IV SOLN
300.0000 mg | Freq: Once | INTRAVENOUS | Status: AC
  Administered 2021-10-21: 300 mg via INTRAVENOUS
  Filled 2021-10-21: qty 15

## 2021-10-21 NOTE — Progress Notes (Signed)
Diagnosis: Multiple Sclerosis  Provider:  Marshell Garfinkel MD  Procedure: Infusion  IV Type: Peripheral, IV Location: R Hand  Tysabri (Natalizumab), Dose: 300 mg  Infusion Start Time: 4801  Infusion Stop Time: 1010  Post Infusion IV Care: Patient declined observation and Peripheral IV Discontinued  Discharge: Condition: Good, Destination: Home . AVS provided to patient.   Performed by:  Adelina Mings, LPN

## 2021-11-17 ENCOUNTER — Other Ambulatory Visit: Payer: Self-pay | Admitting: Internal Medicine

## 2021-11-17 DIAGNOSIS — Z1231 Encounter for screening mammogram for malignant neoplasm of breast: Secondary | ICD-10-CM

## 2021-11-18 ENCOUNTER — Ambulatory Visit (INDEPENDENT_AMBULATORY_CARE_PROVIDER_SITE_OTHER): Payer: Managed Care, Other (non HMO)

## 2021-11-18 ENCOUNTER — Other Ambulatory Visit (INDEPENDENT_AMBULATORY_CARE_PROVIDER_SITE_OTHER): Payer: Managed Care, Other (non HMO)

## 2021-11-18 VITALS — BP 113/73 | HR 63 | Temp 98.0°F | Resp 18 | Ht 62.0 in | Wt 206.2 lb

## 2021-11-18 DIAGNOSIS — G35 Multiple sclerosis: Secondary | ICD-10-CM

## 2021-11-18 LAB — CBC WITH DIFFERENTIAL/PLATELET
Basophils Absolute: 0 10*3/uL (ref 0.0–0.1)
Basophils Relative: 0.7 % (ref 0.0–3.0)
Eosinophils Absolute: 0.1 10*3/uL (ref 0.0–0.7)
Eosinophils Relative: 1.6 % (ref 0.0–5.0)
HCT: 31.7 % — ABNORMAL LOW (ref 36.0–46.0)
Hemoglobin: 10.2 g/dL — ABNORMAL LOW (ref 12.0–15.0)
Lymphocytes Relative: 44.5 % (ref 12.0–46.0)
Lymphs Abs: 2.3 10*3/uL (ref 0.7–4.0)
MCHC: 32.2 g/dL (ref 30.0–36.0)
MCV: 81.8 fl (ref 78.0–100.0)
Monocytes Absolute: 0.4 10*3/uL (ref 0.1–1.0)
Monocytes Relative: 7.7 % (ref 3.0–12.0)
Neutro Abs: 2.4 10*3/uL (ref 1.4–7.7)
Neutrophils Relative %: 45.5 % (ref 43.0–77.0)
Platelets: 319 10*3/uL (ref 150.0–400.0)
RBC: 3.87 Mil/uL (ref 3.87–5.11)
RDW: 15 % (ref 11.5–15.5)
WBC: 5.2 10*3/uL (ref 4.0–10.5)

## 2021-11-18 LAB — COMPREHENSIVE METABOLIC PANEL
ALT: 13 U/L (ref 0–35)
AST: 14 U/L (ref 0–37)
Albumin: 4.1 g/dL (ref 3.5–5.2)
Alkaline Phosphatase: 36 U/L — ABNORMAL LOW (ref 39–117)
BUN: 12 mg/dL (ref 6–23)
CO2: 27 mEq/L (ref 19–32)
Calcium: 9 mg/dL (ref 8.4–10.5)
Chloride: 104 mEq/L (ref 96–112)
Creatinine, Ser: 0.62 mg/dL (ref 0.40–1.20)
GFR: 110.92 mL/min (ref >60.00)
Glucose, Bld: 99 mg/dL (ref 70–99)
Potassium: 3.7 mEq/L (ref 3.5–5.1)
Sodium: 136 mEq/L (ref 135–145)
Total Bilirubin: 0.2 mg/dL (ref 0.2–1.2)
Total Protein: 7.4 g/dL (ref 6.0–8.3)

## 2021-11-18 MED ORDER — ACETAMINOPHEN 325 MG PO TABS: 650.0000 mg | ORAL_TABLET | Freq: Once | ORAL | Status: DC

## 2021-11-18 MED ORDER — SODIUM CHLORIDE 0.9 % IV SOLN: INTRAVENOUS | Status: DC

## 2021-11-18 MED ORDER — SODIUM CHLORIDE 0.9 % IV SOLN
300.0000 mg | Freq: Once | INTRAVENOUS | Status: AC
  Administered 2021-11-18: 300 mg via INTRAVENOUS
  Filled 2021-11-18: qty 15

## 2021-11-18 MED ORDER — LORATADINE 10 MG PO TABS: 10.0000 mg | ORAL_TABLET | Freq: Once | ORAL | Status: DC

## 2021-11-18 NOTE — Progress Notes (Signed)
Diagnosis: Multiple Sclerosis  Provider:  Marshell Garfinkel MD  Procedure: Infusion  IV Type: Peripheral, IV Location: L Antecubital  Tysabri (Natalizumab), Dose: 300 mg  Infusion Start Time: 0911  Infusion Stop Time: 9977  Post Infusion IV Care: Patient declined observation and Peripheral IV Discontinued  Discharge: Condition: Good, Destination: Home . AVS provided to patient.   Performed by:  Koren Shiver, RN

## 2021-11-20 ENCOUNTER — Ambulatory Visit
Admission: RE | Admit: 2021-11-20 | Discharge: 2021-11-20 | Disposition: A | Discharge status: Home / Self Care | Payer: Managed Care, Other (non HMO) | Source: Ambulatory Visit | Attending: Internal Medicine | Admitting: Internal Medicine

## 2021-11-20 DIAGNOSIS — Z1231 Encounter for screening mammogram for malignant neoplasm of breast: Secondary | ICD-10-CM

## 2021-11-25 ENCOUNTER — Other Ambulatory Visit: Payer: Self-pay | Admitting: Internal Medicine

## 2021-11-25 DIAGNOSIS — R928 Other abnormal and inconclusive findings on diagnostic imaging of breast: Secondary | ICD-10-CM

## 2021-12-02 LAB — STRATIFY JCV AB (W/ INDEX) W/ RFLX
Index Value: 0.89 — ABNORMAL HIGH
Stratify JCV (TM) Ab w/Reflex Inhibition: POSITIVE — AB

## 2021-12-10 ENCOUNTER — Ambulatory Visit
Admission: RE | Admit: 2021-12-10 | Discharge: 2021-12-10 | Disposition: A | Discharge status: Home / Self Care | Payer: Managed Care, Other (non HMO) | Source: Ambulatory Visit | Attending: Internal Medicine | Admitting: Internal Medicine

## 2021-12-10 ENCOUNTER — Ambulatory Visit: Payer: Managed Care, Other (non HMO)

## 2021-12-10 DIAGNOSIS — R928 Other abnormal and inconclusive findings on diagnostic imaging of breast: Secondary | ICD-10-CM

## 2021-12-16 ENCOUNTER — Ambulatory Visit (INDEPENDENT_AMBULATORY_CARE_PROVIDER_SITE_OTHER): Payer: Managed Care, Other (non HMO)

## 2021-12-16 VITALS — BP 103/70 | HR 77 | Temp 97.7°F | Resp 18 | Ht 62.0 in | Wt 203.8 lb

## 2021-12-16 DIAGNOSIS — G35 Multiple sclerosis: Secondary | ICD-10-CM | POA: Diagnosis not present

## 2021-12-16 MED ORDER — SODIUM CHLORIDE 0.9 % IV SOLN
300.0000 mg | Freq: Once | INTRAVENOUS | Status: AC
  Administered 2021-12-16: 300 mg via INTRAVENOUS
  Filled 2021-12-16: qty 15

## 2021-12-16 MED ORDER — SODIUM CHLORIDE 0.9 % IV SOLN: INTRAVENOUS | Status: DC

## 2021-12-16 MED ORDER — ACETAMINOPHEN 325 MG PO TABS: 650.0000 mg | ORAL_TABLET | Freq: Once | ORAL | Status: DC

## 2021-12-16 MED ORDER — LORATADINE 10 MG PO TABS: 10.0000 mg | ORAL_TABLET | Freq: Once | ORAL | Status: DC

## 2021-12-16 NOTE — Progress Notes (Signed)
Diagnosis: Multiple Sclerosis  Provider:  Marshell Garfinkel MD  Procedure: Infusion  IV Type: Peripheral, IV Location: L Hand  Tysabri (Natalizumab), Dose: 300 mg  Infusion Start Time: 0903  Infusion Stop Time: 1012  Post Infusion IV Care: Peripheral IV Discontinued and Pt refused  observation.  Discharge: Condition: Good, Destination: Home . AVS provided to patient.   Performed by:  Arnoldo Morale, RN

## 2022-01-13 ENCOUNTER — Ambulatory Visit (INDEPENDENT_AMBULATORY_CARE_PROVIDER_SITE_OTHER): Payer: Managed Care, Other (non HMO)

## 2022-01-13 VITALS — BP 107/70 | HR 85 | Temp 98.3°F | Resp 18 | Ht 62.0 in | Wt 201.2 lb

## 2022-01-13 DIAGNOSIS — G35 Multiple sclerosis: Secondary | ICD-10-CM | POA: Diagnosis not present

## 2022-01-13 MED ORDER — LORATADINE 10 MG PO TABS: 10.0000 mg | ORAL_TABLET | Freq: Once | ORAL | Status: DC

## 2022-01-13 MED ORDER — SODIUM CHLORIDE 0.9 % IV SOLN
300.0000 mg | Freq: Once | INTRAVENOUS | Status: AC
  Administered 2022-01-13: 300 mg via INTRAVENOUS
  Filled 2022-01-13: qty 15

## 2022-01-13 MED ORDER — ACETAMINOPHEN 325 MG PO TABS: 650.0000 mg | ORAL_TABLET | Freq: Once | ORAL | Status: DC

## 2022-01-13 MED ORDER — SODIUM CHLORIDE 0.9 % IV SOLN: INTRAVENOUS | Status: DC

## 2022-01-13 NOTE — Progress Notes (Signed)
Diagnosis: Multiple Sclerosis  Provider:  Marshell Garfinkel MD  Procedure: Infusion  IV Type: Peripheral, IV Location: L Hand  Tysabri (Natalizumab), Dose: 300 mg  Infusion Start Time: 0122  Infusion Stop Time: 2411  Post Infusion IV Care: Patient declined observation and Peripheral IV Discontinued  Discharge: Condition: Good, Destination: Home . AVS provided to patient.   Performed by:  Adelina Mings, LPN

## 2022-02-10 ENCOUNTER — Ambulatory Visit (INDEPENDENT_AMBULATORY_CARE_PROVIDER_SITE_OTHER): Payer: Managed Care, Other (non HMO)

## 2022-02-10 VITALS — BP 108/74 | HR 72 | Temp 97.4°F | Resp 18 | Ht 62.0 in | Wt 199.2 lb

## 2022-02-10 DIAGNOSIS — G35 Multiple sclerosis: Secondary | ICD-10-CM | POA: Diagnosis not present

## 2022-02-10 DIAGNOSIS — R928 Other abnormal and inconclusive findings on diagnostic imaging of breast: Secondary | ICD-10-CM

## 2022-02-10 MED ORDER — SODIUM CHLORIDE 0.9 % IV SOLN: INTRAVENOUS | Status: DC

## 2022-02-10 MED ORDER — LORATADINE 10 MG PO TABS: 10.0000 mg | ORAL_TABLET | Freq: Once | ORAL | Status: DC

## 2022-02-10 MED ORDER — SODIUM CHLORIDE 0.9 % IV SOLN
300.0000 mg | Freq: Once | INTRAVENOUS | Status: AC
  Administered 2022-02-10: 300 mg via INTRAVENOUS
  Filled 2022-02-10: qty 15

## 2022-02-10 MED ORDER — ACETAMINOPHEN 325 MG PO TABS: 650.0000 mg | ORAL_TABLET | Freq: Once | ORAL | Status: DC

## 2022-02-10 NOTE — Progress Notes (Signed)
Diagnosis: Multiple Sclerosis  Provider:  Marshell Garfinkel MD  Procedure: Infusion  IV Type: Peripheral, IV Location: L Hand  Tysabri (Natalizumab), Dose: 300 mg  Infusion Start Time: 4982  Infusion Stop Time: 0957  Post Infusion IV Care: Patient declined observation and Peripheral IV Discontinued  Discharge: Condition: Good, Destination: Home . AVS provided to patient.   Performed by:  Arnoldo Morale, RN

## 2022-03-03 ENCOUNTER — Encounter: Payer: Self-pay | Admitting: Neurology

## 2022-03-10 ENCOUNTER — Ambulatory Visit (INDEPENDENT_AMBULATORY_CARE_PROVIDER_SITE_OTHER): Payer: Managed Care, Other (non HMO)

## 2022-03-10 VITALS — BP 108/71 | HR 80 | Temp 98.3°F | Resp 18 | Ht 62.0 in | Wt 200.0 lb

## 2022-03-10 DIAGNOSIS — G35 Multiple sclerosis: Secondary | ICD-10-CM | POA: Diagnosis not present

## 2022-03-10 MED ORDER — ACETAMINOPHEN 325 MG PO TABS: 650.0000 mg | ORAL_TABLET | Freq: Once | ORAL | Status: DC

## 2022-03-10 MED ORDER — LORATADINE 10 MG PO TABS: 10.0000 mg | ORAL_TABLET | Freq: Once | ORAL | Status: DC

## 2022-03-10 MED ORDER — SODIUM CHLORIDE 0.9 % IV SOLN
300.0000 mg | Freq: Once | INTRAVENOUS | Status: AC
  Administered 2022-03-10: 300 mg via INTRAVENOUS
  Filled 2022-03-10: qty 15

## 2022-03-10 NOTE — Progress Notes (Signed)
Diagnosis: Multiple Sclerosis  Provider:  Marshell Garfinkel MD  Procedure: Infusion  IV Type: Peripheral, IV Location: L Hand  Tysabri (Natalizumab), Dose: 300 mg  Infusion Start Time: [id number]  Infusion Stop Time: 1101  Post Infusion IV Care: Patient declined observation and Peripheral IV Discontinued  Discharge: Condition: Stable, Destination: Home . AVS Declined  Performed by:  Binnie Kand, RN

## 2022-03-11 ENCOUNTER — Other Ambulatory Visit: Payer: Self-pay | Admitting: Neurology

## 2022-03-12 ENCOUNTER — Other Ambulatory Visit: Payer: Self-pay | Admitting: Nurse Practitioner

## 2022-03-12 ENCOUNTER — Other Ambulatory Visit (HOSPITAL_COMMUNITY)
Admission: RE | Admit: 2022-03-12 | Discharge: 2022-03-12 | Disposition: A | Discharge status: Home / Self Care | Payer: Managed Care, Other (non HMO) | Source: Ambulatory Visit | Attending: Nurse Practitioner | Admitting: Nurse Practitioner

## 2022-03-12 DIAGNOSIS — Z124 Encounter for screening for malignant neoplasm of cervix: Secondary | ICD-10-CM | POA: Diagnosis present

## 2022-03-16 LAB — CYTOLOGY - PAP
Comment: NEGATIVE
Diagnosis: NEGATIVE
High risk HPV: NEGATIVE

## 2022-04-06 ENCOUNTER — Encounter: Payer: Self-pay | Admitting: Neurology

## 2022-04-06 ENCOUNTER — Other Ambulatory Visit (INDEPENDENT_AMBULATORY_CARE_PROVIDER_SITE_OTHER): Payer: Managed Care, Other (non HMO)

## 2022-04-06 ENCOUNTER — Ambulatory Visit: Payer: Managed Care, Other (non HMO) | Admitting: Neurology

## 2022-04-06 VITALS — BP 121/83 | HR 87 | Ht 62.0 in | Wt 199.0 lb

## 2022-04-06 DIAGNOSIS — G35 Multiple sclerosis: Secondary | ICD-10-CM | POA: Diagnosis not present

## 2022-04-06 LAB — COMPREHENSIVE METABOLIC PANEL
ALT: 14 U/L (ref 0–35)
AST: 16 U/L (ref 0–37)
Albumin: 4.2 g/dL (ref 3.5–5.2)
Alkaline Phosphatase: 38 U/L — ABNORMAL LOW (ref 39–117)
BUN: 10 mg/dL (ref 6–23)
CO2: 27 mEq/L (ref 19–32)
Calcium: 9.4 mg/dL (ref 8.4–10.5)
Chloride: 105 mEq/L (ref 96–112)
Creatinine, Ser: 0.6 mg/dL (ref 0.40–1.20)
GFR: 111.51 mL/min (ref >60.00)
Glucose, Bld: 90 mg/dL (ref 70–99)
Potassium: 3.8 mEq/L (ref 3.5–5.1)
Sodium: 136 mEq/L (ref 135–145)
Total Bilirubin: 0.3 mg/dL (ref 0.2–1.2)
Total Protein: 7.4 g/dL (ref 6.0–8.3)

## 2022-04-06 LAB — CBC
HCT: 32.7 % — ABNORMAL LOW (ref 36.0–46.0)
Hemoglobin: 10.9 g/dL — ABNORMAL LOW (ref 12.0–15.0)
MCHC: 33.4 g/dL (ref 30.0–36.0)
MCV: 81.8 fl (ref 78.0–100.0)
Platelets: 300 10*3/uL (ref 150.0–400.0)
RBC: 4 Mil/uL (ref 3.87–5.11)
RDW: 15.4 % (ref 11.5–15.5)
WBC: 5.1 10*3/uL (ref 4.0–10.5)

## 2022-04-06 NOTE — Patient Instructions (Addendum)
MRI brain and cervical spine will be ordered  Check labs  Return to clinic 1 year

## 2022-04-06 NOTE — Progress Notes (Signed)
Follow-up Visit   Date: 04/06/22    Peggy Banks MRN: [id number] DOB: [birth year]   Interim History: Peggy Banks is a 42 y.o. right-handed African American female with hypertension returning to the clinic for follow-up of multiple sclerosis.  The patient was accompanied to the clinic by self.    IMPRESSION: Relapsing remitting multiple sclerosis (diagnosed 2018, symptom onset 2005).   Disease burden involves juxtacortical, periventricular, brainstem, cerebellum, and nearly every level of the cervical and thoracic spine.  Tysabri was started in April 2018 and she has been doing great.  JCV has been negative, however, her last titer was slightly higher, but did not reflex to stratify JCV, so I will repeat her labs today.   Overall, she is asymptomatic except mild right knee hyperextension and spasticity, which has improved on muscle relaxants.  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    PLAN: 1.  Check JCV antibody, CMP, and CBC today and every 6 months 2.  MRI brain and cervical spine wwo contrast 3.  Continue Tysabri every 4 weeks 4.  Continue vitamin D 5000 units 5.  Continue baclofen 10mg  three times daily   Return to clinic in 1 year -------------------------------------------------  UPDATE 04/06/2022:  She is here for follow-up visit.  She has been doing well over the past year and denies any new neurological symptoms.  She had eye exam last month which was stable.  No new vision changes, weakness, or numbness/tingling. She was started on Ozempic for diabetes.  She remains on baclofen 10mg  three times daily, which continues to help with leg stability and stiffness.  JCV titer was positive at 0.89 in November, however, it's unclear to me why lab did not reflex stratify the titer.    Medications:   Current Outpatient Medications on File Prior to Visit  Medication Sig Dispense Refill   acetaminophen (TYLENOL) 500 MG tablet Take  1,000 mg by mouth every 6 (six) hours as needed for mild pain or headache (PRN infusion). PRE MED FOR MS INFUSION     baclofen (LIORESAL) 10 MG tablet TAKE 1 TABLET BY MOUTH 3 TIMES  DAILY 270 tablet 3   Cholecalciferol (VITAMIN D PO) Take 5,000 Units by mouth.     ibuprofen (ADVIL) 800 MG tablet Take 1 tablet (800 mg total) by mouth every 8 (eight) hours as needed for moderate pain. 30 tablet 1   lisinopril-hydrochlorothiazide (PRINZIDE,ZESTORETIC) 10-12.5 MG tablet Take 1 tablet by mouth daily.     loratadine (CLARITIN) 10 MG tablet Take 10 mg by mouth daily.     metFORMIN (GLUCOPHAGE) 500 MG tablet Take by mouth every evening.     Multiple Vitamin (MULTIVITAMIN) tablet Take 1 tablet by mouth daily.     Natalizumab (TYSABRI IV) Inject into the vein every 28 (twenty-eight) days.     Omega-3 Fatty Acids (FISH OIL) 1360 MG CAPS Take 2 capsules by mouth daily.     rosuvastatin (CRESTOR) 10 MG tablet Take 10 mg by mouth at bedtime.     No current facility-administered medications on file prior to visit.    Allergies:  Allergies  Allergen Reactions   Cefdinir Swelling    Vital Signs:  BP 121/83   Pulse 87   Ht 5\' 2"  (1.575 m)   Wt 199 lb (90.3 kg)   SpO2 98%   BMI 36.40 kg/m   Neurological Exam: MENTAL STATUS including orientation to time, place, person, recent and remote memory, attention span  and concentration, language, and fund of knowledge is normal.  Speech is not dysarthric.  CRANIAL NERVES:   Pupils round and reactive to light.  Extraocular muscles intact.  Face is symmetric. Tongue is midline.    MOTOR:  Motor strength is 5/5 in all extremities, except right foot dorsiflexion 5-/5, eversion 5-/5, inversion 5-/5.  There mild RLE spasticity.  MSRs:  Right                                                                 Left brachioradialis 3+  brachioradialis 2+  biceps 3+  biceps 2+  triceps 3+  triceps 2+  patellar 3+  patellar 3+  ankle jerk 3+  ankle jerk 2+    SENSORY: Vibration is intact at the knees and MCP bilaterally  COORDINATION/GAIT:  Gait is stable, minimal, if any, right knee hyperextension and trace right foot inversion.  Data: MRI brain and cervical spine wwo contrast 04/20/2020:   1. Extensive T2/FLAIR hyperintensity involving the supratentorial and infratentorial cerebral white matter, consistent with known history of multiple sclerosis. Overall, appearance is relatively stable from previous, with no definite new lesions or evidence for significant disease progression. Single punctate nodular focus of enhancement about a juxta cortical lesion at the posterior parasagittal right frontal lobe consistent with active demyelination. 2. Mildly advanced cerebral atrophy for age, stable.   MRI CERVICAL SPINE IMPRESSION:   1. Extensive patchy and confluent signal abnormality throughout the cervical and visualized upper thoracic spinal cord, consistent with history of multiple sclerosis. No definite new lesions or evidence for significant disease progression. No active demyelination. 2. Small central disc protrusion at C5-6 without significant stenosis, stable.  MRI brain and cervical spine wwo contast 04/22/2021: 1. Unchanged distribution of white matter lesions of the brain and cervical spinal cord in a pattern consistent with multiple sclerosis. 2. No new or active demyelinating lesions. 3. Mildly accelerated volume loss for age, unchanged.   Labs 11/2021:  JCV negative     Thank you for allowing me to participate in patient's care.  If I can answer any additional questions, I would be pleased to do so.    Sincerely,    Hashim Eichhorst K. Posey Pronto, DO

## 2022-04-07 ENCOUNTER — Ambulatory Visit (INDEPENDENT_AMBULATORY_CARE_PROVIDER_SITE_OTHER): Payer: Managed Care, Other (non HMO)

## 2022-04-07 VITALS — BP 108/71 | HR 79 | Temp 98.1°F | Resp 16 | Ht 62.0 in | Wt 197.6 lb

## 2022-04-07 DIAGNOSIS — G35 Multiple sclerosis: Secondary | ICD-10-CM

## 2022-04-07 DIAGNOSIS — G35A Relapsing-remitting multiple sclerosis: Secondary | ICD-10-CM

## 2022-04-07 MED ORDER — SODIUM CHLORIDE 0.9 % IV SOLN: INTRAVENOUS | Status: DC

## 2022-04-07 MED ORDER — LORATADINE 10 MG PO TABS: 10.0000 mg | ORAL_TABLET | Freq: Once | ORAL | Status: DC

## 2022-04-07 MED ORDER — SODIUM CHLORIDE 0.9 % IV SOLN
300.0000 mg | Freq: Once | INTRAVENOUS | Status: AC
  Administered 2022-04-07: 300 mg via INTRAVENOUS
  Filled 2022-04-07: qty 15

## 2022-04-07 MED ORDER — ACETAMINOPHEN 325 MG PO TABS: 650.0000 mg | ORAL_TABLET | Freq: Once | ORAL | Status: DC

## 2022-04-07 NOTE — Progress Notes (Signed)
Diagnosis: Multiple Sclerosis  Provider:  Marshell Garfinkel MD  Procedure: Infusion  IV Type: Peripheral, IV Location: L Hand  Tysabri (Natalizumab), Dose: 300 mg  Infusion Start Time: 0923  Infusion Stop Time: 1030  Post Infusion IV Care: Peripheral IV Discontinued  Discharge: Condition: Good, Destination: Home . AVS Declined  Performed by:  Adelina Mings, LPN

## 2022-04-10 ENCOUNTER — Telehealth: Payer: Self-pay | Admitting: Neurology

## 2022-04-10 LAB — STRATIFY JCV AB (W/ INDEX) W/ RFLX
Index Value: 0.69 — ABNORMAL HIGH
Stratify JCV (TM) Ab w/Reflex Inhibition: POSITIVE — AB

## 2022-04-10 NOTE — Telephone Encounter (Signed)
Called and informed patient that JCV antibody is positive with titer of 0.69.  Briefly, I discussed that with titer < 1.5, we can stay on Tysabri with closer monitor (JCV q3 months, MRI q6 months) or switch to Orevus.  She is schedule for MRI brain on 4/25 and follow-up with me on 4/30 at 7:50a to discuss options.

## 2022-04-10 NOTE — Telephone Encounter (Signed)
Called patient and informed her of Dr. Eliane Decree advice below. Patient verbalized understanding and will reschedule her Infusion. Patient had no further questions or concerns.

## 2022-04-10 NOTE — Telephone Encounter (Signed)
Recommend that she call and reschedule until later that week or the next week, since she has an appointment with me at 7:50a that morning.  She probably would not make it in time.  We can decide about Tysabri at our next visit.

## 2022-04-10 NOTE — Telephone Encounter (Signed)
Pt called in stating she has an infusion scheduled on 05/05/22 at 8:30. She was not sure if she should still have that infusion?

## 2022-04-30 ENCOUNTER — Ambulatory Visit
Admission: RE | Admit: 2022-04-30 | Discharge: 2022-04-30 | Disposition: A | Discharge status: Home / Self Care | Payer: Managed Care, Other (non HMO) | Source: Ambulatory Visit | Attending: Neurology | Admitting: Neurology

## 2022-04-30 DIAGNOSIS — G35 Multiple sclerosis: Secondary | ICD-10-CM

## 2022-04-30 MED ORDER — GADOPICLENOL 0.5 MMOL/ML IV SOLN
8.0000 mL | Freq: Once | INTRAVENOUS | Status: AC | PRN
  Administered 2022-04-30: 8 mL via INTRAVENOUS

## 2022-05-05 ENCOUNTER — Ambulatory Visit (INDEPENDENT_AMBULATORY_CARE_PROVIDER_SITE_OTHER): Payer: Managed Care, Other (non HMO)

## 2022-05-05 ENCOUNTER — Ambulatory Visit: Payer: Managed Care, Other (non HMO)

## 2022-05-05 ENCOUNTER — Ambulatory Visit: Payer: Managed Care, Other (non HMO) | Admitting: Neurology

## 2022-05-05 VITALS — BP 100/68 | HR 78 | Temp 98.4°F | Resp 18 | Ht 62.0 in | Wt 194.8 lb

## 2022-05-05 DIAGNOSIS — G35 Multiple sclerosis: Secondary | ICD-10-CM

## 2022-05-05 DIAGNOSIS — G35A Relapsing-remitting multiple sclerosis: Secondary | ICD-10-CM

## 2022-05-05 MED ORDER — LORATADINE 10 MG PO TABS
10.0000 mg | ORAL_TABLET | Freq: Once | ORAL | Status: AC
  Administered 2022-05-05: 10 mg via ORAL
  Filled 2022-05-05: qty 1

## 2022-05-05 MED ORDER — SODIUM CHLORIDE 0.9 % IV SOLN
300.0000 mg | Freq: Once | INTRAVENOUS | Status: AC
  Administered 2022-05-05: 300 mg via INTRAVENOUS
  Filled 2022-05-05: qty 15

## 2022-05-05 MED ORDER — ACETAMINOPHEN 325 MG PO TABS
650.0000 mg | ORAL_TABLET | Freq: Once | ORAL | Status: AC
  Administered 2022-05-05: 650 mg via ORAL
  Filled 2022-05-05: qty 2

## 2022-05-05 MED ORDER — SODIUM CHLORIDE 0.9 % IV SOLN: INTRAVENOUS | Status: DC

## 2022-05-05 NOTE — Progress Notes (Signed)
Diagnosis: Multiple Sclerosis  Provider:  Chilton Greathouse MD  Procedure: IV Infusion  IV Type: Peripheral, IV Location: L Hand  Tysabri (Natalizumab), Dose: 300 mg  Infusion Start Time: 0911  Infusion Stop Time: 1020  Post Infusion IV Care: Peripheral IV Discontinued  Discharge: Condition: Good, Destination: Home . AVS Declined  Performed by:  Garnette Czech, RN

## 2022-05-12 ENCOUNTER — Ambulatory Visit: Payer: Managed Care, Other (non HMO)

## 2022-05-13 ENCOUNTER — Other Ambulatory Visit: Payer: Self-pay | Admitting: Pharmacy Technician

## 2022-05-13 ENCOUNTER — Encounter: Payer: Self-pay | Admitting: Neurology

## 2022-05-13 ENCOUNTER — Other Ambulatory Visit (INDEPENDENT_AMBULATORY_CARE_PROVIDER_SITE_OTHER): Payer: Managed Care, Other (non HMO)

## 2022-05-13 ENCOUNTER — Ambulatory Visit: Payer: Managed Care, Other (non HMO) | Admitting: Neurology

## 2022-05-13 VITALS — BP 118/72 | HR 84 | Ht 62.0 in | Wt 197.0 lb

## 2022-05-13 DIAGNOSIS — G35 Multiple sclerosis: Secondary | ICD-10-CM

## 2022-05-13 LAB — VITAMIN D 25 HYDROXY (VIT D DEFICIENCY, FRACTURES): VITD: 64.69 ng/mL (ref 30.00–100.00)

## 2022-05-13 NOTE — Patient Instructions (Signed)
Check labs  We will start prior authorization for Ocrevus  I will see you back in 3 months

## 2022-05-13 NOTE — Progress Notes (Signed)
Follow-up Visit   Date: 05/13/22    Peggy Banks MRN: [id number] DOB: [birth year]   Interim History: Peggy Banks is a 42 y.o. right-handed African American female with hypertension returning to the clinic for follow-up of multiple sclerosis.  The patient was accompanied to the clinic by self.    IMPRESSION: Relapsing remitting multiple sclerosis (diagnosed 2018, symptom onset 2005).   Disease burden involves juxtacortical, periventricular, brainstem, cerebellum, and nearly every level of the cervical and thoracic spine.  Tysabri was started in April 2018 and she has been doing great, however, her JCV recently became positive (titer 0.69), so we have mutually decided to switch therapy to Ocrevus.  Risks and benefits discussed.  MRI brain and cervical spine wwo contrast is stable disease in the brain and cervical spine, no progression or new lesions.   Overall, she is asymptomatic except mild right knee hyperextension and spasticity, which has improved on muscle relaxants.  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    PLAN: 1.  Due to JCV positivity, we will transition off Tysabri to Ocrevus.  2.  Check CBC diff, hepatitis panel, IgG, vitamin D 3.  Check CBC with diff, CMP, and immunoglobulin levels every 6 months 4.  Continue vitamin D 5000 units 5.  Continue baclofen 10mg  three times daily  Return to clinic in 3 months  -------------------------------------------------  UPDATE 05/13/2022:  She is here for follow-up visit.  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  She is asymptomatic and recent MRI brain and cervical spine shows stable disease, no progression.  Today, she is here to discuss alternative immunotherapy options and review results.  She denies any new symptoms.   Medications:   Current Outpatient Medications on File Prior to Visit  Medication Sig Dispense Refill   acetaminophen (TYLENOL) 500 MG tablet Take 1,000 mg  by mouth every 6 (six) hours as needed for mild pain or headache (PRN infusion). PRE MED FOR MS INFUSION     baclofen (LIORESAL) 10 MG tablet TAKE 1 TABLET BY MOUTH 3 TIMES  DAILY 270 tablet 3   Cholecalciferol (VITAMIN D PO) Take 5,000 Units by mouth.     ibuprofen (ADVIL) 800 MG tablet Take 1 tablet (800 mg total) by mouth every 8 (eight) hours as needed for moderate pain. 30 tablet 1   lisinopril-hydrochlorothiazide (PRINZIDE,ZESTORETIC) 10-12.5 MG tablet Take 1 tablet by mouth daily.     loratadine (CLARITIN) 10 MG tablet Take 10 mg by mouth daily.     metFORMIN (GLUCOPHAGE) 500 MG tablet Take by mouth every evening.     Multiple Vitamin (MULTIVITAMIN) tablet Take 1 tablet by mouth daily.     Natalizumab (TYSABRI IV) Inject into the vein every 28 (twenty-eight) days.     Omega-3 Fatty Acids (FISH OIL) 1360 MG CAPS Take 2 capsules by mouth daily.     rosuvastatin (CRESTOR) 10 MG tablet Take 10 mg by mouth at bedtime.     Semaglutide,0.25 or 0.5MG /DOS, (OZEMPIC, 0.25 OR 0.5 MG/DOSE,) 2 MG/1.5ML SOPN Inject 0.5 mg into the skin once a week.     No current facility-administered medications on file prior to visit.    Allergies:  Allergies  Allergen Reactions   Cefdinir Swelling    Vital Signs:  BP 118/72   Pulse 84   Ht 5\' 2"  (1.575 m)   Wt 197 lb (89.4 kg)   SpO2 98%   BMI 36.03 kg/m   Neurological Exam: MENTAL  STATUS including orientation to time, place, person, recent and remote memory, attention span and concentration, language, and fund of knowledge is normal.  Speech is not dysarthric.  CRANIAL NERVES:   Pupils round and reactive to light.  Extraocular muscles intact.  Face is symmetric. Tongue is midline.    MOTOR:  Motor strength is 5/5 in all extremities, except right foot dorsiflexion 5-/5, eversion 5-/5, inversion 5-/5.  There mild RLE spasticity.  MSRs:  Right                                                                 Left brachioradialis 3+  brachioradialis 2+   biceps 3+  biceps 2+  triceps 3+  triceps 2+  patellar 3+  patellar 3+  ankle jerk 3+  ankle jerk 2+   SENSORY: Vibration is intact at the knees and MCP bilaterally  COORDINATION/GAIT:  Gait is stable, minimal, if any, right knee hyperextension and trace right foot inversion.  Data: MRI brain and cervical spine wwo contrast 04/20/2020:   1. Extensive T2/FLAIR hyperintensity involving the supratentorial and infratentorial cerebral white matter, consistent with known history of multiple sclerosis. Overall, appearance is relatively stable from previous, with no definite new lesions or evidence for significant disease progression. Single punctate nodular focus of enhancement about a juxta cortical lesion at the posterior parasagittal right frontal lobe consistent with active demyelination. 2. Mildly advanced cerebral atrophy for age, stable.   MRI CERVICAL SPINE IMPRESSION:   1. Extensive patchy and confluent signal abnormality throughout the cervical and visualized upper thoracic spinal cord, consistent with history of multiple sclerosis. No definite new lesions or evidence for significant disease progression. No active demyelination. 2. Small central disc protrusion at C5-6 without significant stenosis, stable.  MRI brain and cervical spine wwo contast 04/22/2021: 1. Unchanged distribution of white matter lesions of the brain and cervical spinal cord in a pattern consistent with multiple sclerosis. 2. No new or active demyelinating lesions. 3. Mildly accelerated volume loss for age, unchanged.   MRI brain and cervical spine wwo contrast 04/30/2022: 1. Similar sequela of chronic demyelination intracranially and in the cervical cord. No new lesions identified and no evidence of active demyelination. 2. At C5-C6, similar central disc protrusion contacts and deforms the cord.  Labs 04/06/2022:  JCV positive 0.69*  Total time spent reviewing records, interview, history/exam, documentation, and  coordination of care on day of encounter:  40 min    Thank you for allowing me to participate in patient's care.  If I can answer any additional questions, I would be pleased to do so.    Sincerely,    Cadell Gabrielson K. Allena Katz, DO

## 2022-05-14 LAB — CBC WITH DIFFERENTIAL
Basophils Absolute: 0 10*3/uL (ref 0.0–0.2)
Basos: 0 %
EOS (ABSOLUTE): 0 10*3/uL (ref 0.0–0.4)
Eos: 1 %
Hematocrit: 33.6 % — ABNORMAL LOW (ref 34.0–46.6)
Hemoglobin: 10.9 g/dL — ABNORMAL LOW (ref 11.1–15.9)
Immature Grans (Abs): 0 10*3/uL (ref 0.0–0.1)
Immature Granulocytes: 1 %
Lymphocytes Absolute: 2.9 10*3/uL (ref 0.7–3.1)
Lymphs: 46 %
MCH: 27.2 pg (ref 26.6–33.0)
MCHC: 32.4 g/dL (ref 31.5–35.7)
MCV: 84 fL (ref 79–97)
Monocytes Absolute: 0.5 10*3/uL (ref 0.1–0.9)
Monocytes: 8 %
Neutrophils Absolute: 2.7 10*3/uL (ref 1.4–7.0)
Neutrophils: 44 %
RBC: 4.01 x10E6/uL (ref 3.77–5.28)
RDW: 14.4 % (ref 11.7–15.4)
WBC: 6.2 10*3/uL (ref 3.4–10.8)

## 2022-05-14 LAB — IGG, IGA, IGM
IgG (Immunoglobin G), Serum: 1261 mg/dL (ref 600–1640)
IgM, Serum: 91 mg/dL (ref 50–300)

## 2022-05-14 LAB — ACUTE HEP PANEL AND HEP B SURFACE AB: Hep B C IgM: NONREACTIVE

## 2022-05-15 LAB — ACUTE HEP PANEL AND HEP B SURFACE AB
HEPATITIS C ANTIBODY REFILL$(REFL): NONREACTIVE
Hep A IgM: NONREACTIVE
Hepatitis B Surface Ag: NONREACTIVE

## 2022-05-15 LAB — IGG, IGA, IGM: Immunoglobulin A: 386 mg/dL — ABNORMAL HIGH (ref 47–310)

## 2022-05-15 LAB — REFLEX TIQ

## 2022-05-20 ENCOUNTER — Telehealth: Payer: Self-pay | Admitting: Pharmacy Technician

## 2022-05-20 NOTE — Telephone Encounter (Signed)
error 

## 2022-05-20 NOTE — Telephone Encounter (Addendum)
Dr. Tyrone Apple,  [place] note:  Auth Submission: APPROVED Site of care: Site of care: [facility] Payer: [employer] - ID: [id number] Medication & CPT/J Code(s) submitted: Emogene Morgan [facility] [employer]) [phone] Route of submission (phone, fax, portal):  Phone #[phone] Fax #[phone] Auth type: PHARMACY. PHONE: [phone] Rep: April Units/visits requested: 2 doses Reference number: [id number] AUTH: [id number] Approval from: 05/20/22 to 07/14/22  [facility]   Co-pay card: Approved Id: [id number] GR: [id number] BIN: [id number] PCN: [id number] PAYER ID: [id number]  (Deductible and OOP have been met)

## 2022-05-26 ENCOUNTER — Telehealth: Payer: Self-pay | Admitting: Pharmacy Technician

## 2022-05-26 NOTE — Telephone Encounter (Signed)
error 

## 2022-05-27 MED ORDER — OCREVUS 300 MG/10ML IV SOLN: 300.0000 mg | INTRAVENOUS | 0 refills | Status: DC

## 2022-05-27 MED ORDER — OCRELIZUMAB 300 MG/10ML IV SOLN: 600.0000 mg | INTRAVENOUS | 1 refills | Status: AC

## 2022-05-27 NOTE — Telephone Encounter (Signed)
Dr. Allena Katz, [facility] Fare

## 2022-05-27 NOTE — Addendum Note (Signed)
Addended by: Glendale Chard on: 05/27/2022 11:45 AM   Modules accepted: Orders

## 2022-05-27 NOTE — Telephone Encounter (Addendum)
Mahina, Ocrevus f/u:  Please send script to [facility] and I will schedule delivery of her medication. Her appt is scheduled for 06/09/22. Please send script as soon as possible to allow for delivery time of medication.  Thanks Earnestine Mealing: [id number] 05/20/22 - 05/20/23

## 2022-06-02 ENCOUNTER — Ambulatory Visit: Payer: Managed Care, Other (non HMO)

## 2022-06-09 ENCOUNTER — Ambulatory Visit (INDEPENDENT_AMBULATORY_CARE_PROVIDER_SITE_OTHER): Payer: Managed Care, Other (non HMO)

## 2022-06-09 VITALS — BP 113/75 | HR 85 | Temp 98.1°F | Resp 18 | Ht 62.0 in | Wt 192.6 lb

## 2022-06-09 DIAGNOSIS — G35 Multiple sclerosis: Secondary | ICD-10-CM | POA: Diagnosis not present

## 2022-06-09 MED ORDER — SODIUM CHLORIDE 0.9 % IV SOLN
300.0000 mg | Freq: Once | INTRAVENOUS | Status: AC
  Administered 2022-06-09: 300 mg via INTRAVENOUS
  Filled 2022-06-09: qty 10

## 2022-06-09 MED ORDER — METHYLPREDNISOLONE SODIUM SUCC 125 MG IJ SOLR
125.0000 mg | Freq: Once | INTRAMUSCULAR | Status: AC
  Administered 2022-06-09: 125 mg via INTRAVENOUS
  Filled 2022-06-09: qty 2

## 2022-06-09 MED ORDER — DIPHENHYDRAMINE HCL 25 MG PO CAPS
50.0000 mg | ORAL_CAPSULE | Freq: Once | ORAL | Status: AC
  Administered 2022-06-09: 50 mg via ORAL
  Filled 2022-06-09: qty 2

## 2022-06-09 MED ORDER — ACETAMINOPHEN 325 MG PO TABS
650.0000 mg | ORAL_TABLET | Freq: Once | ORAL | Status: AC
  Administered 2022-06-09: 650 mg via ORAL
  Filled 2022-06-09: qty 2

## 2022-06-09 NOTE — Patient Instructions (Signed)
Ocrelizumab Injection What is this medication? OCRELIZUMAB (ok re LIZ ue mab) treats multiple sclerosis (MS). It works by slowing down an overactive immune system, which prevents or delays worsening symptoms. It also decreases the number of flare-ups. It is not a cure for MS. It belongs to a group of medications called monoclonal antibodies. This medicine may be used for other purposes; ask your health care provider or pharmacist if you have questions. COMMON BRAND NAME(S): OCREVUS What should I tell my care team before I take this medication? They need to know if you have any of these conditions: Cancer Hepatitis B infection Infection especially a viral infection, such as chickenpox, cold sores, herpes An unusual or allergic reaction to ocrelizumab, other medications, foods, dyes or preservatives Pregnant or trying to get pregnant Breast-feeding How should I use this medication? This medicine is for injection into a vein. It is given by your care team in a hospital or clinic setting. A special MedGuide will be given to you before each treatment. Be sure to read this information carefully each time. Talk to your care team about the use of this medication in children. Special care may be needed. Overdosage: If you think you have taken too much of this medicine contact a poison control center or emergency room at once. NOTE: This medicine is only for you. Do not share this medicine with others. What if I miss a dose? Keep appointments for follow-up doses. It is important not to miss your dose. Call your care team if you are unable to keep an appointment. What may interact with this medication? Alemtuzumab Anifrolumab Belimumab Daclizumab Dimethyl fumarate Diroximel fumarate Fingolimod Glatiramer Interferon beta Live virus vaccines Mitoxantrone Monomethyl fumarate Natalizumab Ofatumumab Ozanimod Peginterferon beta Ponesimod Rituximab Siponimod Steroid medications like prednisone  or cortisone Teriflunomide This list may not describe all possible interactions. Give your health care provider a list of all the medicines, herbs, non-prescription drugs, or dietary supplements you use. Also tell them if you smoke, drink alcohol, or use illegal drugs. Some items may interact with your medicine. What should I watch for while using this medication? Your condition will be monitored carefully while you are receiving this medication. This medication can cause serious allergic reactions. To reduce the risk, your care team may give you other medications to take before receiving this one. Be sure to follow the directions from your care team. Talk to your care team if you wish to become pregnant or think you might be pregnant. This medication can cause serious birth defects. Effective contraception is recommended during and for 6 months after stopping treatment. This medication may increase your risk of getting an infection. Call your care team for advice if you get a fever, chills, sore throat, or other symptoms of a cold or flu. Do not treat yourself. Try to avoid being around people who are sick. If you have hepatitis B, talk to your care team if you plan to stop this medication. The symptoms of hepatitis B may get worse if you stop this medication. In some patients, this medication may cause a serious brain infection that may cause death. If you have any problems seeing, thinking, speaking, walking, or standing, tell your care team right away. If you cannot reach your care team, urgently seek other source of medical care. This medication can decrease the response to a vaccine. If you need to get vaccinated, tell your care team if you have received this medication. Extra booster doses may be needed. Talk to  your care team to see if a different vaccination schedule is needed. Talk to your care team about your risk of cancer. You may be more at risk for certain types of cancer if you take this  medication. What side effects may I notice from receiving this medication? Side effects that you should report to your care team as soon as possible: Allergic reactions--skin rash, itching, hives, swelling of the face, lips, tongue, or throat Breast tissue changes, new lumps, redness, pain, or discharge from the nipple Dizziness, loss of balance or coordination, confusion or trouble speaking Infection--fever, chills, cough, sore throat, wounds that don't heal, pain or trouble when passing urine, general feeling of discomfort or being unwell Infusion reactions--chest pain, shortness of breath or trouble breathing, feeling faint or lightheaded Liver injury--right upper belly pain, loss of appetite, nausea, light-colored stool, dark yellow or brown urine, yellowing skin or eyes, unusual weakness or fatigue Sudden or severe stomach pain, bloody diarrhea, fever, nausea, vomiting Side effects that usually do not require medical attention (report to your care team if they continue or are bothersome): Back pain Diarrhea Swelling of the ankles, hands, or feet Worsening mood, feelings of depression This list may not describe all possible side effects. Call your doctor for medical advice about side effects. You may report side effects to FDA at 1-800-FDA-1088. Where should I keep my medication? This medication is given in a hospital or clinic. It will not be stored at home. NOTE: This sheet is a summary. It may not cover all possible information. If you have questions about this medicine, talk to your doctor, pharmacist, or health care provider.  2024 Elsevier/Gold Standard (2020-09-23 00:00:00)

## 2022-06-09 NOTE — Progress Notes (Signed)
Diagnosis: Multiple Sclerosis  Provider:  Chilton Greathouse MD  Procedure: IV Infusion  IV Type: Peripheral, IV Location: L Antecubital  Ocrevus (Ocrelizumab), Dose: 300 mg  Infusion Start Time: 0925  Infusion Stop Time: 1228  Post Infusion IV Care: Observation period completed and Peripheral IV Discontinued  Discharge: Condition: Good, Destination: Home . AVS Declined  Performed by:  Adriana Mccallum, RN

## 2022-06-23 ENCOUNTER — Ambulatory Visit (INDEPENDENT_AMBULATORY_CARE_PROVIDER_SITE_OTHER): Payer: Managed Care, Other (non HMO)

## 2022-06-23 VITALS — BP 104/68 | HR 83 | Temp 98.3°F | Resp 16 | Ht 62.0 in | Wt 189.0 lb

## 2022-06-23 DIAGNOSIS — G35 Multiple sclerosis: Secondary | ICD-10-CM

## 2022-06-23 MED ORDER — METHYLPREDNISOLONE SODIUM SUCC 125 MG IJ SOLR
125.0000 mg | Freq: Once | INTRAMUSCULAR | Status: AC
  Administered 2022-06-23: 125 mg via INTRAVENOUS
  Filled 2022-06-23: qty 2

## 2022-06-23 MED ORDER — SODIUM CHLORIDE 0.9 % IV SOLN
300.0000 mg | Freq: Once | INTRAVENOUS | Status: AC
  Administered 2022-06-23: 300 mg via INTRAVENOUS
  Filled 2022-06-23: qty 10

## 2022-06-23 MED ORDER — ACETAMINOPHEN 325 MG PO TABS
650.0000 mg | ORAL_TABLET | Freq: Once | ORAL | Status: AC
  Administered 2022-06-23: 650 mg via ORAL
  Filled 2022-06-23: qty 2

## 2022-06-23 MED ORDER — DIPHENHYDRAMINE HCL 25 MG PO CAPS
50.0000 mg | ORAL_CAPSULE | Freq: Once | ORAL | Status: AC
  Administered 2022-06-23: 50 mg via ORAL
  Filled 2022-06-23: qty 2

## 2022-06-23 NOTE — Progress Notes (Signed)
Diagnosis: Multiple Sclerosis  Provider:  Chilton Greathouse MD  Procedure: IV Infusion  IV Type: Peripheral, IV Location: L Antecubital  Ocrevus (Ocrelizumab), Dose: 300 mg  Infusion Start Time: 0939  Infusion Stop Time: 1242  Post Infusion IV Care: Observation period completed and Peripheral IV Discontinued  Discharge: Condition: Good, Destination: Home . AVS Declined  Performed by:  Nat Math, RN

## 2022-08-18 ENCOUNTER — Encounter: Payer: Self-pay | Admitting: Neurology

## 2022-08-18 ENCOUNTER — Ambulatory Visit (INDEPENDENT_AMBULATORY_CARE_PROVIDER_SITE_OTHER): Payer: Managed Care, Other (non HMO) | Admitting: Neurology

## 2022-08-18 VITALS — BP 109/74 | HR 79 | Ht 62.0 in | Wt 188.0 lb

## 2022-08-18 DIAGNOSIS — G35 Multiple sclerosis: Secondary | ICD-10-CM

## 2022-08-18 NOTE — Patient Instructions (Addendum)
Continue Ocrevus (next due in December)  I will see you back in 3 months

## 2022-08-18 NOTE — Progress Notes (Signed)
Follow-up Visit   Date: 08/18/22    Ward Peggy Banks MRN: [id number] DOB: 1980/03/29   Interim History: Peggy Banks is a 42 y.o. right-handed African American female with hypertension returning to the clinic for follow-up of multiple sclerosis.  The patient was accompanied to the clinic by self.    IMPRESSION: Relapsing remitting multiple sclerosis (diagnosed 2018, symptom onset 2005).   Disease burden involves juxtacortical, periventricular, brainstem, cerebellum, and nearly every level of the cervical and thoracic spine.  Peggy Banks was started in April 2018 and she has been doing great, however, her JCV recently became positive (titer 0.69), so we have mutually decided to switch therapy to Ocrevus (1st dose in June 2024), which she has been tolerating well.  MRI brain and cervical spine wwo contrast is stable disease in the brain and cervical spine, no progression or new lesions.   Overall, she is asymptomatic except mild right knee hyperextension and spasticity, which has improved on muscle relaxants.  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   She mentioned increased fatigue and discussed medication such as modafinil.  She will think about it.   PLAN: 1.  Continue Ocrevus every 6 months (next due in December) 2.  Check CBC with diff, CMP, and immunoglobulin levels every 6 months 3.  Continue vitamin D 5000 units 4.  Continue baclofen 10mg  three times daily 5.  If daytime fatigue gets worse, consider modafinil going forward  Return to clinic in 3 months  -------------------------------------------------  UPDATE 05/13/2022:  She is here for follow-up visit.  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  She is asymptomatic and recent MRI brain and cervical spine shows stable disease, no progression.  Today, she is here to discuss alternative immunotherapy options and review results.  She denies any new symptoms.  UPDATE 08/18/2022:  She  is here for follow-up visit.  She received her initial two doses of Ocrevus in June and tolerated it well.  She denies any new neurological symptoms.  She feels that her walking is slightly better and she does not hyperextend the knee as much. She does report increased fatigue lately, but feels some of this may be secondary to work-related stress.  She has contemplated looking for alternative options.    Medications:   Current Outpatient Medications on File Prior to Visit  Medication Sig Dispense Refill   acetaminophen (TYLENOL) 500 MG tablet Take 1,000 mg by mouth every 6 (six) hours as needed for mild pain or headache (PRN infusion). PRE MED FOR MS INFUSION     baclofen (LIORESAL) 10 MG tablet TAKE 1 TABLET BY MOUTH 3 TIMES  DAILY 270 tablet 3   Cholecalciferol (VITAMIN D PO) Take 5,000 Units by mouth.     ibuprofen (ADVIL) 800 MG tablet Take 1 tablet (800 mg total) by mouth every 8 (eight) hours as needed for moderate pain. 30 tablet 1   lisinopril-hydrochlorothiazide (PRINZIDE,ZESTORETIC) 10-12.5 MG tablet Take 1 tablet by mouth daily.     loratadine (CLARITIN) 10 MG tablet Take 10 mg by mouth daily.     metFORMIN (GLUCOPHAGE) 500 MG tablet Take by mouth every evening.     Multiple Vitamin (MULTIVITAMIN) tablet Take 1 tablet by mouth daily.     ocrelizumab (OCREVUS) 300 MG/10ML injection Inject 10 mLs (300 mg total) into the vein every 14 (fourteen) days. 20 mL 0   ocrelizumab (OCREVUS) 300 MG/10ML injection Inject 20 mLs (600 mg total) into the vein every  6 (six) months. 20 mL 1   Omega-3 Fatty Acids (FISH OIL) 1360 MG CAPS Take 2 capsules by mouth daily.     rosuvastatin (CRESTOR) 10 MG tablet Take 10 mg by mouth at bedtime.     Semaglutide,0.25 or 0.5MG /DOS, (OZEMPIC, 0.25 OR 0.5 MG/DOSE,) 2 MG/1.5ML SOPN Inject 0.5 mg into the skin once a week.     Natalizumab (TYSABRI IV) Inject into the vein every 28 (twenty-eight) days. (Patient not taking: Reported on 08/18/2022)     No current  facility-administered medications on file prior to visit.    Allergies:  Allergies  Allergen Reactions   Cefdinir Swelling    Vital Signs:  BP 109/74   Pulse 79   Ht 5\' 2"  (1.575 m)   Wt 188 lb (85.3 kg)   SpO2 100%   BMI 34.39 kg/m   Neurological Exam: MENTAL STATUS including orientation to time, place, person, recent and remote memory, attention span and concentration, language, and fund of knowledge is normal.  Speech is not dysarthric.  CRANIAL NERVES:   Pupils round and reactive to light.  Extraocular muscles intact.  Face is symmetric. Tongue is midline.    MOTOR:  Motor strength is 5/5 in all extremities, except right foot dorsiflexion 5-/5, eversion 5-/5, inversion 5-/5.  There mild RLE spasticity.  MSRs:  Right                                                    Left brachioradialis 3+  brachioradialis 2+  biceps 3+  biceps 2+  triceps 3+  triceps 2+  patellar 3+  patellar 3+  ankle jerk 3+  ankle jerk 2+   SENSORY: Vibration is intact at the knees and MCP bilaterally  COORDINATION/GAIT:  Gait is stable, minimal, if any, right knee hyperextension and trace right foot inversion.  Data: MRI brain and cervical spine wwo contrast 04/20/2020:   1. Extensive T2/FLAIR hyperintensity involving the supratentorial and infratentorial cerebral white matter, consistent with known history of multiple sclerosis. Overall, appearance is relatively stable from previous, with no definite new lesions or evidence for significant disease progression. Single punctate nodular focus of enhancement about a juxta cortical lesion at the posterior parasagittal right frontal lobe consistent with active demyelination. 2. Mildly advanced cerebral atrophy for age, stable.   MRI CERVICAL SPINE IMPRESSION:   1. Extensive patchy and confluent signal abnormality throughout the cervical and visualized upper thoracic spinal cord, consistent with history of multiple sclerosis. No definite new lesions or  evidence for significant disease progression. No active demyelination. 2. Small central disc protrusion at C5-6 without significant stenosis, stable.  MRI brain and cervical spine wwo contast 04/22/2021: 1. Unchanged distribution of white matter lesions of the brain and cervical spinal cord in a pattern consistent with multiple sclerosis. 2. No new or active demyelinating lesions. 3. Mildly accelerated volume loss for age, unchanged.   MRI brain and cervical spine wwo contrast 04/30/2022: 1. Similar sequela of chronic demyelination intracranially and in the cervical cord. No new lesions identified and no evidence of active demyelination. 2. At C5-C6, similar central disc protrusion contacts and deforms the cord.  Labs 04/06/2022:  JCV positive 0.69*  Total time spent reviewing records, interview, history/exam, documentation, and coordination of care on day of encounter:  20 min     Thank you for allowing me  to participate in patient's care.  If I can answer any additional questions, I would be pleased to do so.    Sincerely,     K. Allena Katz, DO

## 2022-10-21 ENCOUNTER — Other Ambulatory Visit: Payer: Self-pay | Admitting: Internal Medicine

## 2022-10-21 DIAGNOSIS — Z0000 Encounter for general adult medical examination without abnormal findings: Secondary | ICD-10-CM

## 2022-11-09 ENCOUNTER — Other Ambulatory Visit: Payer: Self-pay | Admitting: Neurology

## 2022-11-23 ENCOUNTER — Ambulatory Visit
Admission: RE | Admit: 2022-11-23 | Discharge: 2022-11-23 | Disposition: A | Discharge status: Home / Self Care | Payer: Managed Care, Other (non HMO) | Source: Ambulatory Visit | Attending: Internal Medicine | Admitting: Internal Medicine

## 2022-11-23 ENCOUNTER — Telehealth: Payer: Self-pay | Admitting: Pharmacy Technician

## 2022-11-23 DIAGNOSIS — Z0000 Encounter for general adult medical examination without abnormal findings: Secondary | ICD-10-CM

## 2022-11-23 NOTE — Telephone Encounter (Addendum)
Auth Submission:  APPROVED RENEWAL -NEW TX PLAN WILL NEED TO BE ENTERED.  Site of care: Site of care: [facility] Payer: [employer] Medication & CPT/J Code(s) submitted: Ocrevus [facility]) [phone] Route of submission (phone, fax, portal): [phone] Phone # Fax # [phone] Auth type: PHARMACY BENEFIT [facility] Units/visits requested: 600MG  Q6 MONTHS Reference number: [id number] Approval from:  11/23/22 - 11/22/23

## 2022-11-25 ENCOUNTER — Telehealth: Payer: Self-pay | Admitting: Pharmacy Technician

## 2022-11-25 NOTE — Telephone Encounter (Signed)
Dr. Allena Katz,  I have scanned the Ocrevus script to her media tab.  Please complete/sign the script request and fax to [facility]. Her next scheduled appt is 12/25/22.  Thanks Selena Batten

## 2022-11-26 NOTE — Telephone Encounter (Signed)
Ppw has been faxed

## 2022-11-26 NOTE — Telephone Encounter (Signed)
Completed.  Thank you! 

## 2022-11-30 ENCOUNTER — Encounter: Payer: Self-pay | Admitting: Neurology

## 2022-12-15 ENCOUNTER — Other Ambulatory Visit: Payer: Managed Care, Other (non HMO)

## 2022-12-15 ENCOUNTER — Encounter: Payer: Self-pay | Admitting: Neurology

## 2022-12-15 ENCOUNTER — Ambulatory Visit (INDEPENDENT_AMBULATORY_CARE_PROVIDER_SITE_OTHER): Payer: Managed Care, Other (non HMO) | Admitting: Neurology

## 2022-12-15 VITALS — BP 118/79 | HR 87 | Ht 62.0 in | Wt 177.0 lb

## 2022-12-15 DIAGNOSIS — G35 Multiple sclerosis: Secondary | ICD-10-CM

## 2022-12-15 NOTE — Patient Instructions (Addendum)
Labs today  Repeat MRI brain and cervical spine in April

## 2022-12-15 NOTE — Progress Notes (Signed)
Follow-up Visit   Date: 12/15/22    [name] MRN: [id number] DOB: 03-16-1980   Interim History: Peggy Banks is a 42 y.o. right-handed African American female with hypertension returning to the clinic for follow-up of multiple sclerosis.  The patient was accompanied to the clinic by self.    IMPRESSION: Relapsing remitting multiple sclerosis on immunotherapy (diagnosed 2018, symptom onset 2005).   Disease burden involves juxtacortical, periventricular, brainstem, cerebellum, and nearly every level of the cervical and thoracic spine.  Initially, she was on Tysabri (2018 - 2024), however, after JCV became positive, she was switched to Ocrevus (06/2022).  She has been tolerating this well.    Overall, she is asymptomatic except mild right knee hyperextension and spasticity, which has improved on muscle relaxants.  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   She reports left posterior knee pain, upon standing which seems musculoskeletal in nature. Neurological exam is stable.   PLAN: 1.  Continue Ocrevus every 6 months (due later this month) 2.  Check CBC with diff, CMP, and immunoglobulin levels every 6 months due to high risk medication  3.  Check MRI brain and cervical spine wwo contrast in April 4.  Continue vitamin D 5000 units 5.  Continue baclofen 10mg  TID 6.  For left knee pain, which may be due to tendonitis, recommend NSAIDs and ice.   Return to clinic in 6 months  -------------------------------------------------  UPDATE 05/13/2022:  She is here for follow-up visit.  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  She is asymptomatic and recent MRI brain and cervical spine shows stable disease, no progression.  Today, she is here to discuss alternative immunotherapy options and review results.  She denies any new symptoms.  UPDATE 08/18/2022:  She is here for follow-up visit.  She received her initial two doses of Ocrevus in  June and tolerated it well.  She denies any new neurological symptoms.  She feels that her walking is slightly better and she does not hyperextend the knee as much. She does report increased fatigue lately, but feels some of this may be secondary to work-related stress.  She has contemplated looking for alternative options.   UPDATE 12/15/2022:  She is here for follow-up visit.  She has been doing well. Over the past few weeks, she has developed left posterior knee pain, which occurs upon standing.  Once she is walking, pain is relieved.  Sometimes, she has similar pain in the right hip.  No new weakness, numbness/tingling, or vision changes.  She is tolerating Ocrevus well.    Medications:   Current Outpatient Medications on File Prior to Visit  Medication Sig Dispense Refill   acetaminophen (TYLENOL) 500 MG tablet Take 1,000 mg by mouth every 6 (six) hours as needed for mild pain or headache (PRN infusion). PRE MED FOR MS INFUSION     baclofen (LIORESAL) 10 MG tablet TAKE 1 TABLET BY MOUTH 3 TIMES  DAILY 270 tablet 3   Cholecalciferol (VITAMIN D PO) Take 5,000 Units by mouth.     ibuprofen (ADVIL) 800 MG tablet Take 1 tablet (800 mg total) by mouth every 8 (eight) hours as needed for moderate pain. 30 tablet 1   lisinopril-hydrochlorothiazide (PRINZIDE,ZESTORETIC) 10-12.5 MG tablet Take 1 tablet by mouth daily.     loratadine (CLARITIN) 10 MG tablet Take 10 mg by mouth daily.     Multiple Vitamin (MULTIVITAMIN) tablet Take 1 tablet by mouth daily.  ocrelizumab (OCREVUS) 300 MG/10ML injection Inject 20 mLs (600 mg total) into the vein every 6 (six) months. 20 mL 1   Omega-3 Fatty Acids (FISH OIL) 1360 MG CAPS Take 2 capsules by mouth daily.     rosuvastatin (CRESTOR) 10 MG tablet Take 10 mg by mouth at bedtime.     Semaglutide,0.25 or 0.5MG /DOS, (OZEMPIC, 0.25 OR 0.5 MG/DOSE,) 2 MG/1.5ML SOPN Inject 0.5 mg into the skin once a week.     No current facility-administered medications on file  prior to visit.    Allergies:  Allergies  Allergen Reactions   Cefdinir Swelling    Vital Signs:  BP 118/79   Pulse 87   Ht 5\' 2"  (1.575 m)   Wt 177 lb (80.3 kg)   SpO2 100%   BMI 32.37 kg/m   Neurological Exam: MENTAL STATUS including orientation to time, place, person, recent and remote memory, attention span and concentration, language, and fund of knowledge is normal.  Speech is not dysarthric.  CRANIAL NERVES:   Pupils round and reactive to light.  Extraocular muscles intact.  Face is symmetric. Tongue is midline.    MOTOR:  Motor strength is 5/5 in all extremities, except right foot dorsiflexion 5-/5, eversion 5-/5, inversion 5-/5.  There mild RLE spasticity.  MSRs:  Right                                                    Left brachioradialis 3+  brachioradialis 2+  biceps 3+  biceps 2+  triceps 3+  triceps 2+  patellar 3+  patellar 3+  ankle jerk 3+  ankle jerk 2+   SENSORY: Vibration is intact at the knees and MCP bilaterally  COORDINATION/GAIT:  Gait is stable, minimal, if any, right knee hyperextension and trace right foot inversion.  Data: MRI brain and cervical spine wwo contrast 04/20/2020:   1. Extensive T2/FLAIR hyperintensity involving the supratentorial and infratentorial cerebral white matter, consistent with known history of multiple sclerosis. Overall, appearance is relatively stable from previous, with no definite new lesions or evidence for significant disease progression. Single punctate nodular focus of enhancement about a juxta cortical lesion at the posterior parasagittal right frontal lobe consistent with active demyelination. 2. Mildly advanced cerebral atrophy for age, stable.   MRI CERVICAL SPINE IMPRESSION:   1. Extensive patchy and confluent signal abnormality throughout the cervical and visualized upper thoracic spinal cord, consistent with history of multiple sclerosis. No definite new lesions or evidence for significant disease  progression. No active demyelination. 2. Small central disc protrusion at C5-6 without significant stenosis, stable.  MRI brain and cervical spine wwo contast 04/22/2021: 1. Unchanged distribution of white matter lesions of the brain and cervical spinal cord in a pattern consistent with multiple sclerosis. 2. No new or active demyelinating lesions. 3. Mildly accelerated volume loss for age, unchanged.   MRI brain and cervical spine wwo contrast 04/30/2022: 1. Similar sequela of chronic demyelination intracranially and in the cervical cord. No new lesions identified and no evidence of active demyelination. 2. At C5-C6, similar central disc protrusion contacts and deforms the cord.  Labs 04/06/2022:  JCV positive 0.69*  Total time spent reviewing records, interview, history/exam, documentation, and coordination of care on day of encounter:  20 min     Thank you for allowing me to participate in patient's care.  If I can answer any additional questions, I would be pleased to do so.    Sincerely,    Sahra Converse K. Allena Katz, DO

## 2022-12-16 LAB — CBC WITH DIFFERENTIAL/PLATELET
Absolute Lymphocytes: 1323 {cells}/uL (ref 850–3900)
Absolute Monocytes: 252 {cells}/uL (ref 200–950)
Basophils Absolute: 10 {cells}/uL (ref 0–200)
Basophils Relative: 0.3 %
Eosinophils Absolute: 20 {cells}/uL (ref 15–500)
Eosinophils Relative: 0.6 %
HCT: 34.2 % — ABNORMAL LOW (ref 35.0–45.0)
Hemoglobin: 10.9 g/dL — ABNORMAL LOW (ref 11.7–15.5)
MCH: 27.2 pg (ref 27.0–33.0)
MCHC: 31.9 g/dL — ABNORMAL LOW (ref 32.0–36.0)
MCV: 85.3 fL (ref 80.0–100.0)
MPV: 9 fL (ref 7.5–12.5)
Monocytes Relative: 7.4 %
Neutro Abs: 1795 {cells}/uL (ref 1500–7800)
Neutrophils Relative %: 52.8 %
Platelets: 344 10*3/uL (ref 140–400)
RBC: 4.01 10*6/uL (ref 3.80–5.10)
RDW: 12.5 % (ref 11.0–15.0)
Total Lymphocyte: 38.9 %
WBC: 3.4 10*3/uL — ABNORMAL LOW (ref 3.8–10.8)

## 2022-12-16 LAB — COMPREHENSIVE METABOLIC PANEL
AG Ratio: 1.4 (calc) (ref 1.0–2.5)
ALT: 11 U/L (ref 6–29)
AST: 12 U/L (ref 10–30)
Albumin: 4.1 g/dL (ref 3.6–5.1)
Alkaline phosphatase (APISO): 35 U/L (ref 31–125)
BUN: 14 mg/dL (ref 7–25)
CO2: 29 mmol/L (ref 20–32)
Calcium: 9.3 mg/dL (ref 8.6–10.2)
Chloride: 102 mmol/L (ref 98–110)
Creat: 0.57 mg/dL (ref 0.50–0.99)
Globulin: 3 g/dL (ref 1.9–3.7)
Glucose, Bld: 83 mg/dL (ref 65–99)
Potassium: 3.9 mmol/L (ref 3.5–5.3)
Sodium: 138 mmol/L (ref 135–146)
Total Bilirubin: 0.2 mg/dL (ref 0.2–1.2)
Total Protein: 7.1 g/dL (ref 6.1–8.1)

## 2022-12-16 LAB — IGG, IGA, IGM
IgG (Immunoglobin G), Serum: 1175 mg/dL (ref 600–1640)
IgM, Serum: 94 mg/dL (ref 50–300)
Immunoglobulin A: 393 mg/dL — ABNORMAL HIGH (ref 47–310)

## 2022-12-21 ENCOUNTER — Other Ambulatory Visit: Payer: Self-pay | Admitting: Neurology

## 2022-12-25 ENCOUNTER — Ambulatory Visit (INDEPENDENT_AMBULATORY_CARE_PROVIDER_SITE_OTHER): Payer: Managed Care, Other (non HMO)

## 2022-12-25 VITALS — BP 130/77 | HR 92 | Temp 98.6°F | Resp 16 | Ht 62.0 in | Wt 178.6 lb

## 2022-12-25 DIAGNOSIS — G35 Multiple sclerosis: Secondary | ICD-10-CM

## 2022-12-25 MED ORDER — DIPHENHYDRAMINE HCL 25 MG PO CAPS
50.0000 mg | ORAL_CAPSULE | Freq: Once | ORAL | Status: AC
  Administered 2022-12-25: 50 mg via ORAL
  Filled 2022-12-25: qty 2

## 2022-12-25 MED ORDER — SODIUM CHLORIDE 0.9 % IV SOLN
600.0000 mg | Freq: Once | INTRAVENOUS | Status: AC
  Administered 2022-12-25: 600 mg via INTRAVENOUS
  Filled 2022-12-25: qty 20

## 2022-12-25 MED ORDER — ACETAMINOPHEN 325 MG PO TABS
650.0000 mg | ORAL_TABLET | Freq: Once | ORAL | Status: AC
  Administered 2022-12-25: 650 mg via ORAL
  Filled 2022-12-25: qty 2

## 2022-12-25 MED ORDER — METHYLPREDNISOLONE SODIUM SUCC 125 MG IJ SOLR
125.0000 mg | Freq: Once | INTRAMUSCULAR | Status: AC
  Administered 2022-12-25: 125 mg via INTRAVENOUS
  Filled 2022-12-25: qty 2

## 2022-12-25 NOTE — Progress Notes (Signed)
Diagnosis: Multiple Sclerosis  Provider:  Chilton Greathouse MD  Procedure: IV Infusion  IV Type: Peripheral, IV Location: R Forearm  Ocrevus (Ocrelizumab), Dose: 600mg   Infusion Start Time: 0930  Infusion Stop Time: 1330  Post Infusion IV Care: Observation period completed  Pt requested just 30 min observation . Discharge: Condition: Good, Destination: Home . AVS Declined  Performed by:  Adriana Mccallum, RN

## 2023-03-30 ENCOUNTER — Encounter: Payer: Self-pay | Admitting: Neurology

## 2023-04-06 ENCOUNTER — Ambulatory Visit: Payer: Managed Care, Other (non HMO) | Admitting: Neurology

## 2023-04-06 ENCOUNTER — Other Ambulatory Visit: Payer: Managed Care, Other (non HMO)

## 2023-05-27 ENCOUNTER — Telehealth: Payer: Self-pay | Admitting: Pharmacy Technician

## 2023-05-27 NOTE — Telephone Encounter (Signed)
 Patient insurance has termed.[facility]) She has an upcoming appt on 06/25/23. I have called and left a v/m for her to call and give updated insurance info. Awaiting a call back.

## 2023-06-15 ENCOUNTER — Ambulatory Visit: Payer: Self-pay | Admitting: Neurology

## 2023-06-15 ENCOUNTER — Encounter: Payer: Self-pay | Admitting: Neurology

## 2023-06-15 ENCOUNTER — Other Ambulatory Visit

## 2023-06-15 VITALS — BP 106/68 | HR 78 | Ht 62.0 in | Wt 177.0 lb

## 2023-06-15 DIAGNOSIS — G35 Multiple sclerosis: Secondary | ICD-10-CM

## 2023-06-15 MED ORDER — BACLOFEN 10 MG PO TABS: 10.0000 mg | ORAL_TABLET | Freq: Three times a day (TID) | ORAL | 3 refills | Status: AC

## 2023-06-15 NOTE — Progress Notes (Signed)
 Follow-up Visit   Date: 06/15/23    Peggy Banks MRN: [id number] DOB: 1980-03-31   Interim History: Peggy Banks is a 43 y.o. right-handed African American female with hypertension returning to the clinic for follow-up of multiple sclerosis.  The patient was accompanied to the clinic by self.    IMPRESSION: Relapsing remitting multiple sclerosis, (diagnosed 2018, symptom onset 2005).   Disease burden involves juxtacortical, periventricular, brainstem, cerebellum, and nearly every level of the cervical and thoracic spine.  Initially, she was on Tysabri  (2018 - 2024), however, after JCV became positive, she was switched to Ocrevus  (06/2022) and tolerating this well.   Clinically, she is has mild right knee hyperextension and spasticity, which has improved on baclofen .  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   She reports left posterior knee pain, upon standing which seems musculoskeletal in nature. Neurological exam is stable.   PLAN: 1.  Continue Ocrevus  every 6 months, she would prefer infusion on 6/19 2.  Check CBC with diff, CMP, and IgG level today and follow every 6 months due to high risk medication  3.  Check MRI brain and cervical spine wwo contrast 4.  Continue vitamin D  5000 units 5.  Continue baclofen  10mg  TID   Return to clinic in 6 months  -------------------------------------------------  UPDATE 05/13/2022:  She is here for follow-up visit.  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  She is asymptomatic and recent MRI brain and cervical spine shows stable disease, no progression.  Today, she is here to discuss alternative immunotherapy options and review results.  She denies any new symptoms.  UPDATE 08/18/2022:  She is here for follow-up visit.  She received her initial two doses of Ocrevus  in June and tolerated it well.  She denies any new neurological symptoms.  She feels that her walking is slightly better and she  does not hyperextend the knee as much. She does report increased fatigue lately, but feels some of this may be secondary to work-related stress.  She has contemplated looking for alternative options.   UPDATE 12/15/2022:  She is here for follow-up visit.  She has been doing well. Over the past few weeks, she has developed left posterior knee pain, which occurs upon standing.  Once she is walking, pain is relieved.  Sometimes, she has similar pain in the right hip.  No new weakness, numbness/tingling, or vision changes.  She is tolerating Ocrevus  well.   UPDATE 06/15/2023:  She is here for follow-up visit.  She was fired from [employer] in January and started working for [employer] in May.  Because of insurance lapse, she was unable to get her imaging and labs done sooner.  She denies any new neurological symptoms and overall has been doing well.  She is scheduled for Ocrevus  infusion on 6/20, but would like to see if she can get it on 6/19 as to avoid missing work.    Medications:   Current Outpatient Medications on File Prior to Visit  Medication Sig Dispense Refill   acetaminophen  (TYLENOL ) 500 MG tablet Take 1,000 mg by mouth every 6 (six) hours as needed for mild pain or headache (PRN infusion). PRE MED FOR MS INFUSION     baclofen  (LIORESAL ) 10 MG tablet TAKE 1 TABLET BY MOUTH 3 TIMES  DAILY 270 tablet 3   Cholecalciferol (VITAMIN D  PO) Take 5,000 Units by mouth.     ibuprofen  (ADVIL ) 800 MG tablet Take 1 tablet (800  mg total) by mouth every 8 (eight) hours as needed for moderate pain. 30 tablet 1   lisinopril-hydrochlorothiazide (PRINZIDE,ZESTORETIC) 10-12.5 MG tablet Take 1 tablet by mouth daily.     loratadine  (CLARITIN ) 10 MG tablet Take 10 mg by mouth daily.     Multiple Vitamin (MULTIVITAMIN) tablet Take 1 tablet by mouth daily.     ocrelizumab  (OCREVUS ) 300 MG/10ML injection Inject 20 mLs (600 mg total) into the vein every 6 (six) months. 20 mL 1   Omega-3 Fatty Acids (FISH OIL) 1360 MG CAPS Take  2 capsules by mouth daily.     rosuvastatin (CRESTOR) 10 MG tablet Take 10 mg by mouth at bedtime.     Semaglutide,0.25 or 0.5MG /DOS, (OZEMPIC, 0.25 OR 0.5 MG/DOSE,) 2 MG/1.5ML SOPN Inject 0.5 mg into the skin once a week.     No current facility-administered medications on file prior to visit.    Allergies:  Allergies  Allergen Reactions   Cefdinir Swelling    Vital Signs:  BP 106/68   Pulse 78   Ht 5\' 2"  (1.575 m)   Wt 177 lb (80.3 kg)   SpO2 100%   BMI 32.37 kg/m   Neurological Exam: MENTAL STATUS including orientation to time, place, person, recent and remote memory, attention span and concentration, language, and fund of knowledge is normal.  Speech is not dysarthric.  CRANIAL NERVES:   Pupils round and reactive to light.  Extraocular muscles intact.  Face is symmetric. Tongue is midline.    MOTOR:  Motor strength is 5/5 in all extremities, except right foot dorsiflexion 5-/5, eversion 5-/5, inversion 5-/5.  There mild RLE spasticity.  MSRs:  Right                                                    Left brachioradialis 3+  brachioradialis 2+  biceps 3+  biceps 2+  triceps 3+  triceps 2+  patellar 3+  patellar 3+  ankle jerk 3+  ankle jerk 2+   SENSORY: Vibration is intact at the knees and MCP bilaterally  COORDINATION/GAIT:  Gait is stable, minimal, if any, right knee hyperextension and trace right foot inversion.  Data: MRI brain and cervical spine wwo contrast 04/20/2020:   1. Extensive T2/FLAIR hyperintensity involving the supratentorial and infratentorial cerebral white matter, consistent with known history of multiple sclerosis. Overall, appearance is relatively stable from previous, with no definite new lesions or evidence for significant disease progression. Single punctate nodular focus of enhancement about a juxta cortical lesion at the posterior parasagittal right frontal lobe consistent with active demyelination. 2. Mildly advanced cerebral atrophy for  age, stable.   MRI CERVICAL SPINE IMPRESSION:   1. Extensive patchy and confluent signal abnormality throughout the cervical and visualized upper thoracic spinal cord, consistent with history of multiple sclerosis. No definite new lesions or evidence for significant disease progression. No active demyelination. 2. Small central disc protrusion at C5-6 without significant stenosis, stable.  MRI brain and cervical spine wwo contast 04/22/2021: 1. Unchanged distribution of white matter lesions of the brain and cervical spinal cord in a pattern consistent with multiple sclerosis. 2. No new or active demyelinating lesions. 3. Mildly accelerated volume loss for age, unchanged.   MRI brain and cervical spine wwo contrast 04/30/2022: 1. Similar sequela of chronic demyelination intracranially and in the cervical cord. No new  lesions identified and no evidence of active demyelination. 2. At C5-C6, similar central disc protrusion contacts and deforms the cord.  Labs 04/06/2022:  JCV positive 0.69*     Thank you for allowing me to participate in patient's care.  If I can answer any additional questions, I would be pleased to do so.    Sincerely,    Skyleigh Windle K. Lydia Sams, DO

## 2023-06-15 NOTE — Patient Instructions (Addendum)
 Check labs  Continue Ocrevus  infusion  MRI brain and cervical spine, Please give [facility] a call to schedule at [phone].

## 2023-06-16 ENCOUNTER — Ambulatory Visit: Payer: Self-pay | Admitting: Neurology

## 2023-06-16 NOTE — Telephone Encounter (Addendum)
 Auth Submission: APPROVED Site of care: Site of care: [facility] Payer: [employer] Medication & CPT/J Code(s) submitted: Ocrevus  Marlin Simmonds) [phone] Route of submission (phone, fax, portal): [phone] Phone #  Fax # Auth type: MEDICAL BENEFIT / [employer] Units/visits requested: OCREVUS  600MG  Q6MONTHS X 2 DOSEs 600 units has been approved  Reference number: [id number] Approval from: 06/16/23 to 06/14/24   Excell sheet and fyi flag has been removed. Called to verify this is under Medical benefit: Rep: Sandy-P 9:13a   CO-PAY PENDING: [employer]

## 2023-06-24 ENCOUNTER — Ambulatory Visit (INDEPENDENT_AMBULATORY_CARE_PROVIDER_SITE_OTHER)

## 2023-06-24 VITALS — BP 116/73 | HR 104 | Temp 97.3°F | Resp 20 | Ht 62.0 in | Wt 181.4 lb

## 2023-06-24 DIAGNOSIS — G35 Multiple sclerosis: Secondary | ICD-10-CM | POA: Diagnosis not present

## 2023-06-24 MED ORDER — ACETAMINOPHEN 325 MG PO TABS
650.0000 mg | ORAL_TABLET | Freq: Once | ORAL | Status: AC
  Administered 2023-06-24: 650 mg via ORAL
  Filled 2023-06-24: qty 2

## 2023-06-24 MED ORDER — METHYLPREDNISOLONE SODIUM SUCC 125 MG IJ SOLR
125.0000 mg | Freq: Once | INTRAMUSCULAR | Status: AC
  Administered 2023-06-24: 125 mg via INTRAVENOUS
  Filled 2023-06-24: qty 2

## 2023-06-24 MED ORDER — SODIUM CHLORIDE 0.9 % IV SOLN
600.0000 mg | Freq: Once | INTRAVENOUS | Status: AC
  Administered 2023-06-24: 600 mg via INTRAVENOUS
  Filled 2023-06-24: qty 20

## 2023-06-24 MED ORDER — DIPHENHYDRAMINE HCL 25 MG PO CAPS
50.0000 mg | ORAL_CAPSULE | Freq: Once | ORAL | Status: AC
  Administered 2023-06-24: 50 mg via ORAL
  Filled 2023-06-24: qty 2

## 2023-06-24 NOTE — Progress Notes (Signed)
 Diagnosis: Multiple Sclerosis  Provider:  Phyllis Breeze MD  Procedure: IV Infusion  IV Type: Peripheral, IV Location: L Antecubital  Ocrevus  (Ocrelizumab ), Dose  600 mg    Infusion Start Time: 1205  Infusion Stop Time: 1559  Post Infusion IV Care: Peripheral IV Discontinued  Discharge: Condition: Good, Destination: Home . AVS Provided  Performed by:  Stepahnie Campo G [place]Burchett, RN

## 2023-06-25 ENCOUNTER — Ambulatory Visit: Payer: Managed Care, Other (non HMO)

## 2023-11-01 ENCOUNTER — Other Ambulatory Visit: Payer: Self-pay | Admitting: Internal Medicine

## 2023-11-01 DIAGNOSIS — Z1231 Encounter for screening mammogram for malignant neoplasm of breast: Secondary | ICD-10-CM

## 2023-11-12 LAB — CBC WITH DIFFERENTIAL/PLATELET
Absolute Lymphocytes: 908 {cells}/uL (ref 850–3900)
Absolute Monocytes: 340 {cells}/uL (ref 200–950)
Basophils Absolute: 10 {cells}/uL (ref 0–200)
Basophils Relative: 0.3 %
Eosinophils Absolute: 10 {cells}/uL — ABNORMAL LOW (ref 15–500)
Eosinophils Relative: 0.3 %
HCT: 36.7 % (ref 35.0–45.0)
Hemoglobin: 11.6 g/dL — ABNORMAL LOW (ref 11.7–15.5)
MCH: 27 pg (ref 27.0–33.0)
MCHC: 31.6 g/dL — ABNORMAL LOW (ref 32.0–36.0)
MCV: 85.3 fL (ref 80.0–100.0)
MPV: 9.1 fL (ref 7.5–12.5)
Monocytes Relative: 10 %
Neutro Abs: 2132 {cells}/uL (ref 1500–7800)
Neutrophils Relative %: 62.7 %
Platelets: 311 Thousand/uL (ref 140–400)
RBC: 4.3 Million/uL (ref 3.80–5.10)
RDW: 14.3 % (ref 11.0–15.0)
Total Lymphocyte: 26.7 %
WBC: 3.4 Thousand/uL — ABNORMAL LOW (ref 3.8–10.8)

## 2023-11-12 LAB — COMPREHENSIVE METABOLIC PANEL WITH GFR
AG Ratio: 1.5 (calc) (ref 1.0–2.5)
ALT: 10 U/L (ref 6–29)
AST: 12 U/L (ref 10–30)
Albumin: 4.3 g/dL (ref 3.6–5.1)
Alkaline phosphatase (APISO): 38 U/L (ref 31–125)
BUN: 17 mg/dL (ref 7–25)
CO2: 28 mmol/L (ref 20–32)
Calcium: 9.6 mg/dL (ref 8.6–10.2)
Chloride: 101 mmol/L (ref 98–110)
Creat: 0.54 mg/dL (ref 0.50–0.99)
Globulin: 2.8 g/dL (ref 1.9–3.7)
Glucose, Bld: 89 mg/dL (ref 65–139)
Potassium: 4.5 mmol/L (ref 3.5–5.3)
Sodium: 137 mmol/L (ref 135–146)
Total Bilirubin: 0.3 mg/dL (ref 0.2–1.2)
Total Protein: 7.1 g/dL (ref 6.1–8.1)
eGFR: 118 mL/min/1.73m2 (ref >=60)

## 2023-11-12 LAB — IGG: IgG (Immunoglobin G), Serum: 1242 mg/dL (ref 600–1640)

## 2023-11-23 ENCOUNTER — Telehealth: Payer: Self-pay | Admitting: Neurology

## 2023-11-23 DIAGNOSIS — G35A Relapsing-remitting multiple sclerosis: Secondary | ICD-10-CM

## 2023-11-23 NOTE — Addendum Note (Signed)
 Addended by: DASIE RHODY A on: 11/23/2023 03:43 PM   Modules accepted: Orders

## 2023-11-23 NOTE — Telephone Encounter (Signed)
 Next available appointment for imaging is okay.  It is for surveillance imaging for MS.

## 2023-11-23 NOTE — Telephone Encounter (Signed)
 Called patient and informed her that I have reordered her MRI's and [facility] will contact her once her prior authorization has been completed for scheduling.   Patient wanted to know if she needs labs done as well. I Informed patient that per last visit Dr. Tobie wants to Check CBC with diff, CMP, and IgG level every 6 months due to high risk medication. Informed patient I will confirm this and contact her back.

## 2023-11-23 NOTE — Telephone Encounter (Signed)
 Pt changed Insurance will need new order with new insurance MR CERVICAL SPINE W WO CONTRAST MR BRAIN W WO CONTRAST Contact Pt with next steps

## 2023-11-24 ENCOUNTER — Other Ambulatory Visit: Payer: Self-pay | Admitting: Internal Medicine

## 2023-11-24 ENCOUNTER — Ambulatory Visit
Admission: RE | Admit: 2023-11-24 | Discharge: 2023-11-24 | Disposition: A | Discharge status: Home / Self Care | Source: Ambulatory Visit | Attending: Internal Medicine | Admitting: Internal Medicine

## 2023-11-24 DIAGNOSIS — Z1231 Encounter for screening mammogram for malignant neoplasm of breast: Secondary | ICD-10-CM

## 2023-11-24 DIAGNOSIS — N6342 Unspecified lump in left breast, subareolar: Secondary | ICD-10-CM

## 2023-11-24 NOTE — Telephone Encounter (Signed)
 Called patient and informed her of Dr. Anthony response per below. Patient will wait until her appt 12/20/23 to have her labs drawn. Patient had no further questions or concerns.

## 2023-12-09 ENCOUNTER — Other Ambulatory Visit

## 2023-12-09 ENCOUNTER — Inpatient Hospital Stay: Admission: RE | Admit: 2023-12-09 | Discharge: 2023-12-09 | Attending: Neurology

## 2023-12-09 DIAGNOSIS — G35A Relapsing-remitting multiple sclerosis: Secondary | ICD-10-CM

## 2023-12-09 MED ORDER — GADOPICLENOL 0.5 MMOL/ML IV SOLN
8.0000 mL | Freq: Once | INTRAVENOUS | Status: AC | PRN
  Administered 2023-12-09: 8 mL via INTRAVENOUS

## 2023-12-16 ENCOUNTER — Ambulatory Visit: Payer: Self-pay | Admitting: Neurology

## 2023-12-20 ENCOUNTER — Encounter: Payer: Self-pay | Admitting: Neurology

## 2023-12-20 ENCOUNTER — Ambulatory Visit: Admitting: Neurology

## 2023-12-20 ENCOUNTER — Other Ambulatory Visit

## 2023-12-20 VITALS — BP 109/74 | HR 84 | Ht 62.0 in | Wt 188.0 lb

## 2023-12-20 DIAGNOSIS — G35A Relapsing-remitting multiple sclerosis: Secondary | ICD-10-CM

## 2023-12-20 NOTE — Patient Instructions (Signed)
 Check labs

## 2023-12-20 NOTE — Progress Notes (Signed)
 Follow-up Visit   Date: 12/20/2023    Peggy Banks MRN: [id number] DOB: 1980/09/10   Interim History: Peggy Banks is a 43 y.o. right-handed African American female with hypertension returning to the clinic for follow-up of multiple sclerosis.  The patient was accompanied to the clinic by self.    IMPRESSION: Relapsing remitting multiple sclerosis (diagnosed 2018, symptom onset 2005).  Disease burden involves juxtacortical, periventricular, brainstem, cerebellum, and nearly every level of the cervical and thoracic spine.  Initially, she was on Tysabri  (2018 - 2024), however, after JCV became positive, she was switched to Ocrevus  (06/2022) and tolerating this well.   Clinically, she is has mild right knee hyperextension and spasticity, which has improved on baclofen .  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  Neurological exam is stable.   MRI brain and cervical spine reviewed and is stable, no new lesions.  PLAN: 1.  Continue Ocrevus  every 6 months   2.  Check CBC diff, CMC, and IgG level due to high risk medication  3.  Continue vitamin D  5000 units 4.  Continue baclofen  10mg  TID 5.  Will monitor imaging in 2 years, unless she develops new symptoms  Return to clinic in 6 months  -------------------------------------------------  UPDATE 05/13/2022:  She is here for follow-up visit.  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  She is asymptomatic and recent MRI brain and cervical spine shows stable disease, no progression.  Today, she is here to discuss alternative immunotherapy options and review results.  She denies any new symptoms.  UPDATE 08/18/2022:  She is here for follow-up visit.  She received her initial two doses of Ocrevus  in June and tolerated it well.  She denies any new neurological symptoms.  She feels that her walking is slightly better and she does not hyperextend the knee as much. She does report increased  fatigue lately, but feels some of this may be secondary to work-related stress.  She has contemplated looking for alternative options.   UPDATE 12/15/2022:  She is here for follow-up visit.  She has been doing well. Over the past few weeks, she has developed left posterior knee pain, which occurs upon standing.  Once she is walking, pain is relieved.  Sometimes, she has similar pain in the right hip.  No new weakness, numbness/tingling, or vision changes.  She is tolerating Ocrevus  well.   UPDATE 06/15/2023:  She is here for follow-up visit.  She was fired from [employer] in January and started working in [employer] for [employer] in May.  Because of insurance lapse, she was unable to get her imaging and labs done sooner.  She denies any new neurological symptoms and overall has been doing well.  She is scheduled for Ocrevus  infusion on 6/20, but would like to see if she can get it on 6/19 as to avoid missing work.   UPDATE 12/20/2023:  She is here for follow-up visit.  She continues to get Ocrevus  every 6 months and is tolerating this well.  Recent MRI brain and cervical spine shows stable disease, no new lesions.  She continues to take baclofen  10mg  2-3 times daily.  She is feeling well and denies any new numbness/tingling, weakness, or vision changes.  No interval falls.    Medications:   Current Outpatient Medications on File Prior to Visit  Medication Sig Dispense Refill   acetaminophen  (TYLENOL ) 500 MG tablet Take 1,000 mg by mouth every 6 (six) hours  as needed for mild pain or headache (PRN infusion). PRE MED FOR MS INFUSION     baclofen  (LIORESAL ) 10 MG tablet Take 1 tablet (10 mg total) by mouth 3 (three) times daily. 270 tablet 3   Cholecalciferol (VITAMIN D  PO) Take 5,000 Units by mouth.     ibuprofen  (ADVIL ) 800 MG tablet Take 1 tablet (800 mg total) by mouth every 8 (eight) hours as needed for moderate pain. 30 tablet 1   lisinopril-hydrochlorothiazide (PRINZIDE,ZESTORETIC) 10-12.5 MG tablet Take 1 tablet  by mouth daily.     loratadine  (CLARITIN ) 10 MG tablet Take 10 mg by mouth daily.     Multiple Vitamin (MULTIVITAMIN) tablet Take 1 tablet by mouth daily.     ocrelizumab  (OCREVUS ) 300 MG/10ML injection Inject 20 mLs (600 mg total) into the vein every 6 (six) months. 20 mL 1   Omega-3 Fatty Acids (FISH OIL) 1360 MG CAPS Take 2 capsules by mouth daily.     rosuvastatin (CRESTOR) 10 MG tablet Take 10 mg by mouth at bedtime.     Semaglutide,0.25 or 0.5MG /DOS, (OZEMPIC, 0.25 OR 0.5 MG/DOSE,) 2 MG/1.5ML SOPN Inject 0.5 mg into the skin once a week.     No current facility-administered medications on file prior to visit.    Allergies:  Allergies  Allergen Reactions   Cefdinir Swelling    Vital Signs:  BP 109/74   Pulse 84   Ht 5' 2 (1.575 m)   Wt 188 lb (85.3 kg)   SpO2 100%   BMI 34.39 kg/m   Neurological Exam: MENTAL STATUS including orientation to time, place, person, recent and remote memory, attention span and concentration, language, and fund of knowledge is normal.  Speech is not dysarthric.  CRANIAL NERVES:   Pupils round and reactive to light.  Extraocular muscles intact.  Face is symmetric.   MOTOR:  Motor strength is 5/5 in all extremities, except right foot dorsiflexion 5-/5, eversion 5-/5, inversion 5-/5.  There mild RLE spasticity.  MSRs:  Right                                                    Left brachioradialis 3+  brachioradialis 2+  biceps 3+  biceps 2+  triceps 3+  triceps 2+  patellar 3+  patellar 3+  ankle jerk 3+  ankle jerk 2+   SENSORY: Vibration is intact at the knees and MCP bilaterally  COORDINATION/GAIT:  Gait is stable, mild right knee hyperextension and trace right foot inversion.  Data: MRI brain and cervical spine 12/16/2023: 1. No pre-contrast T1-weighted images were obtained, limiting assessment for  enhancement and evaluation of marrow signal. 2. Since 04/30/2022, no significant change multilevel short-segment cervical spinal cord  lesions in keeping with reported history of demyelinating disease. No new lesion. No abnormal intradural or spinal cord enhancement. 3. C4-C5, increased central disc protrusion indenting the ventral thecal sac without significant spinal stenosis. 4. C5-C6, decreased/ resolved central disc protrusion and decreased indentation of the ventral thecal sac and spinal cord.  1. Unchanged extensive white matter disease consistent with multiple sclerosis.Chronic small focus of enhancement associated with a juxtacortical lesion in the high parasagittal right frontal lobe. No new enhancing lesions.  MRI brain and cervical spine wwo contast 04/22/2021: 1. Unchanged distribution of white matter lesions of the brain and cervical spinal cord in a pattern consistent  with multiple sclerosis. 2. No new or active demyelinating lesions. 3. Mildly accelerated volume loss for age, unchanged.   MRI brain and cervical spine wwo contrast 04/30/2022: 1. Similar sequela of chronic demyelination intracranially and in the cervical cord. No new lesions identified and no evidence of active demyelination. 2. At C5-C6, similar central disc protrusion contacts and deforms the cord.  Labs 04/06/2022:  JCV positive 0.69*     Thank you for allowing me to participate in patient's care.  If I can answer any additional questions, I would be pleased to do so.    Sincerely,    Candra Wegner K. Tobie, DO

## 2023-12-21 ENCOUNTER — Ambulatory Visit: Payer: Self-pay | Admitting: Neurology

## 2023-12-21 LAB — CBC WITH DIFFERENTIAL/PLATELET
Absolute Lymphocytes: 1368 {cells}/uL (ref 850–3900)
Absolute Monocytes: 353 {cells}/uL (ref 200–950)
Basophils Absolute: 19 {cells}/uL (ref 0–200)
Basophils Relative: 0.5 %
Eosinophils Absolute: 11 {cells}/uL — ABNORMAL LOW (ref 15–500)
Eosinophils Relative: 0.3 %
HCT: 34.7 % — ABNORMAL LOW (ref 35.9–46.0)
Hemoglobin: 10.9 g/dL — ABNORMAL LOW (ref 11.7–15.5)
MCH: 27 pg (ref 27.0–33.0)
MCHC: 31.4 g/dL — ABNORMAL LOW (ref 31.6–35.4)
MCV: 86.1 fL (ref 81.4–101.7)
MPV: 9.2 fL (ref 7.5–12.5)
Monocytes Relative: 9.3 %
Neutro Abs: 2048 {cells}/uL (ref 1500–7800)
Neutrophils Relative %: 53.9 %
Platelets: 387 Thousand/uL (ref 140–400)
RBC: 4.03 Million/uL (ref 3.80–5.10)
RDW: 12.8 % (ref 11.0–15.0)
Total Lymphocyte: 36 %
WBC: 3.8 Thousand/uL (ref 3.8–10.8)

## 2023-12-21 LAB — COMPREHENSIVE METABOLIC PANEL WITH GFR
AG Ratio: 1.7 (calc) (ref 1.0–2.5)
ALT: 10 U/L (ref 6–29)
AST: 13 U/L (ref 10–30)
Albumin: 4.5 g/dL (ref 3.6–5.1)
Alkaline phosphatase (APISO): 34 U/L (ref 31–125)
BUN: 12 mg/dL (ref 7–25)
CO2: 29 mmol/L (ref 20–32)
Calcium: 9.8 mg/dL (ref 8.6–10.2)
Chloride: 101 mmol/L (ref 98–110)
Creat: 0.59 mg/dL (ref 0.50–0.99)
Globulin: 2.6 g/dL (ref 1.9–3.7)
Glucose, Bld: 78 mg/dL (ref 65–99)
Potassium: 3.9 mmol/L (ref 3.5–5.3)
Sodium: 139 mmol/L (ref 135–146)
Total Bilirubin: 0.3 mg/dL (ref 0.2–1.2)
Total Protein: 7.1 g/dL (ref 6.1–8.1)
eGFR: 115 mL/min/1.73m2 (ref >=60)

## 2023-12-21 LAB — IGG: IgG (Immunoglobin G), Serum: 1074 mg/dL (ref 600–1640)

## 2023-12-22 ENCOUNTER — Inpatient Hospital Stay: Admission: RE | Admit: 2023-12-22 | Discharge: 2023-12-22 | Attending: Internal Medicine | Admitting: Internal Medicine

## 2023-12-22 DIAGNOSIS — N6342 Unspecified lump in left breast, subareolar: Secondary | ICD-10-CM

## 2023-12-27 ENCOUNTER — Ambulatory Visit

## 2023-12-27 VITALS — BP 125/77 | HR 106 | Temp 98.5°F | Resp 18 | Ht 62.0 in | Wt 186.0 lb

## 2023-12-27 DIAGNOSIS — G35A Relapsing-remitting multiple sclerosis: Secondary | ICD-10-CM | POA: Diagnosis not present

## 2023-12-27 MED ORDER — DIPHENHYDRAMINE HCL 25 MG PO CAPS
50.0000 mg | ORAL_CAPSULE | Freq: Once | ORAL | Status: AC
  Administered 2023-12-27: 50 mg via ORAL
  Filled 2023-12-27: qty 2

## 2023-12-27 MED ORDER — METHYLPREDNISOLONE SODIUM SUCC 125 MG IJ SOLR
125.0000 mg | Freq: Once | INTRAMUSCULAR | Status: AC
  Administered 2023-12-27: 125 mg via INTRAVENOUS
  Filled 2023-12-27: qty 2

## 2023-12-27 MED ORDER — SODIUM CHLORIDE 0.9 % IV SOLN
600.0000 mg | Freq: Once | INTRAVENOUS | Status: AC
  Administered 2023-12-27: 600 mg via INTRAVENOUS
  Filled 2023-12-27: qty 20

## 2023-12-27 MED ORDER — ACETAMINOPHEN 325 MG PO TABS
650.0000 mg | ORAL_TABLET | Freq: Once | ORAL | Status: AC
  Administered 2023-12-27: 650 mg via ORAL
  Filled 2023-12-27: qty 2

## 2023-12-27 NOTE — Progress Notes (Signed)
 Diagnosis: Multiple Sclerosis  Provider:  Mannam, Praveen MD  Procedure: IV Infusion  IV Type: Peripheral, IV Location: L Antecubital  Ocrevus  (Ocrelizumab ), Dose: 600 mg  Infusion Start Time: 0916  Infusion Stop Time: 1308  Post Infusion IV Care: Observation period completed30 minute observation per patient request.  Discharge: Condition: Good, Destination: Home . AVS Declined  Performed by:  Rocky FORBES Sar, RN

## 2024-06-19 ENCOUNTER — Ambulatory Visit: Payer: Self-pay | Admitting: Neurology

## 2024-06-26 ENCOUNTER — Ambulatory Visit
